# Patient Record
Sex: Female | Born: 1945 | Race: White | Hispanic: No | Marital: Married | Smoking: Never smoker
Health system: Southern US, Community
[De-identification: ages and names within clinical notes are randomized; demographics above are authoritative.]

## PROBLEM LIST (undated history)

## (undated) DIAGNOSIS — I10 Essential (primary) hypertension: Secondary | ICD-10-CM

## (undated) DIAGNOSIS — C4490 Unspecified malignant neoplasm of skin, unspecified: Secondary | ICD-10-CM

## (undated) DIAGNOSIS — M1990 Unspecified osteoarthritis, unspecified site: Secondary | ICD-10-CM

## (undated) DIAGNOSIS — Z9889 Other specified postprocedural states: Secondary | ICD-10-CM

## (undated) DIAGNOSIS — F419 Anxiety disorder, unspecified: Secondary | ICD-10-CM

## (undated) DIAGNOSIS — E119 Type 2 diabetes mellitus without complications: Secondary | ICD-10-CM

## (undated) DIAGNOSIS — E7800 Pure hypercholesterolemia, unspecified: Secondary | ICD-10-CM

## (undated) DIAGNOSIS — R112 Nausea with vomiting, unspecified: Secondary | ICD-10-CM

## (undated) HISTORY — PX: SHOULDER SURGERY: SHX246

## (undated) HISTORY — DX: Essential (primary) hypertension: I10

## (undated) HISTORY — PX: NECK SURGERY: SHX720

## (undated) HISTORY — PX: ABDOMINAL HYSTERECTOMY: SHX81

## (undated) HISTORY — PX: BREAST BIOPSY: SHX20

## (undated) HISTORY — PX: BACK SURGERY: SHX140

## (undated) HISTORY — DX: Anxiety disorder, unspecified: F41.9

## (undated) HISTORY — DX: Pure hypercholesterolemia, unspecified: E78.00

## (undated) HISTORY — DX: Type 2 diabetes mellitus without complications: E11.9

---

## 1997-08-08 ENCOUNTER — Other Ambulatory Visit: Admission: RE | Admit: 1997-08-08 | Discharge: 1997-08-08 | Payer: Self-pay | Admitting: Obstetrics and Gynecology

## 1998-02-06 ENCOUNTER — Encounter: Admission: RE | Admit: 1998-02-06 | Discharge: 1998-05-07 | Payer: Self-pay | Admitting: *Deleted

## 1998-08-11 ENCOUNTER — Other Ambulatory Visit: Admission: RE | Admit: 1998-08-11 | Discharge: 1998-08-11 | Payer: Self-pay | Admitting: Obstetrics and Gynecology

## 1999-02-06 ENCOUNTER — Encounter: Admission: RE | Admit: 1999-02-06 | Discharge: 1999-02-06 | Payer: Self-pay | Admitting: General Surgery

## 1999-02-06 ENCOUNTER — Encounter: Payer: Self-pay | Admitting: General Surgery

## 1999-02-18 ENCOUNTER — Encounter: Admission: RE | Admit: 1999-02-18 | Discharge: 1999-03-11 | Payer: Self-pay | Admitting: Orthopedic Surgery

## 1999-04-26 ENCOUNTER — Encounter: Payer: Self-pay | Admitting: Emergency Medicine

## 1999-04-26 ENCOUNTER — Emergency Department (HOSPITAL_COMMUNITY): Admission: EM | Admit: 1999-04-26 | Discharge: 1999-04-26 | Payer: Self-pay | Admitting: Emergency Medicine

## 1999-08-26 ENCOUNTER — Other Ambulatory Visit: Admission: RE | Admit: 1999-08-26 | Discharge: 1999-08-26 | Payer: Self-pay | Admitting: Obstetrics and Gynecology

## 2000-02-08 ENCOUNTER — Encounter: Admission: RE | Admit: 2000-02-08 | Discharge: 2000-02-08 | Payer: Self-pay | Admitting: General Surgery

## 2000-02-08 ENCOUNTER — Encounter: Payer: Self-pay | Admitting: General Surgery

## 2000-02-23 ENCOUNTER — Encounter: Admission: RE | Admit: 2000-02-23 | Discharge: 2000-02-23 | Payer: Self-pay | Admitting: General Surgery

## 2000-02-23 ENCOUNTER — Encounter: Payer: Self-pay | Admitting: General Surgery

## 2000-10-28 ENCOUNTER — Other Ambulatory Visit: Admission: RE | Admit: 2000-10-28 | Discharge: 2000-10-28 | Payer: Self-pay | Admitting: Obstetrics and Gynecology

## 2001-02-13 ENCOUNTER — Encounter: Payer: Self-pay | Admitting: General Surgery

## 2001-02-13 ENCOUNTER — Encounter: Admission: RE | Admit: 2001-02-13 | Discharge: 2001-02-13 | Payer: Self-pay | Admitting: General Surgery

## 2001-09-05 ENCOUNTER — Other Ambulatory Visit: Admission: RE | Admit: 2001-09-05 | Discharge: 2001-09-05 | Payer: Self-pay | Admitting: Obstetrics and Gynecology

## 2002-03-05 ENCOUNTER — Encounter: Admission: RE | Admit: 2002-03-05 | Discharge: 2002-03-05 | Payer: Self-pay | Admitting: General Surgery

## 2002-03-05 ENCOUNTER — Encounter: Payer: Self-pay | Admitting: General Surgery

## 2002-09-11 ENCOUNTER — Other Ambulatory Visit: Admission: RE | Admit: 2002-09-11 | Discharge: 2002-09-11 | Payer: Self-pay | Admitting: Obstetrics and Gynecology

## 2003-09-12 ENCOUNTER — Other Ambulatory Visit: Admission: RE | Admit: 2003-09-12 | Discharge: 2003-09-12 | Payer: Self-pay | Admitting: Obstetrics and Gynecology

## 2003-09-24 ENCOUNTER — Encounter: Admission: RE | Admit: 2003-09-24 | Discharge: 2003-09-24 | Payer: Self-pay | Admitting: Obstetrics and Gynecology

## 2004-09-16 ENCOUNTER — Other Ambulatory Visit: Admission: RE | Admit: 2004-09-16 | Discharge: 2004-09-16 | Payer: Self-pay | Admitting: Obstetrics and Gynecology

## 2004-10-12 ENCOUNTER — Encounter: Admission: RE | Admit: 2004-10-12 | Discharge: 2004-10-12 | Payer: Self-pay | Admitting: Obstetrics and Gynecology

## 2004-12-22 ENCOUNTER — Inpatient Hospital Stay (HOSPITAL_BASED_OUTPATIENT_CLINIC_OR_DEPARTMENT_OTHER): Admission: RE | Admit: 2004-12-22 | Discharge: 2004-12-22 | Payer: Self-pay | Admitting: Cardiology

## 2005-08-29 ENCOUNTER — Emergency Department (HOSPITAL_COMMUNITY): Admission: EM | Admit: 2005-08-29 | Discharge: 2005-08-29 | Payer: Self-pay | Admitting: Emergency Medicine

## 2005-09-24 ENCOUNTER — Other Ambulatory Visit: Admission: RE | Admit: 2005-09-24 | Discharge: 2005-09-24 | Payer: Self-pay | Admitting: Obstetrics and Gynecology

## 2005-11-18 ENCOUNTER — Encounter: Admission: RE | Admit: 2005-11-18 | Discharge: 2005-11-18 | Payer: Self-pay | Admitting: General Surgery

## 2006-04-20 ENCOUNTER — Ambulatory Visit (HOSPITAL_COMMUNITY): Admission: RE | Admit: 2006-04-20 | Discharge: 2006-04-21 | Payer: Self-pay | Admitting: Obstetrics and Gynecology

## 2006-04-22 ENCOUNTER — Inpatient Hospital Stay (HOSPITAL_COMMUNITY): Admission: AD | Admit: 2006-04-22 | Discharge: 2006-04-22 | Payer: Self-pay | Admitting: Obstetrics and Gynecology

## 2007-06-07 ENCOUNTER — Encounter: Admission: RE | Admit: 2007-06-07 | Discharge: 2007-06-07 | Payer: Self-pay | Admitting: Internal Medicine

## 2007-07-31 ENCOUNTER — Emergency Department (HOSPITAL_COMMUNITY): Admission: EM | Admit: 2007-07-31 | Discharge: 2007-07-31 | Payer: Self-pay | Admitting: Emergency Medicine

## 2008-11-28 ENCOUNTER — Encounter: Admission: RE | Admit: 2008-11-28 | Discharge: 2008-11-28 | Payer: Self-pay | Admitting: Internal Medicine

## 2010-01-19 ENCOUNTER — Encounter: Admission: RE | Admit: 2010-01-19 | Discharge: 2010-01-19 | Payer: Self-pay | Admitting: Internal Medicine

## 2010-08-21 NOTE — Op Note (Signed)
[name], Virginia Russell             ACCOUNT NO.:  [id number]   MEDICAL RECORD NO.:  [id number]          PATIENT TYPE:  AMB   LOCATION:  SDC                           FACILITY:  [facility]   PHYSICIAN:  Dois Davenport A. Rivard, M.D. DATE OF BIRTH:  [birth year]   DATE OF PROCEDURE:  04/20/2006  DATE OF DISCHARGE:                               OPERATIVE REPORT   PREOPERATIVE DIAGNOSIS:  Cystocele with urinary stress incontinence.   POSTOPERATIVE DIAGNOSIS:  Cystocele with urinary stress incontinence.   ANESTHESIA:  General.   PROCEDURE:  Anterior repair with urethropexy of Kelly-Kennedy.   SURGEON:  Crist Fat. Rivard, M.D.   ASSISTANTMarquis Lunch. Powell. P.A.   Bronwen Betters BLOOD LOSS:  Less than 100 mL.   PROCEDURE:  After being informed of the planned procedure with possible  complications including bleeding, infection, injury to bladder or  ureters, informed consent was obtained.  The patient is taken to OR #3,  given general anesthesia with endotracheal intubation without  complication.  She is placed in the lithotomy position, prepped and  draped in a sterile fashion, and a Foley catheter is inserted in her  bladder.  GYN exam reveals a complete cystocele, no rectocele, no  enterocele, and no colpocele.  The vaginal vault is grasped with Allis  forceps and the anterior vaginal mucosa is infiltrated with lidocaine  1%, epinephrine 1:200,000, 20 mL.  We can now incise the vaginal mucosa  transversely with knife.  Each edges is grasped with Allis forceps, and  we now undermine the anterior vaginal wall from the prevesical fascia  using Strully scissors in the medial fashion up until 1 cm from the  urethra.  The edges of the vaginal mucosa are held with Allis forceps,  which allows [place] now to sharply and bluntly dissect the prevesical fascia  from the anterior vaginal wall.  This is done until the cystocele is  completely freed.  Using 2-0 Vicryl, we can now plicate under the  urethra with two   Kelly-Kennedy sutures, correcting the urethrovesical  angle to a 90-degree.  We then correct the cystocele in multiple layers  using U stitches of 2-0 Vicryl.  This is done until the whole cystocele  was completely corrected.  Excessive vaginal mucosa is excised and the  vaginal mucosa is then closed with figure-of-eight stitches of 3-0  Vicryl.  The vagina is then packed with two vaginal packs and Estrace  cream.  Instrument and sponge count is complete x2.  Estimated blood  loss is less than 100 mL.  The procedure is very well-tolerated by the  patient, who is taken to recovery room in a well and stable condition.      Crist Fat Rivard, M.D.  Electronically Signed     SAR/MEDQ  D:  04/20/2006  T:  04/20/2006  Job:  [id number]

## 2010-08-21 NOTE — H&P (Signed)
Virginia Russell, Virginia Russell             ACCOUNT NO.:  [id number]   MEDICAL RECORD NO.:  [id number]          PATIENT TYPE:  AMB   LOCATION:  SDC                           [facility]ILITY:  [facility]   PHYSICIAN:  Dois Davenport A. Rivard, M.D. DATE OF BIRTH:  April 18, 1945   DATE OF ADMISSION:  04/20/2006  DATE OF DISCHARGE:                              HISTORY & PHYSICAL   REASON FOR ADMISSION:  Symptomatic cystocele.   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  She also reports occasional incomplete emptying as well as  nycturia varying from 2-3 episodes per night.  She denies any dysuria or  hematuria.   Her last Pap smear in June 2007, was normal.  Her last mammogram in  November 2007, was normal.  A bone density in June 2007, revealed  osteopenia.   CURRENT MEDICATIONS:  1. Metformin 750 mg b.i.d.  2. Celebrex 200 mg p.o. daily.  3. Effexor 37.5 mg daily.  4. Lipitor daily.  5. Diovan 80 mg daily.  6. Premarin 0.625 mg daily.   ALLERGIES:  CODEINE with GI upset.   REVIEW OF SYSTEMS:  CONSTITUTIONAL:  Negative.  HEENT:  Negative.  CARDIOVASCULAR:  Negative.  GENITOURINARY:  Please see HPI.  GASTROINTESTINAL:  Negative.  NEUROLOGICAL:  Negative.   PAST MEDICAL HISTORY:  1. Gravida 3, para 3, status post three spontaneous vaginal      deliveries.  2. Total abdominal hysterectomy in 1978 for fibroids.  3. Breast lump biopsy, benign.  4. Back surgery x2.  5. Neck surgery.  6. Hypercholesterolemia.  7. Diabetes.  8. Hypertension.   SOCIAL HISTORY:  Married, nonsmoker.  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   FAMILY HISTORY:  Mother is a survivor of breast  cancer.   PHYSICAL EXAMINATION:  VITAL SIGNS:  Current weight is 152 pounds for a  height of 5 feet 3/4 inches.  Blood pressure 134/92.  HEENT:  Normal.  NECK:  Thyroid not enlarged.  CHEST:  Clear.  CARDIOVASCULAR:  Regular rate and rhythm.  BREASTS:  Normal.  BACK:  No CVA tenderness.  ABDOMEN:  No tenderness, masses, or hepatosplenomegaly.  EXTREMITIES:  Negative.  NEUROLOGIC:  Within normal limits.  GYNECOLOGICAL:  Normal external genitalia.  Vaginal exam reveals a  complete cystocele 4/4 with a negative stress test.  Absence of  colpocele, absence of rectocele, absence of enterocele.  Adnexa are felt  and negative.  Rectal exam is negative.   ASSESSMENT:  Symptomatic cystocele in patient desiring surgical  correction.   PLAN:  The patient is scheduled for an anterior repair on April 20, 2006.  The procedure has been thoroughly reviewed with the patient as  well as possible complications including, but not limited to infection,  bleeding, injury to other organs such as bladder or ureters  as well as  postoperative urinary retention or postoperative urinary incontinence.  Postoperative course as well as hospital stay has been reviewed and the  patient is aware she may have to go home with an indwelling Foley  catheter.  She voices understanding.      Crist Fat Rivard, M.D.  Electronically Signed     SAR/MEDQ  D:  04/15/2006  T:  04/15/2006  Job:  [id number]

## 2010-08-21 NOTE — Cardiovascular Report (Signed)
[name], Virginia Russell             ACCOUNT NO.:  [id number]   MEDICAL RECORD NO.:  [id number]          PATIENT TYPE:  OIB   LOCATION:  1961                         FACILITY:  [facility]   PHYSICIAN:  Colleen Can. Deborah Chalk, M.D.DATE OF BIRTH:  Sep 17, 1945   DATE OF PROCEDURE:  12/22/2004  DATE OF DISCHARGE:                              CARDIAC CATHETERIZATION   PROCEDURE:  Left heart catheterization with selective coronary angiography  and left ventricular angiography.   TYPE AND SITE OF ENTRY:  Percutaneous right femoral artery.   CATHETERS:  1.  4-French 4-curved Judkins right and left coronary catheters.  2.  4-French pigtail ventriculographic catheter.   CONTRAST MATERIAL:  Omnipaque.   MEDICATIONS GIVEN PRIOR TO PROCEDURE:  Valium 10 mg.   MEDICATIONS GIVING DURING THE PROCEDURE:  None.   COMMENTS:  The patient tolerated the procedure well.   HEMODYNAMIC DATA:  1.  Aortic pressure:  171/89.  2.  LV pressure:  182/20. After rebalancing, end-diastolic pressure fell to      12-18.   ANGIOGRAPHIC DATA:  1.  LEFT MAIN CORONARY ARTERY:  Normal.  2.  LEFT ANTERIOR DESCENDING:  The left anterior descending is a small to      moderate size vessel that ends on the anterior wall. There is a large      diagonal vessel. There was 30-40% narrowing leading up to the      bifurcation. After bifurcation there was at least  50-60% narrowing.      There does not appear to be any area that is more severe in the      segmental disease than 50-60% luminal reduction,  although it is a      lesion that is over length.  3.  LEFT CIRCUMFLEX: The left circumflex is a moderate size system. It      continues as a high obtuse marginal and a bifurcating second obtuse      marginal. It is essentially normal.  4.  RIGHT CORONARY ARTERY: The right coronary artery is a large, dominant      system. It has minimal irregularities. It has three large branches on      the inferior wall.   LEFT VENTRICULAR  ANGIOGRAM:  Performed in the RAO position. The overall  cardiac size and silhouette are normal. The global ejection fraction was  60%. Regional wall motion appears to be normal. There was no mitral  regurgitation.   OVERALL IMPRESSION:  1.  Normal left ventricular function, but with elevated end systolic      pressures.  2.  Moderate coronary atherosclerosis in the left anterior descending, in a      segmental nature; with minimal coronary atherosclerosis otherwise.  3.  No detectable gradient across the aortic valve.   DISCUSSION:  Management issues for now probably will best stem around  measures to reduce left ventricular filling pressures. There does not appear  to be significant obstructive disease that would warrant either percutaneous  intervention or bypass surgery.      Colleen Can. Deborah Chalk, M.D.  Electronically Signed     SNT/MEDQ  D:  12/22/2004  T:  12/22/2004  Job:  [id number]

## 2011-02-18 ENCOUNTER — Other Ambulatory Visit: Payer: Self-pay | Admitting: Internal Medicine

## 2011-02-18 DIAGNOSIS — Z1231 Encounter for screening mammogram for malignant neoplasm of breast: Secondary | ICD-10-CM

## 2011-03-03 ENCOUNTER — Ambulatory Visit
Admission: RE | Admit: 2011-03-03 | Discharge: 2011-03-03 | Disposition: A | Discharge status: Home / Self Care | Payer: Medicare Other | Source: Ambulatory Visit | Attending: Internal Medicine | Admitting: Internal Medicine

## 2011-03-03 DIAGNOSIS — Z1231 Encounter for screening mammogram for malignant neoplasm of breast: Secondary | ICD-10-CM

## 2012-02-21 ENCOUNTER — Other Ambulatory Visit: Payer: Self-pay | Admitting: Internal Medicine

## 2012-02-21 DIAGNOSIS — Z1231 Encounter for screening mammogram for malignant neoplasm of breast: Secondary | ICD-10-CM

## 2012-03-30 ENCOUNTER — Ambulatory Visit: Payer: Medicare Other

## 2012-10-31 ENCOUNTER — Ambulatory Visit
Admission: RE | Admit: 2012-10-31 | Discharge: 2012-10-31 | Disposition: A | Discharge status: Home / Self Care | Payer: Medicare Other | Source: Ambulatory Visit | Attending: Internal Medicine | Admitting: Internal Medicine

## 2012-10-31 DIAGNOSIS — Z1231 Encounter for screening mammogram for malignant neoplasm of breast: Secondary | ICD-10-CM

## 2012-12-28 ENCOUNTER — Encounter: Payer: Self-pay | Admitting: Gynecology

## 2012-12-28 ENCOUNTER — Other Ambulatory Visit (HOSPITAL_COMMUNITY)
Admission: RE | Admit: 2012-12-28 | Discharge: 2012-12-28 | Disposition: A | Discharge status: Home / Self Care | Payer: Medicare Other | Source: Ambulatory Visit | Attending: Gynecology | Admitting: Gynecology

## 2012-12-28 ENCOUNTER — Ambulatory Visit (INDEPENDENT_AMBULATORY_CARE_PROVIDER_SITE_OTHER): Payer: Medicare Other | Admitting: Gynecology

## 2012-12-28 VITALS — BP 126/78 | Ht 60.5 in | Wt 142.0 lb

## 2012-12-28 DIAGNOSIS — N950 Postmenopausal bleeding: Secondary | ICD-10-CM

## 2012-12-28 DIAGNOSIS — Z1151 Encounter for screening for human papillomavirus (HPV): Secondary | ICD-10-CM | POA: Insufficient documentation

## 2012-12-28 DIAGNOSIS — Z01419 Encounter for gynecological examination (general) (routine) without abnormal findings: Secondary | ICD-10-CM | POA: Insufficient documentation

## 2012-12-28 DIAGNOSIS — N951 Menopausal and female climacteric states: Secondary | ICD-10-CM

## 2012-12-28 DIAGNOSIS — IMO0002 Reserved for concepts with insufficient information to code with codable children: Secondary | ICD-10-CM

## 2012-12-28 DIAGNOSIS — Z1272 Encounter for screening for malignant neoplasm of vagina: Secondary | ICD-10-CM

## 2012-12-28 DIAGNOSIS — N952 Postmenopausal atrophic vaginitis: Secondary | ICD-10-CM

## 2012-12-28 DIAGNOSIS — Z780 Asymptomatic menopausal state: Secondary | ICD-10-CM

## 2012-12-28 NOTE — Progress Notes (Signed)
Virginia Russell [facility] 1945/04/21 [id number]   History:    67 y.o.  Who is a new patient to the practice. Patient stated that for the past several weeks at sporadic times when she wiped she is noted a pinkish red like blood and she's wiped. The patient stated that many years ago she had a total abdominal hysterectomy with ovarian conservation for symptomatic leiomyomatous uteri. Also approximately 5 years ago when she had her last gynecological examination she had a suburethral sling for stress incontinence. Patient states that she has not been able to have intercourse over the past year because of discomfort dryness and irritation and pain.  The patient states she had a normal colonoscopy 2 years ago and a normal mammogram this year. She has not had a bone density study done as of yet. Patient denies any prior history of abnormal Pap smears. Patient stated that she took oral estrogen for 15 years and discontinued it a urine half ago. She denies any vasomotor symptoms.  Past medical history,surgical history, family history and social history were all reviewed and documented in the EPIC chart.  Gynecologic History No LMP recorded. Patient has had a hysterectomy. Contraception: status post hysterectomy Last Pap: over 5 years ago. Results were: normal Last mammogram: 2014. Results were: normal  Obstetric History OB History  Gravida Para Term Preterm AB SAB TAB Ectopic Multiple Living  3 3        3     # Outcome Date GA Lbr Len/2nd Weight Sex Delivery Anes PTL Lv  3 PAR           2 PAR           1 PAR                ROS: A ROS was performed and pertinent positives and negatives are included in the history.  GENERAL: No fevers or chills. HEENT: No change in vision, no earache, sore throat or sinus congestion. NECK: No pain or stiffness. CARDIOVASCULAR: No chest pain or pressure. No palpitations. PULMONARY: No shortness of breath, cough or wheeze. GASTROINTESTINAL: No abdominal pain, nausea, vomiting  or diarrhea, melena or bright red blood per rectum. GENITOURINARY: No urinary frequency, urgency, hesitancy or dysuria. MUSCULOSKELETAL: No joint or muscle pain, no back pain, no recent trauma. DERMATOLOGIC: No rash, no itching, no lesions. ENDOCRINE: No polyuria, polydipsia, no heat or cold intolerance. No recent change in weight. HEMATOLOGICAL: No anemia or easy bruising or bleeding. NEUROLOGIC: No headache, seizures, numbness, tingling or weakness. PSYCHIATRIC: No depression, no loss of interest in normal activity or change in sleep pattern.     Exam: chaperone present  BP 126/78  Ht 5' 0.5" (1.537 m)  Wt 142 lb (64.411 kg)  BMI 27.27 kg/m2  Body mass index is 27.27 kg/(m^2).  General appearance : Well developed well nourished female. No acute distress HEENT: Neck supple, trachea midline, no carotid bruits, no thyroidmegaly Lungs: Clear to auscultation, no rhonchi or wheezes, or rib retractions  Heart: Regular rate and rhythm, no murmurs or gallops Breast:Examined in sitting and supine position were symmetrical in appearance, no palpable masses or tenderness,  no skin retraction, no nipple inversion, no nipple discharge, no skin discoloration, no axillary or supraclavicular lymphadenopathy Abdomen: no palpable masses or tenderness, no rebound or guarding Extremities: no edema or skin discoloration or tenderness  Pelvic:  Bartholin, Urethra, Skene Glands: Within normal limits             Vagina: No gross lesions or  discharge,atrophic and friable  Cervix:absent  Uterus  absent  Adnexa  Without masses or tenderness  Anus and perineum  normal   Rectovaginal  normal sphincter tone without palpated masses or tenderness             Hemoccult will provide at next office visit     Assessment/Plan:  67 y.o. female with several isolated evidence of pinkish discharge vaginally. The patient has not been sexually active for a year. 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 No lesions seen. Pap smear done today. We discussed offering patient Osphena for vaginal atrophy and dyspareunia 60 mg daily. The risks benefits and pros and cons were discussed. There is no contraindication for her to receive this medication. We'll wait for the results of the Pap smear and then start. She was given one month's supply sample to try and to return to see me in one month for followup. Patient also will be returning the same day for a bone density study. We discussed about the importance of calcium and vitamin D for osteoporosis prevention. Her Tdap vaccine as of today. She was given a prescription for shingles vaccine. When she returns back in one month she will be provided with Hemoccult cards to submit to the office at a later date for testing and will also recommend that she have a hepatitis C screen since she was born between [birth year] and 1966 per CDC guidelines.    Ok Edwards MD, 2:43 PM 12/28/2012

## 2012-12-28 NOTE — Addendum Note (Signed)
Addended by: Bertram Savin A on: 12/28/2012 03:37 PM   Modules accepted: Orders

## 2012-12-28 NOTE — Patient Instructions (Addendum)
Shingles Vaccine What You Need to Know WHAT IS SHINGLES?  Shingles is a painful skin rash, often with blisters. It is also called Herpes Zoster or just Zoster.  A shingles rash usually appears on one side of the face or body and lasts from 2 to 4 weeks. Its main symptom is pain, which can be quite severe. Other symptoms of shingles can include fever, headache, chills, and upset stomach. Very rarely, a shingles infection can lead to pneumonia, hearing problems, blindness, brain inflammation (encephalitis), or death.  For about 1 person in 5, severe pain can continue even after the rash clears up. This is called post-herpetic neuralgia.  Shingles is caused by the Varicella Zoster virus. This is the same virus that causes chickenpox. Only someone who has had a case of chickenpox or rarely, has gotten chickenpox vaccine, can get shingles. The virus stays in your body. It can reappear many years later to cause a case of shingles.  You cannot catch shingles from another person with shingles. However, a person who has never had chickenpox (or chickenpox vaccine) could get chickenpox from someone with shingles. This is not very common.  Shingles is far more common in people 50 and older than in younger people. It is also more common in people whose immune systems are weakened because of a disease such as cancer or drugs such as steroids or chemotherapy.  At least 1 million people get shingles per year in the [place]. SHINGLES VACCINE  A vaccine for shingles was licensed in 2006. In clinical trials, the vaccine reduced the risk of shingles by 50%. It can also reduce the pain in people who still get shingles after being vaccinated.  A single dose of shingles vaccine is recommended for adults 60 years of age and older. SOME PEOPLE SHOULD NOT GET SHINGLES VACCINE OR SHOULD WAIT A person should not get shingles vaccine if he or she:  Has ever had a life-threatening allergic reaction to gelatin, the  antibiotic neomycin, or any other component of shingles vaccine. Tell your caregiver if you have any severe allergies.  Has a weakened immune system because of current:  AIDS or another disease that affects the immune system.  Treatment with drugs that affect the immune system, such as prolonged use of high-dose steroids.  Cancer treatment, such as radiation or chemotherapy.  Cancer affecting the bone marrow or lymphatic system, such as leukemia or lymphoma.  Is pregnant, or might be pregnant. Women should not become pregnant until at least 4 weeks after getting shingles vaccine. Someone with a minor illness, such as a cold, may be vaccinated. Anyone with a moderate or severe acute illness should usually wait until he or she recovers before getting the vaccine. This includes anyone with a temperature of 101.3 F (38 C) or higher. WHAT ARE THE RISKS FROM SHINGLES VACCINE?  A vaccine, like any medicine, could possibly cause serious problems, such as severe allergic reactions. However, the risk of a vaccine causing serious harm, or death, is extremely small.  No serious problems have been identified with shingles vaccine. Mild Problems  Redness, soreness, swelling, or itching at the site of the injection (about 1 person in 3).  Headache (about 1 person in 70). Like all vaccines, shingles vaccine is being closely monitored for unusual or severe problems. WHAT IF THERE IS A MODERATE OR SEVERE REACTION? What should I look for? Any unusual condition, such as a severe allergic reaction or a high fever. If a severe allergic reaction   occurred, it would be within a few minutes to an hour after the shot. Signs of a serious allergic reaction can include difficulty breathing, weakness, hoarseness or wheezing, a fast heartbeat, hives, dizziness, paleness, or swelling of the throat. What should I do?  Call your caregiver, or get the person to a caregiver right away.  Tell the caregiver what  happened, the date and time it happened, and when the vaccination was given.  Ask the caregiver to report the reaction by filing a Vaccine Adverse Event Reporting System (VAERS) form. Or, you can file this report through the VAERS web site at www.vaers.LAgents.no or by calling 1-928-833-5927. VAERS does not provide medical advice. HOW CAN I LEARN MORE?  Ask your caregiver. He or she can give you the vaccine package insert or suggest other sources of information.  Contact the Centers for Disease Control and Prevention (CDC):  Call 424-256-0204 (1-800-CDC-INFO).  Visit the CDC website at PicCapture.uy CDC Shingles Vaccine VIS (01/09/08) Document Released: 01/17/2006 Document Revised: 06/14/2011 Document Reviewed: 01/09/2008 [employer] Patient Information 2014 [place], [place]. Bone Densitometry Bone densitometry is a special X-ray that measures your bone density and can be used to help predict your risk of bone fractures. This test is used to determine bone mineral content and density to diagnose osteoporosis. Osteoporosis is the loss of bone that may cause the bone to become weak. Osteoporosis commonly occurs in women entering menopause. However, it may be found in men and in people with other diseases. PREPARATION FOR TEST No preparation necessary. WHO SHOULD BE TESTED? All women older than 38. Postmenopausal women (50 to 62) with risk factors for osteoporosis. People with a previous fracture caused by normal activities. People with a small body frame (less than 127 poundsor a body mass index [BMI] of less than 21). People who have a parent with a hip fracture or history of osteoporosis. People who smoke. People who have rheumatoid arthritis. Anyone who engages in excessive alcohol use (more than 3 drinks most days). Women who experience early menopause. WHEN SHOULD YOU BE RETESTED? Current guidelines suggest that you should wait at least 2 years before doing a bone density test  again if your first test was normal.Recent studies indicated that women with normal bone density may be able to wait a few years before needing to repeat a bone density test. You should discuss this with your caregiver.  NORMAL FINDINGS  Normal: less than standard deviation below normal (greater than -1). Osteopenia: 1 to 2.5 standard deviations below normal (-1 to -2.5). Osteoporosis: greater than 2.5 standard deviations below normal (less than -2.5). Test results are reported as a "T score" and a "Z score."The T score is a number that compares your bone density with the bone density of healthy, young women.The Z score is a number that compares your bone density with the scores of women who are the same age, gender, and race.  Ranges for normal findings may vary among different laboratories and hospitals. You should always check with your doctor after having lab work or other tests done to discuss the meaning of your test results and whether your values are considered within normal limits. MEANING OF TEST  Your caregiver will go over the test results with you and discuss the importance and meaning of your results, as well as treatment options and the need for additional tests if necessary. OBTAINING THE TEST RESULTS It is your responsibility to obtain your test results. Ask the lab or department performing the test when and  how you will get your results. Document Released: 04/13/2004 Document Revised: 06/14/2011 Document Reviewed: 05/06/2010 [facility] Patient Information 2014 [place], [place].

## 2013-02-20 ENCOUNTER — Ambulatory Visit: Payer: Medicare Other | Admitting: Gynecology

## 2014-02-04 ENCOUNTER — Encounter: Payer: Self-pay | Admitting: Gynecology

## 2014-04-10 ENCOUNTER — Other Ambulatory Visit: Payer: Self-pay

## 2014-04-10 DIAGNOSIS — Z1231 Encounter for screening mammogram for malignant neoplasm of breast: Secondary | ICD-10-CM

## 2014-04-22 ENCOUNTER — Ambulatory Visit
Admission: RE | Admit: 2014-04-22 | Discharge: 2014-04-22 | Disposition: A | Discharge status: Home / Self Care | Payer: Commercial Managed Care - HMO | Source: Ambulatory Visit

## 2014-04-22 DIAGNOSIS — Z1231 Encounter for screening mammogram for malignant neoplasm of breast: Secondary | ICD-10-CM

## 2014-05-28 DIAGNOSIS — E1165 Type 2 diabetes mellitus with hyperglycemia: Secondary | ICD-10-CM | POA: Diagnosis not present

## 2014-05-28 DIAGNOSIS — Z1389 Encounter for screening for other disorder: Secondary | ICD-10-CM | POA: Diagnosis not present

## 2014-05-28 DIAGNOSIS — Z79899 Other long term (current) drug therapy: Secondary | ICD-10-CM | POA: Diagnosis not present

## 2014-05-28 DIAGNOSIS — I10 Essential (primary) hypertension: Secondary | ICD-10-CM | POA: Diagnosis not present

## 2014-05-28 DIAGNOSIS — E780 Pure hypercholesterolemia: Secondary | ICD-10-CM | POA: Diagnosis not present

## 2014-05-29 DIAGNOSIS — I10 Essential (primary) hypertension: Secondary | ICD-10-CM | POA: Diagnosis not present

## 2014-05-29 DIAGNOSIS — Z79899 Other long term (current) drug therapy: Secondary | ICD-10-CM | POA: Diagnosis not present

## 2014-05-29 DIAGNOSIS — E780 Pure hypercholesterolemia: Secondary | ICD-10-CM | POA: Diagnosis not present

## 2014-05-29 DIAGNOSIS — E1165 Type 2 diabetes mellitus with hyperglycemia: Secondary | ICD-10-CM | POA: Diagnosis not present

## 2014-08-27 DIAGNOSIS — E780 Pure hypercholesterolemia: Secondary | ICD-10-CM | POA: Diagnosis not present

## 2014-08-27 DIAGNOSIS — E1165 Type 2 diabetes mellitus with hyperglycemia: Secondary | ICD-10-CM | POA: Diagnosis not present

## 2014-08-27 DIAGNOSIS — I10 Essential (primary) hypertension: Secondary | ICD-10-CM | POA: Diagnosis not present

## 2014-08-27 DIAGNOSIS — Z79899 Other long term (current) drug therapy: Secondary | ICD-10-CM | POA: Diagnosis not present

## 2014-11-26 DIAGNOSIS — E780 Pure hypercholesterolemia: Secondary | ICD-10-CM | POA: Diagnosis not present

## 2014-11-26 DIAGNOSIS — I10 Essential (primary) hypertension: Secondary | ICD-10-CM | POA: Diagnosis not present

## 2014-11-26 DIAGNOSIS — Z79899 Other long term (current) drug therapy: Secondary | ICD-10-CM | POA: Diagnosis not present

## 2014-11-26 DIAGNOSIS — E1165 Type 2 diabetes mellitus with hyperglycemia: Secondary | ICD-10-CM | POA: Diagnosis not present

## 2015-01-08 DIAGNOSIS — E1165 Type 2 diabetes mellitus with hyperglycemia: Secondary | ICD-10-CM | POA: Diagnosis not present

## 2015-01-08 DIAGNOSIS — Z23 Encounter for immunization: Secondary | ICD-10-CM | POA: Diagnosis not present

## 2015-01-08 DIAGNOSIS — Z79899 Other long term (current) drug therapy: Secondary | ICD-10-CM | POA: Diagnosis not present

## 2015-01-08 DIAGNOSIS — I10 Essential (primary) hypertension: Secondary | ICD-10-CM | POA: Diagnosis not present

## 2015-03-11 DIAGNOSIS — Z1231 Encounter for screening mammogram for malignant neoplasm of breast: Secondary | ICD-10-CM | POA: Diagnosis not present

## 2015-03-11 DIAGNOSIS — Z803 Family history of malignant neoplasm of breast: Secondary | ICD-10-CM | POA: Diagnosis not present

## 2015-03-11 DIAGNOSIS — E1165 Type 2 diabetes mellitus with hyperglycemia: Secondary | ICD-10-CM | POA: Diagnosis not present

## 2015-03-26 ENCOUNTER — Other Ambulatory Visit: Payer: Self-pay

## 2015-03-26 DIAGNOSIS — Z1231 Encounter for screening mammogram for malignant neoplasm of breast: Secondary | ICD-10-CM

## 2015-04-03 ENCOUNTER — Other Ambulatory Visit: Payer: Self-pay | Admitting: Nurse Practitioner

## 2015-04-03 ENCOUNTER — Other Ambulatory Visit: Payer: Self-pay | Admitting: Internal Medicine

## 2015-04-03 DIAGNOSIS — N644 Mastodynia: Secondary | ICD-10-CM

## 2015-04-08 ENCOUNTER — Ambulatory Visit
Admission: RE | Admit: 2015-04-08 | Discharge: 2015-04-08 | Disposition: A | Discharge status: Home / Self Care | Payer: Commercial Managed Care - HMO | Source: Ambulatory Visit | Attending: Internal Medicine | Admitting: Internal Medicine

## 2015-04-08 DIAGNOSIS — N644 Mastodynia: Secondary | ICD-10-CM

## 2015-04-25 ENCOUNTER — Ambulatory Visit: Payer: Self-pay

## 2015-06-11 ENCOUNTER — Other Ambulatory Visit: Payer: Self-pay | Admitting: Internal Medicine

## 2015-06-11 DIAGNOSIS — R413 Other amnesia: Secondary | ICD-10-CM

## 2015-06-13 ENCOUNTER — Other Ambulatory Visit: Payer: Self-pay

## 2015-06-13 ENCOUNTER — Ambulatory Visit
Admission: RE | Admit: 2015-06-13 | Discharge: 2015-06-13 | Disposition: A | Discharge status: Home / Self Care | Payer: Self-pay | Source: Ambulatory Visit | Attending: Internal Medicine | Admitting: Internal Medicine

## 2015-06-13 DIAGNOSIS — R55 Syncope and collapse: Secondary | ICD-10-CM | POA: Diagnosis not present

## 2015-06-13 DIAGNOSIS — R413 Other amnesia: Secondary | ICD-10-CM

## 2015-06-24 ENCOUNTER — Other Ambulatory Visit: Payer: Self-pay | Admitting: Nurse Practitioner

## 2015-06-24 DIAGNOSIS — R930 Abnormal findings on diagnostic imaging of skull and head, not elsewhere classified: Secondary | ICD-10-CM

## 2015-07-04 ENCOUNTER — Ambulatory Visit
Admission: RE | Admit: 2015-07-04 | Discharge: 2015-07-04 | Disposition: A | Discharge status: Home / Self Care | Payer: Commercial Managed Care - HMO | Source: Ambulatory Visit | Attending: Internal Medicine | Admitting: Internal Medicine

## 2015-07-04 DIAGNOSIS — R930 Abnormal findings on diagnostic imaging of skull and head, not elsewhere classified: Secondary | ICD-10-CM

## 2015-07-04 DIAGNOSIS — G9389 Other specified disorders of brain: Secondary | ICD-10-CM | POA: Diagnosis not present

## 2015-07-04 MED ORDER — GADOBENATE DIMEGLUMINE 529 MG/ML IV SOLN
14.0000 mL | Freq: Once | INTRAVENOUS | Status: AC | PRN
  Administered 2015-07-04: 14 mL via INTRAVENOUS

## 2015-07-16 DIAGNOSIS — I6523 Occlusion and stenosis of bilateral carotid arteries: Secondary | ICD-10-CM | POA: Diagnosis not present

## 2015-07-23 ENCOUNTER — Other Ambulatory Visit: Payer: Self-pay | Admitting: Neurological Surgery

## 2015-07-23 DIAGNOSIS — I6523 Occlusion and stenosis of bilateral carotid arteries: Secondary | ICD-10-CM

## 2015-07-30 ENCOUNTER — Ambulatory Visit
Admission: RE | Admit: 2015-07-30 | Discharge: 2015-07-30 | Disposition: A | Discharge status: Home / Self Care | Payer: Commercial Managed Care - HMO | Source: Ambulatory Visit | Attending: Neurological Surgery | Admitting: Neurological Surgery

## 2015-07-30 DIAGNOSIS — I6523 Occlusion and stenosis of bilateral carotid arteries: Secondary | ICD-10-CM | POA: Diagnosis not present

## 2015-08-06 DIAGNOSIS — Z6829 Body mass index (BMI) 29.0-29.9, adult: Secondary | ICD-10-CM | POA: Diagnosis not present

## 2015-08-06 DIAGNOSIS — M899 Disorder of bone, unspecified: Secondary | ICD-10-CM | POA: Diagnosis not present

## 2015-08-08 DIAGNOSIS — I10 Essential (primary) hypertension: Secondary | ICD-10-CM | POA: Diagnosis not present

## 2015-08-08 DIAGNOSIS — E1165 Type 2 diabetes mellitus with hyperglycemia: Secondary | ICD-10-CM | POA: Diagnosis not present

## 2016-01-07 ENCOUNTER — Other Ambulatory Visit: Payer: Self-pay | Admitting: Neurological Surgery

## 2016-01-07 DIAGNOSIS — M899 Disorder of bone, unspecified: Secondary | ICD-10-CM

## 2016-01-18 ENCOUNTER — Ambulatory Visit
Admission: RE | Admit: 2016-01-18 | Discharge: 2016-01-18 | Disposition: A | Discharge status: Home / Self Care | Payer: Commercial Managed Care - HMO | Source: Ambulatory Visit | Attending: Neurological Surgery | Admitting: Neurological Surgery

## 2016-01-18 DIAGNOSIS — M899 Disorder of bone, unspecified: Secondary | ICD-10-CM

## 2016-01-18 DIAGNOSIS — D329 Benign neoplasm of meninges, unspecified: Secondary | ICD-10-CM | POA: Diagnosis not present

## 2016-01-18 MED ORDER — GADOBENATE DIMEGLUMINE 529 MG/ML IV SOLN
13.0000 mL | Freq: Once | INTRAVENOUS | Status: AC | PRN
  Administered 2016-01-18: 13 mL via INTRAVENOUS

## 2016-02-11 DIAGNOSIS — Z6828 Body mass index (BMI) 28.0-28.9, adult: Secondary | ICD-10-CM | POA: Diagnosis not present

## 2016-02-11 DIAGNOSIS — I10 Essential (primary) hypertension: Secondary | ICD-10-CM | POA: Diagnosis not present

## 2016-02-11 DIAGNOSIS — M5412 Radiculopathy, cervical region: Secondary | ICD-10-CM | POA: Diagnosis not present

## 2016-02-16 DIAGNOSIS — I10 Essential (primary) hypertension: Secondary | ICD-10-CM | POA: Diagnosis not present

## 2016-02-16 DIAGNOSIS — E1165 Type 2 diabetes mellitus with hyperglycemia: Secondary | ICD-10-CM | POA: Diagnosis not present

## 2016-03-10 DIAGNOSIS — M5412 Radiculopathy, cervical region: Secondary | ICD-10-CM | POA: Diagnosis not present

## 2016-03-22 DIAGNOSIS — M542 Cervicalgia: Secondary | ICD-10-CM | POA: Diagnosis not present

## 2016-03-22 DIAGNOSIS — M79622 Pain in left upper arm: Secondary | ICD-10-CM | POA: Diagnosis not present

## 2016-03-22 DIAGNOSIS — M25612 Stiffness of left shoulder, not elsewhere classified: Secondary | ICD-10-CM | POA: Diagnosis not present

## 2016-03-22 DIAGNOSIS — M25512 Pain in left shoulder: Secondary | ICD-10-CM | POA: Diagnosis not present

## 2016-04-13 DIAGNOSIS — M25612 Stiffness of left shoulder, not elsewhere classified: Secondary | ICD-10-CM | POA: Diagnosis not present

## 2016-04-13 DIAGNOSIS — M25512 Pain in left shoulder: Secondary | ICD-10-CM | POA: Diagnosis not present

## 2016-04-13 DIAGNOSIS — M542 Cervicalgia: Secondary | ICD-10-CM | POA: Diagnosis not present

## 2016-04-13 DIAGNOSIS — M79622 Pain in left upper arm: Secondary | ICD-10-CM | POA: Diagnosis not present

## 2016-04-15 DIAGNOSIS — M542 Cervicalgia: Secondary | ICD-10-CM | POA: Diagnosis not present

## 2016-04-15 DIAGNOSIS — M25612 Stiffness of left shoulder, not elsewhere classified: Secondary | ICD-10-CM | POA: Diagnosis not present

## 2016-04-15 DIAGNOSIS — M79622 Pain in left upper arm: Secondary | ICD-10-CM | POA: Diagnosis not present

## 2016-04-15 DIAGNOSIS — M25512 Pain in left shoulder: Secondary | ICD-10-CM | POA: Diagnosis not present

## 2016-04-19 DIAGNOSIS — M25612 Stiffness of left shoulder, not elsewhere classified: Secondary | ICD-10-CM | POA: Diagnosis not present

## 2016-04-19 DIAGNOSIS — M25512 Pain in left shoulder: Secondary | ICD-10-CM | POA: Diagnosis not present

## 2016-04-19 DIAGNOSIS — M542 Cervicalgia: Secondary | ICD-10-CM | POA: Diagnosis not present

## 2016-04-19 DIAGNOSIS — M79622 Pain in left upper arm: Secondary | ICD-10-CM | POA: Diagnosis not present

## 2016-05-03 DIAGNOSIS — M542 Cervicalgia: Secondary | ICD-10-CM | POA: Diagnosis not present

## 2016-05-03 DIAGNOSIS — M25512 Pain in left shoulder: Secondary | ICD-10-CM | POA: Diagnosis not present

## 2016-05-03 DIAGNOSIS — M79622 Pain in left upper arm: Secondary | ICD-10-CM | POA: Diagnosis not present

## 2016-05-03 DIAGNOSIS — M25612 Stiffness of left shoulder, not elsewhere classified: Secondary | ICD-10-CM | POA: Diagnosis not present

## 2016-05-11 DIAGNOSIS — M79622 Pain in left upper arm: Secondary | ICD-10-CM | POA: Diagnosis not present

## 2016-05-11 DIAGNOSIS — M25612 Stiffness of left shoulder, not elsewhere classified: Secondary | ICD-10-CM | POA: Diagnosis not present

## 2016-05-11 DIAGNOSIS — M542 Cervicalgia: Secondary | ICD-10-CM | POA: Diagnosis not present

## 2016-05-11 DIAGNOSIS — M25512 Pain in left shoulder: Secondary | ICD-10-CM | POA: Diagnosis not present

## 2016-05-28 DIAGNOSIS — M25612 Stiffness of left shoulder, not elsewhere classified: Secondary | ICD-10-CM | POA: Diagnosis not present

## 2016-05-28 DIAGNOSIS — M25512 Pain in left shoulder: Secondary | ICD-10-CM | POA: Diagnosis not present

## 2016-05-28 DIAGNOSIS — M79622 Pain in left upper arm: Secondary | ICD-10-CM | POA: Diagnosis not present

## 2016-05-28 DIAGNOSIS — M542 Cervicalgia: Secondary | ICD-10-CM | POA: Diagnosis not present

## 2016-05-31 DIAGNOSIS — M25512 Pain in left shoulder: Secondary | ICD-10-CM | POA: Diagnosis not present

## 2016-05-31 DIAGNOSIS — M25612 Stiffness of left shoulder, not elsewhere classified: Secondary | ICD-10-CM | POA: Diagnosis not present

## 2016-05-31 DIAGNOSIS — M79622 Pain in left upper arm: Secondary | ICD-10-CM | POA: Diagnosis not present

## 2016-05-31 DIAGNOSIS — M542 Cervicalgia: Secondary | ICD-10-CM | POA: Diagnosis not present

## 2016-06-02 DIAGNOSIS — M25612 Stiffness of left shoulder, not elsewhere classified: Secondary | ICD-10-CM | POA: Diagnosis not present

## 2016-06-02 DIAGNOSIS — M79622 Pain in left upper arm: Secondary | ICD-10-CM | POA: Diagnosis not present

## 2016-06-02 DIAGNOSIS — M25512 Pain in left shoulder: Secondary | ICD-10-CM | POA: Diagnosis not present

## 2016-06-02 DIAGNOSIS — M542 Cervicalgia: Secondary | ICD-10-CM | POA: Diagnosis not present

## 2016-06-07 DIAGNOSIS — I10 Essential (primary) hypertension: Secondary | ICD-10-CM | POA: Diagnosis not present

## 2016-06-07 DIAGNOSIS — H1089 Other conjunctivitis: Secondary | ICD-10-CM | POA: Diagnosis not present

## 2016-06-07 DIAGNOSIS — E1165 Type 2 diabetes mellitus with hyperglycemia: Secondary | ICD-10-CM | POA: Diagnosis not present

## 2016-06-07 DIAGNOSIS — Z7982 Long term (current) use of aspirin: Secondary | ICD-10-CM | POA: Diagnosis not present

## 2016-06-09 DIAGNOSIS — H04123 Dry eye syndrome of bilateral lacrimal glands: Secondary | ICD-10-CM | POA: Diagnosis not present

## 2016-06-09 DIAGNOSIS — H25813 Combined forms of age-related cataract, bilateral: Secondary | ICD-10-CM | POA: Diagnosis not present

## 2016-06-09 DIAGNOSIS — E119 Type 2 diabetes mellitus without complications: Secondary | ICD-10-CM | POA: Diagnosis not present

## 2016-06-09 DIAGNOSIS — H10413 Chronic giant papillary conjunctivitis, bilateral: Secondary | ICD-10-CM | POA: Diagnosis not present

## 2016-08-18 ENCOUNTER — Encounter: Payer: Self-pay | Admitting: Gynecology

## 2016-09-03 ENCOUNTER — Other Ambulatory Visit: Payer: Self-pay | Admitting: Nurse Practitioner

## 2016-09-03 ENCOUNTER — Ambulatory Visit
Admission: RE | Admit: 2016-09-03 | Discharge: 2016-09-03 | Disposition: A | Discharge status: Home / Self Care | Payer: Commercial Managed Care - HMO | Source: Ambulatory Visit | Attending: Nurse Practitioner | Admitting: Nurse Practitioner

## 2016-09-03 DIAGNOSIS — Z2089 Contact with and (suspected) exposure to other communicable diseases: Secondary | ICD-10-CM | POA: Diagnosis not present

## 2016-09-03 DIAGNOSIS — R053 Chronic cough: Secondary | ICD-10-CM

## 2016-09-03 DIAGNOSIS — R05 Cough: Secondary | ICD-10-CM

## 2016-09-03 DIAGNOSIS — R0689 Other abnormalities of breathing: Secondary | ICD-10-CM | POA: Diagnosis not present

## 2016-09-13 DIAGNOSIS — I10 Essential (primary) hypertension: Secondary | ICD-10-CM | POA: Diagnosis not present

## 2016-09-13 DIAGNOSIS — E1165 Type 2 diabetes mellitus with hyperglycemia: Secondary | ICD-10-CM | POA: Diagnosis not present

## 2016-09-13 DIAGNOSIS — Z1389 Encounter for screening for other disorder: Secondary | ICD-10-CM | POA: Diagnosis not present

## 2016-09-13 DIAGNOSIS — Z0000 Encounter for general adult medical examination without abnormal findings: Secondary | ICD-10-CM | POA: Diagnosis not present

## 2016-09-13 DIAGNOSIS — E2839 Other primary ovarian failure: Secondary | ICD-10-CM | POA: Diagnosis not present

## 2016-09-13 DIAGNOSIS — E559 Vitamin D deficiency, unspecified: Secondary | ICD-10-CM | POA: Diagnosis not present

## 2016-09-21 ENCOUNTER — Other Ambulatory Visit: Payer: Self-pay | Admitting: Internal Medicine

## 2016-09-21 DIAGNOSIS — E2839 Other primary ovarian failure: Secondary | ICD-10-CM

## 2016-09-21 DIAGNOSIS — Z1231 Encounter for screening mammogram for malignant neoplasm of breast: Secondary | ICD-10-CM

## 2016-10-27 ENCOUNTER — Ambulatory Visit
Admission: RE | Admit: 2016-10-27 | Discharge: 2016-10-27 | Disposition: A | Discharge status: Home / Self Care | Payer: Commercial Managed Care - HMO | Source: Ambulatory Visit | Attending: Internal Medicine | Admitting: Internal Medicine

## 2016-10-27 DIAGNOSIS — Z780 Asymptomatic menopausal state: Secondary | ICD-10-CM | POA: Diagnosis not present

## 2016-10-27 DIAGNOSIS — Z1231 Encounter for screening mammogram for malignant neoplasm of breast: Secondary | ICD-10-CM

## 2016-10-27 DIAGNOSIS — M85852 Other specified disorders of bone density and structure, left thigh: Secondary | ICD-10-CM | POA: Diagnosis not present

## 2016-10-27 DIAGNOSIS — E2839 Other primary ovarian failure: Secondary | ICD-10-CM

## 2017-01-17 DIAGNOSIS — E1165 Type 2 diabetes mellitus with hyperglycemia: Secondary | ICD-10-CM | POA: Diagnosis not present

## 2017-01-17 DIAGNOSIS — Z6830 Body mass index (BMI) 30.0-30.9, adult: Secondary | ICD-10-CM | POA: Diagnosis not present

## 2017-01-17 DIAGNOSIS — R233 Spontaneous ecchymoses: Secondary | ICD-10-CM | POA: Diagnosis not present

## 2017-01-17 DIAGNOSIS — I10 Essential (primary) hypertension: Secondary | ICD-10-CM | POA: Diagnosis not present

## 2017-04-18 DIAGNOSIS — R350 Frequency of micturition: Secondary | ICD-10-CM | POA: Diagnosis not present

## 2017-04-18 DIAGNOSIS — E1165 Type 2 diabetes mellitus with hyperglycemia: Secondary | ICD-10-CM | POA: Diagnosis not present

## 2017-04-18 DIAGNOSIS — I10 Essential (primary) hypertension: Secondary | ICD-10-CM | POA: Diagnosis not present

## 2017-04-18 DIAGNOSIS — J209 Acute bronchitis, unspecified: Secondary | ICD-10-CM | POA: Diagnosis not present

## 2017-06-15 DIAGNOSIS — H25813 Combined forms of age-related cataract, bilateral: Secondary | ICD-10-CM | POA: Diagnosis not present

## 2017-06-15 DIAGNOSIS — H40013 Open angle with borderline findings, low risk, bilateral: Secondary | ICD-10-CM | POA: Diagnosis not present

## 2017-06-15 DIAGNOSIS — H04123 Dry eye syndrome of bilateral lacrimal glands: Secondary | ICD-10-CM | POA: Diagnosis not present

## 2017-06-15 DIAGNOSIS — H10413 Chronic giant papillary conjunctivitis, bilateral: Secondary | ICD-10-CM | POA: Diagnosis not present

## 2017-06-15 DIAGNOSIS — E119 Type 2 diabetes mellitus without complications: Secondary | ICD-10-CM | POA: Diagnosis not present

## 2017-07-13 DIAGNOSIS — D1801 Hemangioma of skin and subcutaneous tissue: Secondary | ICD-10-CM | POA: Diagnosis not present

## 2017-07-13 DIAGNOSIS — D225 Melanocytic nevi of trunk: Secondary | ICD-10-CM | POA: Diagnosis not present

## 2017-07-13 DIAGNOSIS — Z85828 Personal history of other malignant neoplasm of skin: Secondary | ICD-10-CM | POA: Diagnosis not present

## 2017-07-13 DIAGNOSIS — L570 Actinic keratosis: Secondary | ICD-10-CM | POA: Diagnosis not present

## 2017-07-13 DIAGNOSIS — L814 Other melanin hyperpigmentation: Secondary | ICD-10-CM | POA: Diagnosis not present

## 2017-08-23 DIAGNOSIS — W11XXXA Fall on and from ladder, initial encounter: Secondary | ICD-10-CM | POA: Diagnosis not present

## 2017-08-23 DIAGNOSIS — E669 Obesity, unspecified: Secondary | ICD-10-CM | POA: Diagnosis not present

## 2017-08-23 DIAGNOSIS — E1165 Type 2 diabetes mellitus with hyperglycemia: Secondary | ICD-10-CM | POA: Diagnosis not present

## 2017-08-23 DIAGNOSIS — I10 Essential (primary) hypertension: Secondary | ICD-10-CM | POA: Diagnosis not present

## 2017-11-09 DIAGNOSIS — E7800 Pure hypercholesterolemia, unspecified: Secondary | ICD-10-CM | POA: Diagnosis not present

## 2017-11-09 DIAGNOSIS — E1165 Type 2 diabetes mellitus with hyperglycemia: Secondary | ICD-10-CM | POA: Diagnosis not present

## 2017-11-09 DIAGNOSIS — R011 Cardiac murmur, unspecified: Secondary | ICD-10-CM | POA: Diagnosis not present

## 2017-11-09 DIAGNOSIS — I10 Essential (primary) hypertension: Secondary | ICD-10-CM | POA: Diagnosis not present

## 2017-11-09 DIAGNOSIS — Z79899 Other long term (current) drug therapy: Secondary | ICD-10-CM | POA: Diagnosis not present

## 2017-11-16 ENCOUNTER — Other Ambulatory Visit: Payer: Self-pay | Admitting: Internal Medicine

## 2017-11-16 DIAGNOSIS — Z1231 Encounter for screening mammogram for malignant neoplasm of breast: Secondary | ICD-10-CM

## 2017-12-12 ENCOUNTER — Ambulatory Visit: Payer: Self-pay

## 2017-12-27 ENCOUNTER — Ambulatory Visit
Admission: RE | Admit: 2017-12-27 | Discharge: 2017-12-27 | Disposition: A | Discharge status: Home / Self Care | Payer: Medicare HMO | Source: Ambulatory Visit | Attending: Internal Medicine | Admitting: Internal Medicine

## 2017-12-27 DIAGNOSIS — Z1231 Encounter for screening mammogram for malignant neoplasm of breast: Secondary | ICD-10-CM

## 2018-01-20 ENCOUNTER — Ambulatory Visit: Payer: Self-pay | Admitting: Cardiology

## 2018-02-21 ENCOUNTER — Other Ambulatory Visit: Payer: Self-pay | Admitting: Internal Medicine

## 2018-03-06 ENCOUNTER — Other Ambulatory Visit: Payer: Self-pay | Admitting: Internal Medicine

## 2018-03-10 ENCOUNTER — Telehealth: Payer: Self-pay

## 2018-03-10 NOTE — Telephone Encounter (Signed)
Pt notified that her Effexor and Celebrex from the pt assistance has been delivered to the office and is ready for pickup.

## 2018-03-20 ENCOUNTER — Telehealth: Payer: Self-pay

## 2018-03-20 NOTE — Telephone Encounter (Signed)
Left the pt a message that her paperwork for the Effexor and celebrex  pt assistance is rady for pickup.

## 2018-04-19 ENCOUNTER — Other Ambulatory Visit: Payer: Self-pay

## 2018-04-19 MED ORDER — CELECOXIB 200 MG PO CAPS: 200.0000 mg | ORAL_CAPSULE | Freq: Two times a day (BID) | ORAL | 1 refills | Status: DC

## 2018-04-19 MED ORDER — VENLAFAXINE HCL 37.5 MG PO TABS: 37.5000 mg | ORAL_TABLET | Freq: Every day | ORAL | 1 refills | Status: DC

## 2018-04-20 ENCOUNTER — Telehealth: Payer: Self-pay

## 2018-04-20 NOTE — Telephone Encounter (Signed)
Left the pt a message that her prescriptions has been faxed to the [place] pt assistance program.

## 2018-05-02 ENCOUNTER — Other Ambulatory Visit: Payer: Self-pay | Admitting: Internal Medicine

## 2018-05-15 ENCOUNTER — Ambulatory Visit (INDEPENDENT_AMBULATORY_CARE_PROVIDER_SITE_OTHER): Payer: Medicare HMO | Admitting: Internal Medicine

## 2018-05-15 ENCOUNTER — Encounter: Payer: Self-pay | Admitting: Internal Medicine

## 2018-05-15 VITALS — BP 136/74 | HR 74 | Temp 97.7°F | Ht 60.5 in | Wt 153.0 lb

## 2018-05-15 DIAGNOSIS — E1165 Type 2 diabetes mellitus with hyperglycemia: Secondary | ICD-10-CM

## 2018-05-15 DIAGNOSIS — I10 Essential (primary) hypertension: Secondary | ICD-10-CM | POA: Diagnosis not present

## 2018-05-15 DIAGNOSIS — E663 Overweight: Secondary | ICD-10-CM | POA: Diagnosis not present

## 2018-05-15 DIAGNOSIS — F3341 Major depressive disorder, recurrent, in partial remission: Secondary | ICD-10-CM | POA: Diagnosis not present

## 2018-05-15 DIAGNOSIS — Z6829 Body mass index (BMI) 29.0-29.9, adult: Secondary | ICD-10-CM

## 2018-05-15 DIAGNOSIS — E1169 Type 2 diabetes mellitus with other specified complication: Secondary | ICD-10-CM | POA: Insufficient documentation

## 2018-05-15 NOTE — Patient Instructions (Signed)
Diabetes Mellitus and Nutrition, Adult  When you have diabetes (diabetes mellitus), it is very important to have healthy eating habits because your blood sugar (glucose) levels are greatly affected by what you eat and drink. Eating healthy foods in the appropriate amounts, at about the same times every day, can help you:  · Control your blood glucose.  · Lower your risk of heart disease.  · Improve your blood pressure.  · Reach or maintain a healthy weight.  Every person with diabetes is different, and each person has different needs for a meal plan. Your health care provider may recommend that you work with a diet and nutrition specialist (dietitian) to make a meal plan that is best for you. Your meal plan may vary depending on factors such as:  · The calories you need.  · The medicines you take.  · Your weight.  · Your blood glucose, blood pressure, and cholesterol levels.  · Your activity level.  · Other health conditions you have, such as heart or kidney disease.  How do carbohydrates affect me?  Carbohydrates, also called carbs, affect your blood glucose level more than any other type of food. Eating carbs naturally raises the amount of glucose in your blood. Carb counting is a method for keeping track of how many carbs you eat. Counting carbs is important to keep your blood glucose at a healthy level, especially if you use insulin or take certain oral diabetes medicines.  It is important to know how many carbs you can safely have in each meal. This is different for every person. Your dietitian can help you calculate how many carbs you should have at each meal and for each snack.  Foods that contain carbs include:  · Bread, cereal, rice, pasta, and crackers.  · Potatoes and corn.  · Peas, beans, and lentils.  · Milk and yogurt.  · Fruit and juice.  · Desserts, such as cakes, cookies, ice cream, and candy.  How does alcohol affect me?  Alcohol can cause a sudden decrease in blood glucose (hypoglycemia),  especially if you use insulin or take certain oral diabetes medicines. Hypoglycemia can be a life-threatening condition. Symptoms of hypoglycemia (sleepiness, dizziness, and confusion) are similar to symptoms of having too much alcohol.  If your health care provider says that alcohol is safe for you, follow these guidelines:  · Limit alcohol intake to no more than 1 drink per day for nonpregnant women and 2 drinks per day for men. One drink equals 12 oz of beer, 5 oz of wine, or 1½ oz of hard liquor.  · Do not drink on an empty stomach.  · Keep yourself hydrated with water, diet soda, or unsweetened iced tea.  · Keep in mind that regular soda, juice, and other mixers may contain a lot of sugar and must be counted as carbs.  What are tips for following this plan?    Reading food labels  · Start by checking the serving size on the "Nutrition Facts" label of packaged foods and drinks. The amount of calories, carbs, fats, and other nutrients listed on the label is based on one serving of the item. Many items contain more than one serving per package.  · Check the total grams (g) of carbs in one serving. You can calculate the number of servings of carbs in one serving by dividing the total carbs by 15. For example, if a food has 30 g of total carbs, it would be equal to 2   servings of carbs.  · Check the number of grams (g) of saturated and trans fats in one serving. Choose foods that have low or no amount of these fats.  · Check the number of milligrams (mg) of salt (sodium) in one serving. Most people should limit total sodium intake to less than 2,300 mg per day.  · Always check the nutrition information of foods labeled as "low-fat" or "nonfat". These foods may be higher in added sugar or refined carbs and should be avoided.  · Talk to your dietitian to identify your daily goals for nutrients listed on the label.  Shopping  · Avoid buying canned, premade, or processed foods. These foods tend to be high in fat, sodium,  and added sugar.  · Shop around the outside edge of the grocery store. This includes fresh fruits and vegetables, bulk grains, fresh meats, and fresh dairy.  Cooking  · Use low-heat cooking methods, such as baking, instead of high-heat cooking methods like deep frying.  · Cook using healthy oils, such as olive, canola, or sunflower oil.  · Avoid cooking with butter, cream, or high-fat meats.  Meal planning  · Eat meals and snacks regularly, preferably at the same times every day. Avoid going long periods of time without eating.  · Eat foods high in fiber, such as fresh fruits, vegetables, beans, and whole grains. Talk to your dietitian about how many servings of carbs you can eat at each meal.  · Eat 4-6 ounces (oz) of lean protein each day, such as lean meat, chicken, fish, eggs, or tofu. One oz of lean protein is equal to:  ? 1 oz of meat, chicken, or fish.  ? 1 egg.  ? ¼ cup of tofu.  · Eat some foods each day that contain healthy fats, such as avocado, nuts, seeds, and fish.  Lifestyle  · Check your blood glucose regularly.  · Exercise regularly as told by your health care provider. This may include:  ? 150 minutes of moderate-intensity or vigorous-intensity exercise each week. This could be brisk walking, biking, or water aerobics.  ? Stretching and doing strength exercises, such as yoga or weightlifting, at least 2 times a week.  · Take medicines as told by your health care provider.  · Do not use any products that contain nicotine or tobacco, such as cigarettes and e-cigarettes. If you need help quitting, ask your health care provider.  · Work with a counselor or diabetes educator to identify strategies to manage stress and any emotional and social challenges.  Questions to ask a health care provider  · Do I need to meet with a diabetes educator?  · Do I need to meet with a dietitian?  · What number can I call if I have questions?  · When are the best times to check my blood glucose?  Where to find more  information:  · American Diabetes Association: diabetes.org  · Academy of Nutrition and Dietetics: www.eatright.org  · National Institute of Diabetes and Digestive and Kidney Diseases (NIH): www.niddk.nih.gov  Summary  · A healthy meal plan will help you control your blood glucose and maintain a healthy lifestyle.  · Working with a diet and nutrition specialist (dietitian) can help you make a meal plan that is best for you.  · Keep in mind that carbohydrates (carbs) and alcohol have immediate effects on your blood glucose levels. It is important to count carbs and to use alcohol carefully.  This information is not intended to   replace advice given to you by your health care provider. Make sure you discuss any questions you have with your health care provider.  Document Released: 12/17/2004 Document Revised: 10/20/2016 Document Reviewed: 04/26/2016  Elsevier Interactive Patient Education © 2019 Elsevier Inc.

## 2018-05-15 NOTE — Progress Notes (Signed)
Subjective:     Patient ID: Virginia Russell , female    DOB: October 25, 1945 , 73 y.o.   MRN: [id number]   Chief Complaint  Patient presents with  . Diabetes  . Hypertension    HPI  Diabetes  She presents for her follow-up diabetic visit. She has type 2 diabetes mellitus. Her disease course has been stable. There are no hypoglycemic associated symptoms. Pertinent negatives for diabetes include no blurred vision and no chest pain. There are no hypoglycemic complications. Risk factors for coronary artery disease include dyslipidemia, diabetes mellitus, hypertension, post-menopausal and stress. She is compliant with treatment most of the time. She is following a diabetic diet. Her breakfast blood glucose is taken between 7-8 am. Her breakfast blood glucose range is generally 90-110 mg/dl. An ACE inhibitor/angiotensin II receptor blocker is being taken. Eye exam is current.  Hypertension  This is a chronic problem. The current episode started more than 1 year ago. The problem has been gradually improving since onset. The problem is controlled. Pertinent negatives include no blurred vision, chest pain, palpitations or shortness of breath.   She reports compliance with meds.   Past Medical History:  Diagnosis Date  . Anxiety   . Diabetes mellitus without complication ([place])   . High cholesterol      Family History  Problem Relation Age of Onset  . Breast cancer Mother   . Heart disease Mother   . Stroke Father   . Heart attack Father   . Heart attack Daughter      Current Outpatient Medications:  .  Alogliptin Benzoate (NESINA) 25 MG TABS, Take 1 tablet by mouth daily., Disp: , Rfl:  .  aspirin 81 MG tablet, Take 81 mg by mouth daily., Disp: , Rfl:  .  atorvastatin (LIPITOR) 40 MG tablet, TAKE 1 TABLET AT NIGHT, Disp: 90 tablet, Rfl: 1 .  celecoxib (CELEBREX) 200 MG capsule, Take 1 capsule (200 mg total) by mouth 2 (two) times daily., Disp: 180 capsule, Rfl: 1 .  losartan (COZAAR) 50  MG tablet, TAKE 1 TABLET EVERY DAY, Disp: 90 tablet, Rfl: 0 .  metFORMIN (GLUCOPHAGE-XR) 750 MG 24 hr tablet, Take 750 mg by mouth 3 (three) times daily., Disp: , Rfl:  .  pioglitazone (ACTOS) 30 MG tablet, TAKE 1 TABLET EVERY DAY, Disp: 90 tablet, Rfl: 1 .  venlafaxine (EFFEXOR) 37.5 MG tablet, Take 1 tablet (37.5 mg total) by mouth daily., Disp: 90 tablet, Rfl: 1   Allergies  Allergen Reactions  . Codeine      Review of Systems  Constitutional: Negative.   Eyes: Negative for blurred vision.  Respiratory: Negative.  Negative for shortness of breath.   Cardiovascular: Negative.  Negative for chest pain and palpitations.  Gastrointestinal: Negative.   Neurological: Negative.   Psychiatric/Behavioral: Negative.      Today's Vitals   05/15/18 0840  BP: 136/74  Pulse: 74  Temp: 97.7 F (36.5 C)  TempSrc: Oral  Weight: 153 lb (69.4 kg)  Height: 5' 0.5" (1.537 m)  PainSc: 0-No pain   Body mass index is 29.39 kg/m.   Objective:  Physical Exam Vitals signs and nursing note reviewed.  Constitutional:      Appearance: Normal appearance.  HENT:     Head: Normocephalic and atraumatic.  Cardiovascular:     Rate and Rhythm: Normal rate and regular rhythm.     Heart sounds: Normal heart sounds.  Pulmonary:     Effort: Pulmonary effort is normal.  Breath sounds: Normal breath sounds.  Skin:    General: Skin is warm.  Neurological:     General: No focal deficit present.     Mental Status: She is alert.  Psychiatric:        Mood and Affect: Mood normal.         Assessment And Plan:     1. Uncontrolled type 2 diabetes mellitus with hyperglycemia ([place])  I will check labs as listed below. Importance of medication and dietary compliance was discussed with the patient.  I also signed paperwork for patient assistance for Bernita Buffy so she can receive her Nesina.   - Hepatitis C antibody - CMP14+EGFR - Hemoglobin A1c - Lipid panel  2. Essential hypertension, benign  Fair  control. She is advised that optimal blood pressure is less than 130/80. She will continue with current meds.   3. Recurrent major depressive disorder, in partial remission (HCC)  Chronic. She feels well on meds. She has been on the medication for greater than 3 years. She does not wish to come off of the medication.   4. Overweight (BMI 25.0-29.9)  She is encouraged to incorporate more exercise (outside of her regular job) into her daily routine.  Maximino Greenland, MD

## 2018-05-16 LAB — HEMOGLOBIN A1C
Est. average glucose Bld gHb Est-mCnc: 169 mg/dL
Hgb A1c MFr Bld: 7.5 % — ABNORMAL HIGH (ref 4.8–5.6)

## 2018-05-16 LAB — CMP14+EGFR
ALT: 12 IU/L (ref 0–32)
AST: 16 IU/L (ref 0–40)
Albumin/Globulin Ratio: 2 (ref 1.2–2.2)
Albumin: 4.5 g/dL (ref 3.7–4.7)
Alkaline Phosphatase: 68 IU/L (ref 39–117)
BUN/Creatinine Ratio: 34 — ABNORMAL HIGH (ref 12–28)
BUN: 21 mg/dL (ref 8–27)
Bilirubin Total: 0.7 mg/dL (ref 0.0–1.2)
CO2: 24 mmol/L (ref 20–29)
Calcium: 9.6 mg/dL (ref 8.7–10.3)
Chloride: 102 mmol/L (ref 96–106)
Creatinine, Ser: 0.62 mg/dL (ref 0.57–1.00)
GFR calc Af Amer: 103 mL/min/{1.73_m2} (ref >59)
GFR calc non Af Amer: 90 mL/min/{1.73_m2} (ref >59)
Globulin, Total: 2.2 g/dL (ref 1.5–4.5)
Glucose: 144 mg/dL — ABNORMAL HIGH (ref 65–99)
Potassium: 4.6 mmol/L (ref 3.5–5.2)
Sodium: 141 mmol/L (ref 134–144)
Total Protein: 6.7 g/dL (ref 6.0–8.5)

## 2018-05-16 LAB — LIPID PANEL
Chol/HDL Ratio: 2.2 ratio (ref 0.0–4.4)
Cholesterol, Total: 177 mg/dL (ref 100–199)
HDL: 81 mg/dL (ref >39)
LDL Calculated: 85 mg/dL (ref 0–99)
Triglycerides: 56 mg/dL (ref 0–149)
VLDL Cholesterol Cal: 11 mg/dL (ref 5–40)

## 2018-05-16 LAB — HEPATITIS C ANTIBODY: Hep C Virus Ab: <0.1 s/co ratio (ref 0.0–0.9)

## 2018-05-17 ENCOUNTER — Telehealth: Payer: Self-pay

## 2018-05-17 NOTE — Telephone Encounter (Signed)
Pt's daughter Helene Kelp notified that her mother's celebrex and effexor medications has arrived from [employer] patient assistance program and is ready for pick-up.

## 2018-05-23 ENCOUNTER — Telehealth: Payer: Self-pay

## 2018-05-23 NOTE — Telephone Encounter (Signed)
The pt was given her lab results and she said to let Dr. Baird Cancer know that she will need a different diabetes med because she is waiting on approval for Nesina 25 mg daily because she has to fill out a different application that [facility] sent to the office to have the patient fill out.

## 2018-05-25 ENCOUNTER — Telehealth: Payer: Self-pay

## 2018-05-25 NOTE — Telephone Encounter (Signed)
-----   Message from Glendale Chard, MD sent at 05/23/2018  5:47 PM EST ----- We can give her Januvia 100mg  once daily for now. This will take place of Nesina. Let us see if she tolerates this.

## 2018-05-25 NOTE — Telephone Encounter (Signed)
Patient notified

## 2018-07-07 ENCOUNTER — Other Ambulatory Visit: Payer: Self-pay

## 2018-07-07 MED ORDER — ALOGLIPTIN BENZOATE 25 MG PO TABS: 1.0000 | ORAL_TABLET | Freq: Every day | ORAL | 1 refills | Status: DC

## 2018-07-17 ENCOUNTER — Other Ambulatory Visit: Payer: Self-pay

## 2018-07-17 MED ORDER — LOSARTAN POTASSIUM 50 MG PO TABS: 50.0000 mg | ORAL_TABLET | Freq: Every day | ORAL | 1 refills | Status: DC

## 2018-08-07 ENCOUNTER — Other Ambulatory Visit: Payer: Self-pay

## 2018-08-07 ENCOUNTER — Telehealth: Payer: Self-pay

## 2018-08-07 MED ORDER — CELECOXIB 200 MG PO CAPS: 200.0000 mg | ORAL_CAPSULE | Freq: Two times a day (BID) | ORAL | 1 refills | Status: DC

## 2018-08-07 MED ORDER — VENLAFAXINE HCL 37.5 MG PO TABS: 37.5000 mg | ORAL_TABLET | Freq: Every day | ORAL | 1 refills | Status: DC

## 2018-08-07 MED ORDER — ATORVASTATIN CALCIUM 40 MG PO TABS: ORAL_TABLET | ORAL | 1 refills | Status: DC

## 2018-08-07 NOTE — Telephone Encounter (Signed)
Patient called requesting a refill of atorvastatin and a refill on Effexor and Celebrex to be sent to [place]  RETURNED PT CALL NOTIFIED [place] SENT. Lonia Mad

## 2018-08-07 NOTE — Telephone Encounter (Signed)
Called [facility] to refill patient's Celebrex and Effexor. Celebrex was refilled and pt should receive it within 7 days the order number is [id number] next refill can be filled on 07/13. The lady had to call IT to get them to fix the patient's prescription on Effexor because it said no refills and she actually has 1 more refill left I was advised to call tomorrow to get the order number. [phone] [facility]

## 2018-08-15 ENCOUNTER — Telehealth: Payer: Self-pay

## 2018-08-15 NOTE — Telephone Encounter (Signed)
The pt was notified that her Effexor and Celebrex has arrived form the [place] pt assistance program.

## 2018-08-24 ENCOUNTER — Other Ambulatory Visit: Payer: Self-pay | Admitting: Internal Medicine

## 2018-09-12 ENCOUNTER — Other Ambulatory Visit: Payer: Self-pay | Admitting: Internal Medicine

## 2018-09-19 ENCOUNTER — Ambulatory Visit (INDEPENDENT_AMBULATORY_CARE_PROVIDER_SITE_OTHER): Payer: Medicare HMO | Admitting: Internal Medicine

## 2018-09-19 ENCOUNTER — Other Ambulatory Visit: Payer: Self-pay

## 2018-09-19 ENCOUNTER — Encounter: Payer: Self-pay | Admitting: Internal Medicine

## 2018-09-19 VITALS — BP 146/72 | HR 55 | Temp 98.4°F | Ht 60.5 in | Wt 155.4 lb

## 2018-09-19 DIAGNOSIS — E1165 Type 2 diabetes mellitus with hyperglycemia: Secondary | ICD-10-CM | POA: Diagnosis not present

## 2018-09-19 DIAGNOSIS — E663 Overweight: Secondary | ICD-10-CM

## 2018-09-19 DIAGNOSIS — Z780 Asymptomatic menopausal state: Secondary | ICD-10-CM

## 2018-09-19 DIAGNOSIS — I10 Essential (primary) hypertension: Secondary | ICD-10-CM

## 2018-09-19 DIAGNOSIS — M810 Age-related osteoporosis without current pathological fracture: Secondary | ICD-10-CM | POA: Diagnosis not present

## 2018-09-19 DIAGNOSIS — M8580 Other specified disorders of bone density and structure, unspecified site: Secondary | ICD-10-CM | POA: Insufficient documentation

## 2018-09-19 NOTE — Progress Notes (Signed)
Subjective:     Patient ID: Virginia Russell , female    DOB: 06/05/1945 , 73 y.o.   MRN: [id number]   Chief Complaint  Patient presents with  . Diabetes  . Hypertension    HPI  Diabetes She presents for her follow-up diabetic visit. She has type 2 diabetes mellitus. Her disease course has been stable. There are no hypoglycemic associated symptoms. Pertinent negatives for diabetes include no blurred vision and no chest pain. There are no hypoglycemic complications. Risk factors for coronary artery disease include dyslipidemia, diabetes mellitus, hypertension, post-menopausal and stress. She is compliant with treatment most of the time. She is following a diabetic diet. She participates in exercise intermittently. Her breakfast blood glucose is taken between 7-8 am. Her breakfast blood glucose range is generally 90-110 mg/dl. An ACE inhibitor/angiotensin II receptor blocker is being taken. Eye exam is current.  Hypertension This is a chronic problem. The current episode started more than 1 year ago. The problem has been gradually improving since onset. The problem is controlled. Pertinent negatives include no blurred vision, chest pain, palpitations or shortness of breath. Past treatments include ACE inhibitors and diuretics. The current treatment provides moderate improvement.     Past Medical History:  Diagnosis Date  . Anxiety   . Diabetes mellitus without complication ([place])   . High cholesterol   . HTN (hypertension)      Family History  Problem Relation Age of Onset  . Breast cancer Mother   . Heart disease Mother   . Stroke Father   . Heart attack Father   . Heart attack Daughter      Current Outpatient Medications:  .  Alogliptin Benzoate (NESINA) 25 MG TABS, Take 1 tablet by mouth daily., Disp: 90 tablet, Rfl: 1 .  aspirin 81 MG tablet, Take 81 mg by mouth daily., Disp: , Rfl:  .  atorvastatin (LIPITOR) 40 MG tablet, TAKE 1 TABLET AT NIGHT, Disp: 90 tablet, Rfl:  1 .  celecoxib (CELEBREX) 200 MG capsule, Take 1 capsule (200 mg total) by mouth 2 (two) times daily., Disp: 180 capsule, Rfl: 1 .  losartan (COZAAR) 50 MG tablet, TAKE 1 TABLET EVERY DAY, Disp: 90 tablet, Rfl: 1 .  metFORMIN (GLUCOPHAGE-XR) 750 MG 24 hr tablet, TAKE 2 TABLETS EVERY EVENING, Disp: 180 tablet, Rfl: 1 .  pioglitazone (ACTOS) 30 MG tablet, TAKE 1 TABLET EVERY DAY, Disp: 90 tablet, Rfl: 1 .  venlafaxine (EFFEXOR) 37.5 MG tablet, Take 1 tablet (37.5 mg total) by mouth daily., Disp: 90 tablet, Rfl: 1   Allergies  Allergen Reactions  . Codeine      Review of Systems  Constitutional: Negative.   Eyes: Negative for blurred vision.  Respiratory: Negative.  Negative for shortness of breath.   Cardiovascular: Negative.  Negative for chest pain and palpitations.  Gastrointestinal: Negative.   Neurological: Negative.   Psychiatric/Behavioral: Negative.      Today's Vitals   09/19/18 0842  BP: (!) 146/72  Pulse: (!) 55  Temp: 98.4 F (36.9 C)  TempSrc: Oral  Weight: 155 lb 6.4 oz (70.5 kg)  Height: 5' 0.5" (1.537 m)  PainSc: 0-No pain   Body mass index is 29.85 kg/m.   Objective:  Physical Exam Vitals signs and nursing note reviewed.  Constitutional:      Appearance: Normal appearance.  HENT:     Head: Normocephalic and atraumatic.  Cardiovascular:     Rate and Rhythm: Normal rate and regular rhythm.     Pulses:  Dorsalis pedis pulses are 2+ on the right side and 2+ on the left side.     Heart sounds: Normal heart sounds.  Pulmonary:     Effort: Pulmonary effort is normal.     Breath sounds: Normal breath sounds.  Feet:     Right foot:     Protective Sensation: 5 sites tested. 5 sites sensed.     Skin integrity: Skin integrity normal.     Toenail Condition: Right toenails are normal.     Left foot:     Protective Sensation: 5 sites tested. 5 sites sensed.     Skin integrity: Skin integrity normal.     Toenail Condition: Left toenails are normal.   Skin:    General: Skin is warm.  Neurological:     General: No focal deficit present.     Mental Status: She is alert.  Psychiatric:        Mood and Affect: Mood normal.        Behavior: Behavior normal.         Assessment And Plan:     1. Uncontrolled type 2 diabetes mellitus with hyperglycemia (HCC)  Diabetic foot exam was performed today.  I will check labs as listed below. She will rto in 3-4 months for her next physical exam. She has no specific concerns or complaints at this time.   - BMP8+EGFR - Hemoglobin A1c  2. Essential hypertension, benign  Fair control. She will continue with current meds for now. She is encouraged to avoid adding salt to her foods. Pt advised that I will need to add another medication to her current regimen if her bp is elevated at her next visit.   3. Osteopenia after menopause  She is due for dexa scan in Oct 2020. Importance of calcium and vitamin D supplementation, along with weight bearing exercises was discussed with the patient.   - DG Bone Density; Future  4. Overweight (BMI 25.0-29.9)  She is encouraged to incorporate more exercise into her daily routine. She is advised to aim for 150 minutes per week.   Maximino Greenland, MD    THE PATIENT IS ENCOURAGED TO PRACTICE SOCIAL DISTANCING DUE TO THE COVID-19 PANDEMIC.

## 2018-09-20 LAB — HEMOGLOBIN A1C
Est. average glucose Bld gHb Est-mCnc: 163 mg/dL
Hgb A1c MFr Bld: 7.3 % — ABNORMAL HIGH (ref 4.8–5.6)

## 2018-09-20 LAB — BMP8+EGFR
BUN/Creatinine Ratio: 22 (ref 12–28)
BUN: 14 mg/dL (ref 8–27)
CO2: 25 mmol/L (ref 20–29)
Calcium: 9.6 mg/dL (ref 8.7–10.3)
Chloride: 101 mmol/L (ref 96–106)
Creatinine, Ser: 0.65 mg/dL (ref 0.57–1.00)
GFR calc Af Amer: 102 mL/min/{1.73_m2} (ref >59)
GFR calc non Af Amer: 88 mL/min/{1.73_m2} (ref >59)
Glucose: 143 mg/dL — ABNORMAL HIGH (ref 65–99)
Potassium: 4.6 mmol/L (ref 3.5–5.2)
Sodium: 139 mmol/L (ref 134–144)

## 2018-09-28 ENCOUNTER — Telehealth: Payer: Self-pay

## 2018-09-28 NOTE — Telephone Encounter (Signed)
Returned the pt's call and left a detailed message Notes recorded by Glendale Chard, MD on 09/20/2018 at 7:39 AM EDT  Here are your lab results:   Your kidney fxn is nl. a1c is 7.3, this is great! Keep up the great work. Stay safe!   Sincerely,    Robyn N. Baird Cancer, MD

## 2018-10-17 ENCOUNTER — Other Ambulatory Visit: Payer: Self-pay

## 2018-10-17 MED ORDER — BD SWABS SINGLE USE BUTTERFLY PADS: 100.0000 | MEDICATED_PAD | 3 refills | Status: DC | PRN

## 2018-10-19 ENCOUNTER — Other Ambulatory Visit: Payer: Self-pay

## 2018-10-19 MED ORDER — ACCU-CHEK AVIVA PLUS W/DEVICE KIT: PACK | 1 refills | Status: DC

## 2018-10-19 MED ORDER — ACCU-CHEK AVIVA PLUS VI STRP: ORAL_STRIP | 3 refills | Status: DC

## 2018-10-19 MED ORDER — ACCU-CHEK SOFTCLIX LANCETS MISC: 3 refills | Status: DC

## 2018-10-19 MED ORDER — BD SWABS SINGLE USE BUTTERFLY PADS: 100.0000 | MEDICATED_PAD | 3 refills | Status: AC | PRN

## 2018-10-26 ENCOUNTER — Telehealth: Payer: Self-pay

## 2018-10-26 NOTE — Telephone Encounter (Signed)
Returned the pt's call and notified her of her most recent lab results.

## 2018-11-10 ENCOUNTER — Telehealth: Payer: Self-pay

## 2018-11-10 NOTE — Telephone Encounter (Signed)
I left the pt a message to call the office back.  I called the pt to let her know that Dr.Sanders got a fax from [employer] that they lost their patent exclusivity to the Celebrex and that now the generic alternatives are reasonably priced at $30 or less for a 30 day supply and will no longer be offered through [employer] patient assistance program.

## 2018-11-22 ENCOUNTER — Telehealth: Payer: Self-pay | Admitting: Internal Medicine

## 2018-11-22 NOTE — Chronic Care Management (AMB) (Signed)
°  Chronic Care Management   Outreach Note  11/22/2018 Name: Natika Geyer [facility] MRN: [id number] DOB: January 31, 1946  Referred by: Glendale Chard, MD Reason for referral : Chronic Care Management (Initial CCM outreach was unsuccessful.)   An unsuccessful telephone outreach was attempted today. The patient was referred to the case management team by for assistance with chronic care management and care coordination.   Follow Up Plan: A HIPPA compliant phone message was left for the patient providing contact information and requesting a return call.  The care management team will reach out to the patient again over the next 7 days.  If patient returns call to provider office, please advise to call [place] at [place]  [email or web address]   ??[phone]

## 2018-11-30 NOTE — Chronic Care Management (AMB) (Signed)
°  Chronic Care Management   Outreach Note  11/30/2018 Name: Virginia Russell [facility] MRN: [id number] DOB: [birth year]  Referred by: Glendale Chard, MD Reason for referral : Chronic Care Management (Initial CCM outreach was unsuccessful.) and Chronic Care Management (Second CCM outreach was unsuccessful. )   A second unsuccessful telephone outreach was attempted today. The patient was referred to the case management team for assistance with chronic care management and care coordination.   Follow Up Plan: A HIPPA compliant phone message was left for the patient providing contact information and requesting a return call.  The care management team will reach out to the patient again over the next 7 days.  If patient returns call to provider office, please advise to call [place] at [place]  [email or web address]   ??[phone]

## 2018-12-04 ENCOUNTER — Telehealth: Payer: Self-pay

## 2018-12-04 ENCOUNTER — Other Ambulatory Visit: Payer: Self-pay

## 2018-12-04 NOTE — Chronic Care Management (AMB) (Signed)
°  Chronic Care Management   Outreach Note  12/04/2018 Name: Virginia Russell [facility] MRN: [id number] DOB: Aug 18, 1945  Referred by: Glendale Chard, MD Reason for referral : Chronic Care Management (Initial CCM outreach was unsuccessful.), Chronic Care Management (Second CCM outreach was unsuccessful. ), and Chronic Care Management (Third CCM Marca Ancona was unsuccessful.)   Third unsuccessful telephone outreach was attempted today. The patient was referred to the case management team for assistance with chronic care management and care coordination. The patient's primary care provider has been notified of our unsuccessful attempts to make or maintain contact with the patient. The care management team is pleased to engage with this patient at any time in the future should he/she be interested in assistance from the care management team.   Follow Up Plan: The care management team is available to follow up with the patient after provider conversation with the patient regarding recommendation for care management engagement and subsequent re-referral to the care management team.   [place]  [email or web address]   ??[phone]

## 2018-12-04 NOTE — Telephone Encounter (Signed)
I left the pt a message to call the office back. I called the pt to let her know that Dr.Sanders got a fax from [place] that they lost their patent exclusivity to the Celebrex and that now the generic alternatives are reasonably priced at $30 or less for a 30 day supply and will no longer be offered through [employer] patient assistance program.  Spoke with pt, pt stated she is aware she received a letter from the company herself stating that she will still be eligible until the end of the year to receive the medication.

## 2018-12-04 NOTE — Telephone Encounter (Signed)
Called [facility] to refill pt medications celebrex, and effexor order ID# [id number] and [phone] 7-10 business days. Pt will have 1 more refill left this one

## 2018-12-13 ENCOUNTER — Other Ambulatory Visit: Payer: Self-pay | Admitting: Internal Medicine

## 2018-12-20 ENCOUNTER — Encounter: Payer: Medicare HMO | Admitting: Internal Medicine

## 2018-12-20 ENCOUNTER — Ambulatory Visit: Payer: Medicare HMO

## 2018-12-20 ENCOUNTER — Encounter: Payer: Self-pay | Admitting: Internal Medicine

## 2018-12-20 ENCOUNTER — Ambulatory Visit: Payer: Self-pay

## 2019-01-09 ENCOUNTER — Other Ambulatory Visit: Payer: Self-pay | Admitting: Internal Medicine

## 2019-01-09 DIAGNOSIS — Z1231 Encounter for screening mammogram for malignant neoplasm of breast: Secondary | ICD-10-CM

## 2019-01-19 ENCOUNTER — Other Ambulatory Visit: Payer: Medicare HMO

## 2019-01-26 ENCOUNTER — Telehealth: Payer: Self-pay | Admitting: Internal Medicine

## 2019-01-26 NOTE — Telephone Encounter (Signed)
I left a message asking the patient to call me to schedule AWV with Pamala Hurry in either October or November before she comes to see Dr. Baird Cancer in December.

## 2019-01-29 ENCOUNTER — Telehealth: Payer: Self-pay

## 2019-01-29 NOTE — Telephone Encounter (Signed)
SPOKE WITH PATIENT STATES THAT SHE WANTS TO KEEP AWV APPT WHERE IT IS AT BECAUSE SHE DOES NOT WANT TO COME TWICE.ADV I WILL NOTIFY [facility] TEAM

## 2019-01-31 ENCOUNTER — Other Ambulatory Visit: Payer: Self-pay

## 2019-01-31 ENCOUNTER — Ambulatory Visit
Admission: RE | Admit: 2019-01-31 | Discharge: 2019-01-31 | Disposition: A | Discharge status: Home / Self Care | Payer: Medicare HMO | Source: Ambulatory Visit | Attending: Internal Medicine | Admitting: Internal Medicine

## 2019-01-31 DIAGNOSIS — Z1231 Encounter for screening mammogram for malignant neoplasm of breast: Secondary | ICD-10-CM

## 2019-02-05 ENCOUNTER — Other Ambulatory Visit: Payer: Self-pay | Admitting: Internal Medicine

## 2019-02-05 ENCOUNTER — Other Ambulatory Visit: Payer: Self-pay | Admitting: Nurse Practitioner

## 2019-02-07 ENCOUNTER — Telehealth: Payer: Self-pay

## 2019-02-07 NOTE — Telephone Encounter (Signed)
Left the pt a message that she needed to come sign her paperwork for the pt assistance program for Nesina.

## 2019-02-14 ENCOUNTER — Other Ambulatory Visit: Payer: Self-pay | Admitting: Nurse Practitioner

## 2019-03-07 ENCOUNTER — Other Ambulatory Visit: Payer: Self-pay

## 2019-03-07 ENCOUNTER — Ambulatory Visit (INDEPENDENT_AMBULATORY_CARE_PROVIDER_SITE_OTHER): Payer: Medicare HMO

## 2019-03-07 ENCOUNTER — Telehealth: Payer: Self-pay

## 2019-03-07 ENCOUNTER — Ambulatory Visit (INDEPENDENT_AMBULATORY_CARE_PROVIDER_SITE_OTHER): Payer: Medicare HMO | Admitting: Internal Medicine

## 2019-03-07 ENCOUNTER — Encounter: Payer: Self-pay | Admitting: Internal Medicine

## 2019-03-07 VITALS — BP 132/76 | HR 77 | Temp 98.3°F | Ht 61.2 in | Wt 149.2 lb

## 2019-03-07 DIAGNOSIS — E1165 Type 2 diabetes mellitus with hyperglycemia: Secondary | ICD-10-CM | POA: Diagnosis not present

## 2019-03-07 DIAGNOSIS — Z1211 Encounter for screening for malignant neoplasm of colon: Secondary | ICD-10-CM

## 2019-03-07 DIAGNOSIS — I10 Essential (primary) hypertension: Secondary | ICD-10-CM | POA: Diagnosis not present

## 2019-03-07 DIAGNOSIS — Z0000 Encounter for general adult medical examination without abnormal findings: Secondary | ICD-10-CM

## 2019-03-07 DIAGNOSIS — E663 Overweight: Secondary | ICD-10-CM | POA: Diagnosis not present

## 2019-03-07 DIAGNOSIS — Z6828 Body mass index (BMI) 28.0-28.9, adult: Secondary | ICD-10-CM | POA: Diagnosis not present

## 2019-03-07 LAB — POCT URINALYSIS DIPSTICK
Bilirubin, UA: NEGATIVE
Glucose, UA: NEGATIVE
Ketones, UA: NEGATIVE
Nitrite, UA: NEGATIVE
Protein, UA: NEGATIVE
Spec Grav, UA: 1.01 (ref 1.010–1.025)
Urobilinogen, UA: 0.2 E.U./dL
pH, UA: 6 (ref 5.0–8.0)

## 2019-03-07 LAB — POCT UA - MICROALBUMIN
Albumin/Creatinine Ratio, Urine, POC: <30
Creatinine, POC: 50 mg/dL
Microalbumin Ur, POC: 10 mg/L

## 2019-03-07 MED ORDER — VENLAFAXINE HCL 37.5 MG PO TABS: 37.5000 mg | ORAL_TABLET | Freq: Every day | ORAL | 1 refills | Status: DC

## 2019-03-07 NOTE — Patient Instructions (Signed)
Health Maintenance, Female Adopting a healthy lifestyle and getting preventive care are important in promoting health and wellness. Ask your health care provider about:  The right schedule for you to have regular tests and exams.  Things you can do on your own to prevent diseases and keep yourself healthy. What should I know about diet, weight, and exercise? Eat a healthy diet   Eat a diet that includes plenty of vegetables, fruits, low-fat dairy products, and lean protein.  Do not eat a lot of foods that are high in solid fats, added sugars, or sodium. Maintain a healthy weight Body mass index (BMI) is used to identify weight problems. It estimates body fat based on height and weight. Your health care provider can help determine your BMI and help you achieve or maintain a healthy weight. Get regular exercise Get regular exercise. This is one of the most important things you can do for your health. Most adults should:  Exercise for at least 150 minutes each week. The exercise should increase your heart rate and make you sweat (moderate-intensity exercise).  Do strengthening exercises at least twice a week. This is in addition to the moderate-intensity exercise.  Spend less time sitting. Even light physical activity can be beneficial. Watch cholesterol and blood lipids Have your blood tested for lipids and cholesterol at 74 years of age, then have this test every 5 years. Have your cholesterol levels checked more often if:  Your lipid or cholesterol levels are high.  You are older than 74 years of age.  You are at high risk for heart disease. What should I know about cancer screening? Depending on your health history and family history, you may need to have cancer screening at various ages. This may include screening for:  Breast cancer.  Cervical cancer.  Colorectal cancer.  Skin cancer.  Lung cancer. What should I know about heart disease, diabetes, and high blood  pressure? Blood pressure and heart disease  High blood pressure causes heart disease and increases the risk of stroke. This is more likely to develop in people who have high blood pressure readings, are of African descent, or are overweight.  Have your blood pressure checked: ? Every 3-5 years if you are 18-39 years of age. ? Every year if you are 40 years old or older. Diabetes Have regular diabetes screenings. This checks your fasting blood sugar level. Have the screening done:  Once every three years after age 40 if you are at a normal weight and have a low risk for diabetes.  More often and at a younger age if you are overweight or have a high risk for diabetes. What should I know about preventing infection? Hepatitis B If you have a higher risk for hepatitis B, you should be screened for this virus. Talk with your health care provider to find out if you are at risk for hepatitis B infection. Hepatitis C Testing is recommended for:  Everyone born from 1945 through 1965.  Anyone with known risk factors for hepatitis C. Sexually transmitted infections (STIs)  Get screened for STIs, including gonorrhea and chlamydia, if: ? You are sexually active and are younger than 74 years of age. ? You are older than 74 years of age and your health care provider tells you that you are at risk for this type of infection. ? Your sexual activity has changed since you were last screened, and you are at increased risk for chlamydia or gonorrhea. Ask your health care provider if   you are at risk.  Ask your health care provider about whether you are at high risk for HIV. Your health care provider may recommend a prescription medicine to help prevent HIV infection. If you choose to take medicine to prevent HIV, you should first get tested for HIV. You should then be tested every 3 months for as long as you are taking the medicine. Pregnancy  If you are about to stop having your period (premenopausal) and  you may become pregnant, seek counseling before you get pregnant.  Take 400 to 800 micrograms (mcg) of folic acid every day if you become pregnant.  Ask for birth control (contraception) if you want to prevent pregnancy. Osteoporosis and menopause Osteoporosis is a disease in which the bones lose minerals and strength with aging. This can result in bone fractures. If you are 65 years old or older, or if you are at risk for osteoporosis and fractures, ask your health care provider if you should:  Be screened for bone loss.  Take a calcium or vitamin D supplement to lower your risk of fractures.  Be given hormone replacement therapy (HRT) to treat symptoms of menopause. Follow these instructions at home: Lifestyle  Do not use any products that contain nicotine or tobacco, such as cigarettes, e-cigarettes, and chewing tobacco. If you need help quitting, ask your health care provider.  Do not use street drugs.  Do not share needles.  Ask your health care provider for help if you need support or information about quitting drugs. Alcohol use  Do not drink alcohol if: ? Your health care provider tells you not to drink. ? You are pregnant, may be pregnant, or are planning to become pregnant.  If you drink alcohol: ? Limit how much you use to 0-1 drink a day. ? Limit intake if you are breastfeeding.  Be aware of how much alcohol is in your drink. In the [place] one drink equals one 12 oz bottle of beer (355 mL), one 5 oz glass of wine (148 mL), or one 1 oz glass of hard liquor (44 mL). General instructions  Schedule regular health, dental, and eye exams.  Stay current with your vaccines.  Tell your health care provider if: ? You often feel depressed. ? You have ever been abused or do not feel safe at home. Summary  Adopting a healthy lifestyle and getting preventive care are important in promoting health and wellness.  Follow your health care provider's instructions about healthy  diet, exercising, and getting tested or screened for diseases.  Follow your health care provider's instructions on monitoring your cholesterol and blood pressure. This information is not intended to replace advice given to you by your health care provider. Make sure you discuss any questions you have with your health care provider. Document Released: 10/05/2010 Document Revised: 03/15/2018 Document Reviewed: 03/15/2018 Elsevier Patient Education  2020 [employer]  

## 2019-03-07 NOTE — Patient Instructions (Signed)
Virginia Russell , Thank you for taking time to come for your Medicare Wellness Visit. I appreciate your ongoing commitment to your health goals. Please review the following plan we discussed and let me know if I can assist you in the future.   Screening recommendations/referrals: Colonoscopy: 08/2009 Mammogram: 01/2019 Bone Density: 10/2016 Recommended yearly ophthalmology/optometry visit for glaucoma screening and checkup Recommended yearly dental visit for hygiene and checkup  Vaccinations: Influenza vaccine: 12/2018 Pneumococcal vaccine: 12/2018 Tdap vaccine: 12/2012 Shingles vaccine: discussed    Advanced directives: Please bring a copy of your POA (Power of [place]) and/or Living Will to your next appointment.    Conditions/risks identified: overweight  Next appointment:    Preventive Care 74 Years and Older, Female Preventive care refers to lifestyle choices and visits with your health care provider that can promote health and wellness. What does preventive care include?  A yearly physical exam. This is also called an annual well check.  Dental exams once or twice a year.  Routine eye exams. Ask your health care provider how often you should have your eyes checked.  Personal lifestyle choices, including:  Daily care of your teeth and gums.  Regular physical activity.  Eating a healthy diet.  Avoiding tobacco and drug use.  Limiting alcohol use.  Practicing safe sex.  Taking low-dose aspirin every day.  Taking vitamin and mineral supplements as recommended by your health care provider. What happens during an annual well check? The services and screenings done by your health care provider during your annual well check will depend on your age, overall health, lifestyle risk factors, and family history of disease. Counseling  Your health care provider may ask you questions about your:  Alcohol use.  Tobacco use.  Drug use.  Emotional well-being.  Home and  relationship well-being.  Sexual activity.  Eating habits.  History of falls.  Memory and ability to understand (cognition).  Work and work Statistician.  Reproductive health. Screening  You may have the following tests or measurements:  Height, weight, and BMI.  Blood pressure.  Lipid and cholesterol levels. These may be checked every 5 years, or more frequently if you are over 60 years old.  Skin check.  Lung cancer screening. You may have this screening every year starting at age 4 if you have a 30-pack-year history of smoking and currently smoke or have quit within the past 15 years.  Fecal occult blood test (FOBT) of the stool. You may have this test every year starting at age 52.  Flexible sigmoidoscopy or colonoscopy. You may have a sigmoidoscopy every 5 years or a colonoscopy every 10 years starting at age 52.  Hepatitis C blood test.  Hepatitis B blood test.  Sexually transmitted disease (STD) testing.  Diabetes screening. This is done by checking your blood sugar (glucose) after you have not eaten for a while (fasting). You may have this done every 1-3 years.  Bone density scan. This is done to screen for osteoporosis. You may have this done starting at age 2.  Mammogram. This may be done every 1-2 years. Talk to your health care provider about how often you should have regular mammograms. Talk with your health care provider about your test results, treatment options, and if necessary, the need for more tests. Vaccines  Your health care provider may recommend certain vaccines, such as:  Influenza vaccine. This is recommended every year.  Tetanus, diphtheria, and acellular pertussis (Tdap, Td) vaccine. You may need a Td booster every 10  years.  Zoster vaccine. You may need this after age 60.  Pneumococcal 13-valent conjugate (PCV13) vaccine. One dose is recommended after age 57.  Pneumococcal polysaccharide (PPSV23) vaccine. One dose is recommended after  age 52. Talk to your health care provider about which screenings and vaccines you need and how often you need them. This information is not intended to replace advice given to you by your health care provider. Make sure you discuss any questions you have with your health care provider. Document Released: 04/18/2015 Document Revised: 12/10/2015 Document Reviewed: 01/21/2015 Elsevier Interactive Patient Education  2017 [place] Prevention in the Home Falls can cause injuries. They can happen to people of all ages. There are many things you can do to make your home safe and to help prevent falls. What can I do on the outside of my home?  Regularly fix the edges of walkways and driveways and fix any cracks.  Remove anything that might make you trip as you walk through a door, such as a raised step or threshold.  Trim any bushes or trees on the path to your home.  Use bright outdoor lighting.  Clear any walking paths of anything that might make someone trip, such as rocks or tools.  Regularly check to see if handrails are loose or broken. Make sure that both sides of any steps have handrails.  Any raised decks and porches should have guardrails on the edges.  Have any leaves, snow, or ice cleared regularly.  Use sand or salt on walking paths during winter.  Clean up any spills in your garage right away. This includes oil or grease spills. What can I do in the bathroom?  Use night lights.  Install grab bars by the toilet and in the tub and shower. Do not use towel bars as grab bars.  Use non-skid mats or decals in the tub or shower.  If you need to sit down in the shower, use a plastic, non-slip stool.  Keep the floor dry. Clean up any water that spills on the floor as soon as it happens.  Remove soap buildup in the tub or shower regularly.  Attach bath mats securely with double-sided non-slip rug tape.  Do not have throw rugs and other things on the floor that can  make you trip. What can I do in the bedroom?  Use night lights.  Make sure that you have a light by your bed that is easy to reach.  Do not use any sheets or blankets that are too big for your bed. They should not hang down onto the floor.  Have a firm chair that has side arms. You can use this for support while you get dressed.  Do not have throw rugs and other things on the floor that can make you trip. What can I do in the kitchen?  Clean up any spills right away.  Avoid walking on wet floors.  Keep items that you use a lot in easy-to-reach places.  If you need to reach something above you, use a strong step stool that has a grab bar.  Keep electrical cords out of the way.  Do not use floor polish or wax that makes floors slippery. If you must use wax, use non-skid floor wax.  Do not have throw rugs and other things on the floor that can make you trip. What can I do with my stairs?  Do not leave any items on the stairs.  Make sure that  there are handrails on both sides of the stairs and use them. Fix handrails that are broken or loose. Make sure that handrails are as long as the stairways.  Check any carpeting to make sure that it is firmly attached to the stairs. Fix any carpet that is loose or worn.  Avoid having throw rugs at the top or bottom of the stairs. If you do have throw rugs, attach them to the floor with carpet tape.  Make sure that you have a light switch at the top of the stairs and the bottom of the stairs. If you do not have them, ask someone to add them for you. What else can I do to help prevent falls?  Wear shoes that:  Do not have high heels.  Have rubber bottoms.  Are comfortable and fit you well.  Are closed at the toe. Do not wear sandals.  If you use a stepladder:  Make sure that it is fully opened. Do not climb a closed stepladder.  Make sure that both sides of the stepladder are locked into place.  Ask someone to hold it for you,  if possible.  Clearly mark and make sure that you can see:  Any grab bars or handrails.  First and last steps.  Where the edge of each step is.  Use tools that help you move around (mobility aids) if they are needed. These include:  Canes.  Walkers.  Scooters.  Crutches.  Turn on the lights when you go into a dark area. Replace any light bulbs as soon as they burn out.  Set up your furniture so you have a clear path. Avoid moving your furniture around.  If any of your floors are uneven, fix them.  If there are any pets around you, be aware of where they are.  Review your medicines with your doctor. Some medicines can make you feel dizzy. This can increase your chance of falling. Ask your doctor what other things that you can do to help prevent falls. This information is not intended to replace advice given to you by your health care provider. Make sure you discuss any questions you have with your health care provider. Document Released: 01/16/2009 Document Revised: 08/28/2015 Document Reviewed: 04/26/2014 Elsevier Interactive Patient Education  2017 [employer].

## 2019-03-07 NOTE — Progress Notes (Signed)
This visit occurred during the SARS-CoV-2 public health emergency.  Safety protocols were in place, including screening questions prior to the visit, additional usage of staff PPE, and extensive cleaning of exam room while observing appropriate contact time as indicated for disinfecting solutions.  Subjective:     Patient ID: Virginia Russell , female    DOB: 1945-08-20 , 73 y.o.   MRN: [id number]   Chief Complaint  Patient presents with  . Annual Exam    HPI  She is here today for a full physical examination. She is no longer followed by GYN. She has no specific concerns or complaints at this time.   Diabetes She presents for her follow-up diabetic visit. She has type 2 diabetes mellitus. Her disease course has been stable. There are no hypoglycemic associated symptoms. Pertinent negatives for diabetes include no blurred vision and no chest pain. There are no hypoglycemic complications. Risk factors for coronary artery disease include dyslipidemia, diabetes mellitus, hypertension, post-menopausal and stress. She is compliant with treatment most of the time. She is following a diabetic diet. She participates in exercise intermittently. Her breakfast blood glucose is taken between 7-8 am. Her breakfast blood glucose range is generally 90-110 mg/dl. An ACE inhibitor/angiotensin II receptor blocker is being taken. Eye exam is current.  Hypertension This is a chronic problem. The current episode started more than 1 year ago. The problem has been gradually improving since onset. The problem is controlled. Pertinent negatives include no blurred vision, chest pain, palpitations or shortness of breath. Past treatments include ACE inhibitors and diuretics. The current treatment provides moderate improvement.     Past Medical History:  Diagnosis Date  . Anxiety   . Diabetes mellitus without complication ([place])   . High cholesterol   . HTN (hypertension)      Family History  Problem Relation  Age of Onset  . Breast cancer Mother   . Heart disease Mother   . Stroke Father   . Heart attack Father   . Heart attack Daughter      Current Outpatient Medications:  .  Accu-Chek Softclix Lancets lancets, Use as instructed to check blood sugars 1 time per day dx: e11.22, Disp: 150 each, Rfl: 3 .  Alcohol Swabs (B-D SINGLE USE SWABS BUTTERFLY) PADS, 100 Pieces by Does not apply route as needed., Disp: 100 each, Rfl: 3 .  Alogliptin Benzoate (NESINA) 25 MG TABS, Take 1 tablet by mouth daily., Disp: 90 tablet, Rfl: 1 .  aspirin 81 MG tablet, Take 81 mg by mouth daily., Disp: , Rfl:  .  atorvastatin (LIPITOR) 40 MG tablet, TAKE 1 TABLET AT NIGHT, Disp: 90 tablet, Rfl: 1 .  Blood Glucose Monitoring Suppl (ACCU-CHEK AVIVA PLUS) w/Device KIT, Use as directed to check blood sugars 1 time per day dx: e11.22, Disp: 1 kit, Rfl: 1 .  celecoxib (CELEBREX) 200 MG capsule, Take 1 capsule (200 mg total) by mouth 2 (two) times daily., Disp: 180 capsule, Rfl: 1 .  glucose blood (ACCU-CHEK AVIVA PLUS) test strip, Use as instructed to check blood sugars 1 time per day dx: e11.22, Disp: 150 each, Rfl: 3 .  losartan (COZAAR) 50 MG tablet, TAKE 1 TABLET EVERY DAY, Disp: 90 tablet, Rfl: 1 .  metFORMIN (GLUCOPHAGE-XR) 750 MG 24 hr tablet, TAKE 2 TABLETS EVERY EVENING, Disp: 180 tablet, Rfl: 1 .  pioglitazone (ACTOS) 30 MG tablet, TAKE 1 TABLET EVERY DAY, Disp: 90 tablet, Rfl: 1 .  venlafaxine (EFFEXOR) 37.5 MG tablet, Take 1 tablet (  37.5 mg total) by mouth daily., Disp: 90 tablet, Rfl: 1   Allergies  Allergen Reactions  . Codeine      The patient states she uses post menopausal status for birth control. Last LMP was No LMP recorded. Patient has had a hysterectomy.. Negative for Dysmenorrhea Negative for: breast discharge, breast lump(s), breast pain and breast self exam. Associated symptoms include abnormal vaginal bleeding. Pertinent negatives include abnormal bleeding (hematology), anxiety, decreased libido,  depression, difficulty falling sleep, dyspareunia, history of infertility, nocturia, sexual dysfunction, sleep disturbances, urinary incontinence, urinary urgency, vaginal discharge and vaginal itching. Diet regular.The patient states her exercise level is  intermittent. 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   . The patient's tobacco use is:  Social History   Tobacco Use  Smoking Status Never Smoker  Smokeless Tobacco Never Used  . She has been exposed to passive smoke. The patient's alcohol use is:  Social History   Substance and Sexual Activity  Alcohol Use No    Review of Systems  Constitutional: Negative.   HENT: Negative.   Eyes: Negative.  Negative for blurred vision.  Respiratory: Negative.  Negative for shortness of breath.   Cardiovascular: Negative.  Negative for chest pain and palpitations.  Endocrine: Negative.   Genitourinary: Negative.   Musculoskeletal: Negative.   Skin: Negative.   Allergic/Immunologic: Negative.   Neurological: Negative.   Hematological: Negative.   Psychiatric/Behavioral: Negative.      Today's Vitals   03/07/19 1508  BP: 132/76  Pulse: 77  Temp: 98.3 F (36.8 C)  TempSrc: Oral  Weight: 149 lb 3.2 oz (67.7 kg)  Height: 5' 1.2" (1.554 m)  PainSc: 0-No pain   Body mass index is 28.01 kg/m.   Objective:  Physical Exam Vitals signs and nursing note reviewed.  Constitutional:      Appearance: Normal appearance. She is obese.  HENT:     Head: Normocephalic and atraumatic.     Right Ear: Tympanic membrane, ear canal and external ear normal.     Left Ear: Tympanic membrane, ear canal and external ear normal.     Nose: Nose normal.     Mouth/Throat:     Mouth: Mucous membranes are moist.     Pharynx: Oropharynx is clear.  Eyes:     Extraocular Movements: Extraocular movements intact.     Conjunctiva/sclera: Conjunctivae normal.     Pupils: Pupils are equal, round, and reactive to light.  Neck:      Musculoskeletal: Normal range of motion and neck supple.  Cardiovascular:     Rate and Rhythm: Normal rate and regular rhythm.     Pulses: Normal pulses.     Heart sounds: Normal heart sounds.  Pulmonary:     Effort: Pulmonary effort is normal.     Breath sounds: Normal breath sounds.  Chest:     Breasts: Tanner Score is 5.        Right: Normal.        Left: Normal.  Abdominal:     General: Abdomen is flat. Bowel sounds are normal.     Palpations: Abdomen is soft.  Genitourinary:    Comments: deferred Musculoskeletal: Normal range of motion.  Skin:    General: Skin is warm and dry.  Neurological:     General: No focal deficit present.     Mental Status: She is alert and oriented to person, place, and time.  Psychiatric:        Mood  and Affect: Mood normal.        Behavior: Behavior normal.         Assessment And Plan:     1. Routine general medical examination at health care facility  A full exam was performed. Importance of monthly self bresat exams was discussed with the patient.  PATIENT HAS BEEN ADVISED TO GET 30-45 MINUTES REGULAR EXERCISE NO LESS THAN FOUR TO FIVE DAYS PER WEEK - BOTH WEIGHTBEARING EXERCISES AND AEROBIC ARE RECOMMENDED.  SHE WAS ADVISED TO FOLLOW A HEALTHY DIET WITH AT LEAST SIX FRUITS/VEGGIES PER DAY, DECREASE INTAKE OF RED MEAT, AND TO INCREASE FISH INTAKE TO TWO DAYS PER WEEK.  MEATS/FISH SHOULD NOT BE FRIED, BAKED OR BROILED IS PREFERABLE.  I SUGGEST WEARING SPF 50 SUNSCREEN ON EXPOSED PARTS AND ESPECIALLY WHEN IN THE DIRECT SUNLIGHT FOR AN EXTENDED PERIOD OF TIME.  PLEASE AVOID FAST FOOD RESTAURANTS AND INCREASE YOUR WATER INTAKE.   2. Uncontrolled type 2 diabetes mellitus with hyperglycemia ([place])  Diabetic foot exam was performed at her last visit. I DISCUSSED WITH THE PATIENT AT LENGTH REGARDING THE GOALS OF GLYCEMIC CONTROL AND POSSIBLE LONG-TERM COMPLICATIONS.  I  ALSO STRESSED THE IMPORTANCE OF COMPLIANCE WITH HOME GLUCOSE MONITORING,  DIETARY RESTRICTIONS INCLUDING AVOIDANCE OF SUGARY DRINKS/PROCESSED FOODS,  ALONG WITH REGULAR EXERCISE.  I  ALSO STRESSED THE IMPORTANCE OF ANNUAL EYE EXAMS, SELF FOOT CARE AND COMPLIANCE WITH OFFICE VISITS.  - CMP14+EGFR - CBC - Lipid panel - Hemoglobin A1c - EKG 12-Lead - POCT Urinalysis Dipstick (81002) - POCT UA - Microalbumin  3. Essential hypertension, benign  Chronic, controlled. She will continue with current meds. She is encouraged to avoid adding salt to her foods. EKG performed, no new changes noted. She will rto in six months for re-evaluation.   4. Screen for colon cancer  She does not wish to have a colonoscopy. She does agree to [employer] testing. She verbalizes understanding that she will need to have a colonoscopy if the Cologuard results are positive.  - Cologuard  5. Overweight with body mass index (BMI) of 28 to 28.9 in adult  Importance of achieving optimal weight to decrease risk of cardiovascular disease and cancers was discussed with the patient in full detail. She is encouraged to start slowly - start with 10 minutes twice daily at least three to four days per week and to gradually build to 30 minutes five days weekly. She was given tips to incorporate more activity into her daily routine - take stairs when possible, park farther away from her job, grocery stores, etc.    Maximino Greenland, MD    THE PATIENT IS ENCOURAGED TO PRACTICE SOCIAL DISTANCING DUE TO THE COVID-19 PANDEMIC.

## 2019-03-07 NOTE — Telephone Encounter (Signed)
I left the pt a message to call back.  I was calling the pt let her know that I called [facility] pt assistance [phone] pt id number [id number], and got a response from the automated refill system that the celebrex can't be refilled at this time.

## 2019-03-07 NOTE — Progress Notes (Signed)
This visit occurred during the SARS-CoV-2 public health emergency.  Safety protocols were in place, including screening questions prior to the visit, additional usage of staff PPE, and extensive cleaning of exam room while observing appropriate contact time as indicated for disinfecting solutions.  Subjective:   Salah Burlison is a 73 y.o. female who presents for Medicare Annual (Subsequent) preventive examination.  Review of Systems:  n/a Cardiac Risk Factors include: advanced age (>38mn, >>75women);diabetes mellitus;hypertension     Objective:     Vitals: BP 132/76 (Patient Position: Sitting)   Pulse 77   Temp 98.3 F (36.8 C) (Oral)   Ht 5' 1.2" (1.554 m)   Wt 149 lb 3.2 oz (67.7 kg)   BMI 28.01 kg/m   Body mass index is 28.01 kg/m.  Advanced Directives 03/07/2019  Does Patient Have a Medical Advance Directive? Yes  Type of [place]amed[place]  Copy of [place]? No - copy requested    Tobacco Social History   Tobacco Use  Smoking Status Never Smoker  Smokeless Tobacco Never Used     Counseling given: Not Answered   Clinical Intake:  Pre-visit preparation completed: Yes  Pain : No/denies pain     Nutritional Status: BMI 25 -29 Overweight Nutritional Risks: None Diabetes: Yes CBG done?: No Did pt. bring in CBG monitor from home?: No  How often do you need to have someone help you when you read instructions, pamphlets, or other written materials from your doctor or pharmacy?: 1 - Never What is the last grade level you completed in school?: 10th grade  Interpreter Needed?: No  Information entered by :: NAllen LPN  Past Medical History:  Diagnosis Date  . Anxiety   . Diabetes mellitus [place] complication [place]   . High cholesterol   . HTN (hypertension)    Past Surgical History:  Procedure Laterality Date  . ABDOMINAL HYSTERECTOMY     ABDOMINAL HYSTERECTOMY  . BACK  SURGERY    . BREAST BIOPSY Left   . NECK SURGERY    . SHOULDER SURGERY     X2   Family History  Problem Relation Age of Onset  . Breast cancer Mother   . Heart disease Mother   . Stroke Father   . Heart attack Father   . Heart attack Daughter    Social History   Socioeconomic History  . Marital status: Married    Spouse name: Not on file  . Number of children: Not on file  . Years of education: Not on file  . Highest education level: Not on file  Occupational History  . Occupation: retired  SScientific laboratory technician . Financial resource strain: Not hard at all  . Food insecurity    Worry: Never true    Inability: Never true  . Transportation needs    Medical: No    Non-medical: No  Tobacco Use  . Smoking status: Never Smoker  . Smokeless tobacco: Never Used  Substance and Sexual Activity  . Alcohol use: No  . Drug use: Never  . Sexual activity: Not Currently  Lifestyle  . Physical activity    Days per week: 0 days    Minutes per session: 0 min  . Stress: Not at all  Relationships  . Social [place] phone: Not on file    Gets together: Not on file    Attends religious service: Not on file    Active  member of club or organization: Not on file    Attends meetings of clubs or organizations: Not on file    Relationship status: Not on file  Other Topics Concern  . Not on file  Social History Narrative  . Not on file    Outpatient Encounter Medications as of 03/07/2019  Medication Sig  . Accu-Chek Softclix Lancets lancets Use as instructed to check blood sugars 1 time per day dx: e11.22  . Alcohol Swabs (B-D SINGLE USE SWABS BUTTERFLY) PADS 100 Pieces by Does not apply route as needed.  . Alogliptin Benzoate (NESINA) 25 MG TABS Take 1 tablet by mouth daily.  Marland Kitchen aspirin 81 MG tablet Take 81 mg by mouth daily.  Marland Kitchen atorvastatin (LIPITOR) 40 MG tablet TAKE 1 TABLET AT NIGHT  . Blood Glucose Monitoring Suppl (ACCU-CHEK AVIVA PLUS) w/Device KIT Use as directed to  check blood sugars 1 time per day dx: e11.22  . celecoxib (CELEBREX) 200 MG capsule Take 1 capsule (200 mg total) by mouth 2 (two) times daily.  Marland Kitchen glucose blood (ACCU-CHEK AVIVA PLUS) test strip Use as instructed to check blood sugars 1 time per day dx: e11.22  . losartan (COZAAR) 50 MG tablet TAKE 1 TABLET EVERY DAY  . metFORMIN (GLUCOPHAGE-XR) 750 MG 24 hr tablet TAKE 2 TABLETS EVERY EVENING  . pioglitazone (ACTOS) 30 MG tablet TAKE 1 TABLET EVERY DAY  . venlafaxine (EFFEXOR) 37.5 MG tablet Take 1 tablet (37.5 mg total) by mouth daily.   No facility-administered encounter medications on file as of 03/07/2019.     Activities of Daily Living In your present state of health, do you have any difficulty performing the following activities: 03/07/2019 03/07/2019  Hearing? N N  Vision? N N  Difficulty concentrating or making decisions? N N  Walking or climbing stairs? N N  Dressing or bathing? N N  Doing errands, shopping? N N  Preparing Food and eating ? N -  Using the Toilet? N -  In the past six months, have you accidently leaked urine? N -  Do you have problems with loss of bowel control? N -  Managing your Medications? N -  Managing your Finances? N -  Housekeeping or managing your Housekeeping? N -  Some recent data might be hidden    Patient Care Team: Glendale Chard, MD as PCP - General (Internal Medicine)    Assessment:   This is a routine wellness examination for [place].  Exercise Activities and Dietary recommendations Current Exercise Habits: The patient has a physically strenuous job, but has no regular exercise apart from work.  Goals    . Patient Stated     03/07/2019, wants to start walking 4-6 miles again       Fall Risk Fall Risk  03/07/2019 03/07/2019 09/19/2018 05/15/2018  Falls in the past year? 0 0 0 0  Risk for fall due to : Medication side effect - - -  Follow up Falls evaluation completed;Education provided;Falls prevention discussed - - -   Is the  patient's home free of loose throw rugs in walkways, pet beds, electrical cords, etc?   yes      Grab bars in the bathroom? yes      Handrails on the stairs?   yes      Adequate lighting?   yes  Timed Get Up and Go performed: n/a  Depression Screen PHQ 2/9 Scores 03/07/2019 03/07/2019 09/19/2018 05/15/2018  PHQ - 2 Score 0 0 0 0  PHQ- 9 Score  0 0 - -     Cognitive Function     6CIT Screen 03/07/2019  What Year? 0 points  What month? 0 points  What time? 0 points  Count back from 20 0 points  Months in reverse 4 points  Repeat phrase 8 points  Total Score 12    Immunization History  Administered Date(s) Administered  . Fluad Quad(high Dose 65+) 12/25/2018  . Pneumococcal Conjugate-13 12/19/2017  . Pneumococcal Polysaccharide-23 12/25/2018    Qualifies for Shingles Vaccine? yes  Screening Tests Health Maintenance  Topic Date Due  . OPHTHALMOLOGY EXAM  11/27/1955  . HEMOGLOBIN A1C  03/21/2019  . COLONOSCOPY  09/03/2019  . FOOT EXAM  09/19/2019  . MAMMOGRAM  01/30/2021  . TETANUS/TDAP  01/10/2023  . INFLUENZA VACCINE  Completed  . DEXA SCAN  Completed  . Hepatitis C Screening  Completed  . PNA vac Low Risk Adult  Completed    Cancer Screenings: Lung: Low Dose CT Chest recommended if Age 39-80 years, 30 pack-year currently smoking OR have quit w/in 15years. Patient does not qualify. Breast:  Up to date on Mammogram? Yes   Up to date of Bone Density/Dexa? Yes Colorectal: up to date  Additional Screenings: : Hepatitis C Screening:      Plan:    Patient wants to start walking 4-6 miles again.   I have personally reviewed and noted the following in the patient's chart:   . Medical and social history . Use of alcohol, tobacco or illicit drugs  . Current medications and supplements . Functional ability and status . Nutritional status . Physical activity . Advanced directives . List of other physicians . Hospitalizations, surgeries, and ER visits in previous 12  months . Vitals . Screenings to include cognitive, depression, and falls . Referrals and appointments  In addition, I have reviewed and discussed with patient certain preventive protocols, quality metrics, and best practice recommendations. A written personalized care plan for preventive services as well as general preventive health recommendations were provided to patient.     Kellie Simmering, LPN  [id number]

## 2019-03-08 ENCOUNTER — Other Ambulatory Visit: Payer: Self-pay

## 2019-03-08 LAB — LIPID PANEL
Chol/HDL Ratio: 2.2 ratio (ref 0.0–4.4)
Cholesterol, Total: 159 mg/dL (ref 100–199)
HDL: 71 mg/dL (ref >39)
LDL Chol Calc (NIH): 74 mg/dL (ref 0–99)
Triglycerides: 72 mg/dL (ref 0–149)
VLDL Cholesterol Cal: 14 mg/dL (ref 5–40)

## 2019-03-08 LAB — CBC
Hematocrit: 38.2 % (ref 34.0–46.6)
Hemoglobin: 12.6 g/dL (ref 11.1–15.9)
MCH: 31.6 pg (ref 26.6–33.0)
MCHC: 33 g/dL (ref 31.5–35.7)
MCV: 96 fL (ref 79–97)
Platelets: 144 10*3/uL — ABNORMAL LOW (ref 150–450)
RBC: 3.99 x10E6/uL (ref 3.77–5.28)
RDW: 12.5 % (ref 11.7–15.4)
WBC: 6.8 10*3/uL (ref 3.4–10.8)

## 2019-03-08 LAB — CMP14+EGFR
ALT: 10 IU/L (ref 0–32)
AST: 17 IU/L (ref 0–40)
Albumin/Globulin Ratio: 1.8 (ref 1.2–2.2)
Albumin: 4.4 g/dL (ref 3.7–4.7)
Alkaline Phosphatase: 60 IU/L (ref 39–117)
BUN/Creatinine Ratio: 18 (ref 12–28)
BUN: 12 mg/dL (ref 8–27)
Bilirubin Total: 0.9 mg/dL (ref 0.0–1.2)
CO2: 24 mmol/L (ref 20–29)
Calcium: 10 mg/dL (ref 8.7–10.3)
Chloride: 101 mmol/L (ref 96–106)
Creatinine, Ser: 0.65 mg/dL (ref 0.57–1.00)
GFR calc Af Amer: 101 mL/min/{1.73_m2} (ref >59)
GFR calc non Af Amer: 88 mL/min/{1.73_m2} (ref >59)
Globulin, Total: 2.4 g/dL (ref 1.5–4.5)
Glucose: 113 mg/dL — ABNORMAL HIGH (ref 65–99)
Potassium: 4.6 mmol/L (ref 3.5–5.2)
Sodium: 140 mmol/L (ref 134–144)
Total Protein: 6.8 g/dL (ref 6.0–8.5)

## 2019-03-08 LAB — HEMOGLOBIN A1C
Est. average glucose Bld gHb Est-mCnc: 163 mg/dL
Hgb A1c MFr Bld: 7.3 % — ABNORMAL HIGH (ref 4.8–5.6)

## 2019-03-08 NOTE — Telephone Encounter (Signed)
The pt was notified she needs repeat urine in 2-3 weeks. dx: abnormal urine. just u/a.

## 2019-03-09 ENCOUNTER — Telehealth: Payer: Self-pay

## 2019-03-09 NOTE — Telephone Encounter (Signed)
I left the pt a message that Dr. Baird Cancer wants to know how much is the venlafaxine for 90 days at the pt's pharmacy.

## 2019-03-14 ENCOUNTER — Telehealth: Payer: Self-pay

## 2019-03-14 NOTE — Telephone Encounter (Signed)
The pt was called and given her most recent lab results.  The pt was also asked how much was the generic Effexor and the pt said that she doesn't know because she hasn't gotten it yet from [facility].  The pt was advised to contact [facility] because the prescription was faxed on 03/07/2019

## 2019-03-21 LAB — POCT URINALYSIS DIPSTICK
Bilirubin, UA: NEGATIVE
Blood, UA: NEGATIVE
Glucose, UA: NEGATIVE
Nitrite, UA: NEGATIVE
Protein, UA: NEGATIVE
Spec Grav, UA: 1.025 (ref 1.010–1.025)
Urobilinogen, UA: 0.2 E.U./dL
pH, UA: 6 (ref 5.0–8.0)

## 2019-03-22 ENCOUNTER — Other Ambulatory Visit (INDEPENDENT_AMBULATORY_CARE_PROVIDER_SITE_OTHER): Payer: Medicare HMO

## 2019-03-22 DIAGNOSIS — R8290 Unspecified abnormal findings in urine: Secondary | ICD-10-CM

## 2019-03-27 ENCOUNTER — Ambulatory Visit: Payer: Self-pay

## 2019-04-03 ENCOUNTER — Telehealth: Payer: Self-pay

## 2019-04-03 NOTE — Telephone Encounter (Signed)
I called pt and left her a v/m to call the office we have received a letter from [employer] but not the application form that is needing to be filled out. YRL,RMA

## 2019-04-04 ENCOUNTER — Other Ambulatory Visit: Payer: Self-pay

## 2019-04-04 ENCOUNTER — Ambulatory Visit
Admission: RE | Admit: 2019-04-04 | Discharge: 2019-04-04 | Disposition: A | Discharge status: Home / Self Care | Payer: Medicare HMO | Source: Ambulatory Visit | Attending: Internal Medicine | Admitting: Internal Medicine

## 2019-04-04 DIAGNOSIS — M8580 Other specified disorders of bone density and structure, unspecified site: Secondary | ICD-10-CM

## 2019-04-04 DIAGNOSIS — Z780 Asymptomatic menopausal state: Secondary | ICD-10-CM

## 2019-04-04 DIAGNOSIS — M85852 Other specified disorders of bone density and structure, left thigh: Secondary | ICD-10-CM | POA: Diagnosis not present

## 2019-05-02 ENCOUNTER — Telehealth: Payer: Self-pay

## 2019-05-02 ENCOUNTER — Encounter: Payer: Self-pay | Admitting: Internal Medicine

## 2019-05-02 NOTE — Telephone Encounter (Signed)
Will you please ask [place] if she will check with [facility] Patient assistant program on the papers that she faxed to them in November on my diabetic medicine. (Nesina 25 mg tablets). I have not heard anything from them.  Fax-[phone]  [phone]. Paperwork was to renew my application.  Per Amparo [place] pt needs to drop off financial information, pt stated she will drop that off as soon as possible

## 2019-05-05 ENCOUNTER — Other Ambulatory Visit: Payer: Self-pay | Admitting: Internal Medicine

## 2019-05-07 ENCOUNTER — Ambulatory Visit: Payer: Self-pay | Admitting: Pharmacist

## 2019-05-07 DIAGNOSIS — E1165 Type 2 diabetes mellitus with hyperglycemia: Secondary | ICD-10-CM

## 2019-05-07 NOTE — Progress Notes (Signed)
  Chronic Care Management   Outreach Note  05/07/2019 Name: Virginia Russell [facility] MRN: [id number] DOB: [birth year]  Referred by: Glendale Chard, MD Reason for referral : Chronic Care Management   An unsuccessful telephone outreach was attempted today. The patient was referred to the case management team by for assistance with care management and care coordination.   Follow Up Plan: A HIPPA compliant phone message was left for the patient providing contact information and requesting a return call.  The care management team will reach out to the patient again over the next 10-14 days.   SIGNATURE Regina Eck, PharmD, BCPS Clinical Pharmacist, [facility] [place]: [phone]

## 2019-05-09 ENCOUNTER — Telehealth: Payer: Self-pay | Admitting: Pharmacist

## 2019-05-15 ENCOUNTER — Other Ambulatory Visit: Payer: Self-pay

## 2019-05-15 MED ORDER — ALOGLIPTIN BENZOATE 25 MG PO TABS: 1.0000 | ORAL_TABLET | Freq: Every day | ORAL | 1 refills | Status: DC

## 2019-05-27 DIAGNOSIS — Z1211 Encounter for screening for malignant neoplasm of colon: Secondary | ICD-10-CM | POA: Diagnosis not present

## 2019-05-27 LAB — COLOGUARD: Cologuard: NEGATIVE

## 2019-06-01 LAB — COLOGUARD: COLOGUARD: NEGATIVE

## 2019-06-03 ENCOUNTER — Encounter: Payer: Self-pay | Admitting: Internal Medicine

## 2019-06-04 ENCOUNTER — Encounter: Payer: Self-pay | Admitting: Internal Medicine

## 2019-06-06 ENCOUNTER — Ambulatory Visit: Payer: Medicare HMO | Admitting: Internal Medicine

## 2019-06-19 ENCOUNTER — Encounter: Payer: Self-pay | Admitting: Internal Medicine

## 2019-06-19 ENCOUNTER — Other Ambulatory Visit: Payer: Self-pay

## 2019-06-19 ENCOUNTER — Ambulatory Visit (INDEPENDENT_AMBULATORY_CARE_PROVIDER_SITE_OTHER): Payer: Medicare HMO | Admitting: Internal Medicine

## 2019-06-19 VITALS — BP 132/78 | HR 79 | Temp 97.9°F | Ht 61.2 in | Wt 153.0 lb

## 2019-06-19 DIAGNOSIS — Z6828 Body mass index (BMI) 28.0-28.9, adult: Secondary | ICD-10-CM

## 2019-06-19 DIAGNOSIS — I10 Essential (primary) hypertension: Secondary | ICD-10-CM

## 2019-06-19 DIAGNOSIS — M79641 Pain in right hand: Secondary | ICD-10-CM

## 2019-06-19 DIAGNOSIS — E1165 Type 2 diabetes mellitus with hyperglycemia: Secondary | ICD-10-CM

## 2019-06-19 DIAGNOSIS — E663 Overweight: Secondary | ICD-10-CM

## 2019-06-19 NOTE — Patient Instructions (Signed)
Use Voltaren gel - apply to affected area twice    Hand Pain Many things can cause hand pain. Some common causes are:  An injury.  Repeating the same movement with your hand over and over (overuse).  Osteoporosis.  Arthritis.  Lumps in the tendons or joints of the hand and wrist (ganglion cysts).  Nerve compression syndromes (carpal tunnel syndrome).  Inflammation of the tendons (tendinitis).  Infection. Follow these instructions at home: Pay attention to any changes in your symptoms. Take these actions to help with your discomfort: Managing pain, stiffness, and swelling   Take over-the-counter and prescription medicines only as told by your health care provider.  Wear a hand splint or support as told by your health care provider.  If directed, put ice on the affected area: ? Put ice in a plastic bag. ? Place a towel between your skin and the bag. ? Leave the ice on for 20 minutes, 2-3 times a day. Activity  Take breaks from repetitive activity often.  Avoid activities that make your pain worse.  Minimize stress on your hands and wrists as much as possible.  Do stretches or exercises as told by your health care provider.  Do not do activities that make your pain worse. Contact a health care provider if:  Your pain does not get better after a few days of self-care.  Your pain gets worse.  Your pain affects your ability to do your daily activities. Get help right away if:  Your hand becomes warm, red, or swollen.  Your hand is numb or tingling.  Your hand is extremely swollen or deformed.  Your hand or fingers turn white or blue.  You cannot move your hand, wrist, or fingers. Summary  Many things can cause hand pain.  Contact your health care provider if your pain does not get better after a few days of self care.  Minimize stress on your hands and wrists as much as possible.  Do not do activities that make your pain worse. This information is not  intended to replace advice given to you by your health care provider. Make sure you discuss any questions you have with your health care provider. Document Revised: 12/16/2017 Document Reviewed: 12/16/2017 Elsevier Patient Education  [employer].

## 2019-06-20 NOTE — Progress Notes (Signed)
This visit occurred during the SARS-CoV-2 public health emergency.  Safety protocols were in place, including screening questions prior to the visit, additional usage of staff PPE, and extensive cleaning of exam room while observing appropriate contact time as indicated for disinfecting solutions.  Subjective:     Patient ID: Virginia Russell , female    DOB: [birth year] , 74 y.o.   MRN: [id number]   Chief Complaint  Patient presents with  . Diabetes    (R) hand swelling   . Hypertension    HPI  She presents today for DM/HTN check. Reports compliance with meds.  Also c/o right hand pain today. Denies fall/trauma. Hurts to make fist. Has stiffness upon awakening.   Diabetes She presents for her follow-up diabetic visit. She has type 2 diabetes mellitus. Her disease course has been stable. There are no hypoglycemic associated symptoms. Pertinent negatives for diabetes include no blurred vision and no chest pain. There are no hypoglycemic complications. Risk factors for coronary artery disease include dyslipidemia, diabetes mellitus, hypertension, post-menopausal and stress. She is compliant with treatment most of the time. She is following a diabetic diet. She participates in exercise intermittently. Her breakfast blood glucose is taken between 7-8 am. Her breakfast blood glucose range is generally 90-110 mg/dl. An ACE inhibitor/angiotensin II receptor blocker is being taken. Eye exam is current.  Hypertension This is a chronic problem. The current episode started more than 1 year ago. The problem has been gradually improving since onset. The problem is controlled. Pertinent negatives include no blurred vision, chest pain, palpitations or shortness of breath. Past treatments include ACE inhibitors and diuretics. The current treatment provides moderate improvement.     Past Medical History:  Diagnosis Date  . Anxiety   . Diabetes mellitus without complication ([place])   . High cholesterol    . HTN (hypertension)      Family History  Problem Relation Age of Onset  . Breast cancer Mother   . Heart disease Mother   . Stroke Father   . Heart attack Father   . Heart attack Daughter      Current Outpatient Medications:  .  Accu-Chek Softclix Lancets lancets, Use as instructed to check blood sugars 1 time per day dx: e11.22, Disp: 150 each, Rfl: 3 .  Alcohol Swabs (B-D SINGLE USE SWABS BUTTERFLY) PADS, 100 Pieces by Does not apply route as needed., Disp: 100 each, Rfl: 3 .  Alogliptin Benzoate (NESINA) 25 MG TABS, Take 1 tablet by mouth daily., Disp: 90 tablet, Rfl: 1 .  aspirin 81 MG tablet, Take 81 mg by mouth daily., Disp: , Rfl:  .  atorvastatin (LIPITOR) 40 MG tablet, TAKE 1 TABLET AT NIGHT, Disp: 90 tablet, Rfl: 1 .  Blood Glucose Monitoring Suppl (ACCU-CHEK AVIVA PLUS) w/Device KIT, Use as directed to check blood sugars 1 time per day dx: e11.22, Disp: 1 kit, Rfl: 1 .  celecoxib (CELEBREX) 200 MG capsule, Take 1 capsule (200 mg total) by mouth 2 (two) times daily., Disp: 180 capsule, Rfl: 1 .  glucose blood (ACCU-CHEK AVIVA PLUS) test strip, Use as instructed to check blood sugars 1 time per day dx: e11.22, Disp: 150 each, Rfl: 3 .  losartan (COZAAR) 50 MG tablet, TAKE 1 TABLET EVERY DAY, Disp: 90 tablet, Rfl: 1 .  metFORMIN (GLUCOPHAGE-XR) 750 MG 24 hr tablet, TAKE 2 TABLETS EVERY EVENING, Disp: 180 tablet, Rfl: 1 .  pioglitazone (ACTOS) 30 MG tablet, TAKE 1 TABLET EVERY DAY, Disp: 90 tablet, Rfl: 1 .  venlafaxine (EFFEXOR) 37.5 MG tablet, Take 1 tablet (37.5 mg total) by mouth daily., Disp: 90 tablet, Rfl: 1   Allergies  Allergen Reactions  . Codeine      Review of Systems  Constitutional: Negative.   Eyes: Negative for blurred vision.  Respiratory: Negative.  Negative for shortness of breath.   Cardiovascular: Negative.  Negative for chest pain and palpitations.  Gastrointestinal: Negative.   Musculoskeletal: Positive for arthralgias.  Neurological: Negative.    Psychiatric/Behavioral: Negative.      Today's Vitals   06/19/19 1140  BP: 132/78  Pulse: 79  Temp: 97.9 F (36.6 C)  TempSrc: Oral  SpO2: 96%  Weight: 153 lb (69.4 kg)  Height: 5' 1.2" (1.554 m)   Body mass index is 28.72 kg/m.   Objective:  Physical Exam Vitals and nursing note reviewed.  Constitutional:      Appearance: Normal appearance.  HENT:     Head: Normocephalic and atraumatic.  Cardiovascular:     Rate and Rhythm: Normal rate and regular rhythm.     Heart sounds: Normal heart sounds.  Pulmonary:     Effort: Pulmonary effort is normal.     Breath sounds: Normal breath sounds.  Musculoskeletal:     Comments: There is swelling of right MCP joints and PIP joints. No overlying erythema.   Skin:    General: Skin is warm.  Neurological:     General: No focal deficit present.     Mental Status: She is alert.  Psychiatric:        Mood and Affect: Mood normal.        Behavior: Behavior normal.         Assessment And Plan:     1. Uncontrolled type 2 diabetes mellitus with hyperglycemia (HCC)  Chronic, I will check labs as listed below. She is encouraged to exercise at least 150 minutes of exercise per week. She was also encouraged to schedule appt with ophthalmologist for diabetic eye exam.   - Hemoglobin A1c - BMP8+EGFR  2. Essential hypertension, benign  Chronic, fair control. She is aware that optimal BP goal is less than 130/80. She is encouraged to avoid adding salt to her foods and to stay active.   - TSH  3. Right hand pain  Squeeze test positive. I will check an arthritis panel. Advised she can apply topical voltaren gel to affected area twice daily as needed.   - ANA, IFA (with reflex) - CYCLIC CITRUL PEPTIDE ANTIBODY, IGG/IGA - Rheumatoid factor - Sedimentation rate - Uric acid  4. Overweight (BMI 25.0-29.9)  BMI 28. Her weight is within acceptable range for her demographic.   Maximino Greenland, MD    THE PATIENT IS ENCOURAGED TO  PRACTICE SOCIAL DISTANCING DUE TO THE COVID-19 PANDEMIC.

## 2019-06-21 LAB — URIC ACID: Uric Acid: 4.2 mg/dL (ref 3.1–7.9)

## 2019-06-21 LAB — BMP8+EGFR
BUN/Creatinine Ratio: 23 (ref 12–28)
BUN: 14 mg/dL (ref 8–27)
CO2: 26 mmol/L (ref 20–29)
Calcium: 9.5 mg/dL (ref 8.7–10.3)
Chloride: 102 mmol/L (ref 96–106)
Creatinine, Ser: 0.6 mg/dL (ref 0.57–1.00)
GFR calc Af Amer: 104 mL/min/{1.73_m2} (ref >59)
GFR calc non Af Amer: 90 mL/min/{1.73_m2} (ref >59)
Glucose: 115 mg/dL — ABNORMAL HIGH (ref 65–99)
Potassium: 4.5 mmol/L (ref 3.5–5.2)
Sodium: 140 mmol/L (ref 134–144)

## 2019-06-21 LAB — SEDIMENTATION RATE: Sed Rate: 2 mm/hr (ref 0–40)

## 2019-06-21 LAB — ANTINUCLEAR ANTIBODIES, IFA: ANA Titer 1: NEGATIVE

## 2019-06-21 LAB — RHEUMATOID FACTOR: Rheumatoid fact SerPl-aCnc: <10 IU/mL (ref 0.0–13.9)

## 2019-06-21 LAB — CYCLIC CITRUL PEPTIDE ANTIBODY, IGG/IGA: Cyclic Citrullin Peptide Ab: 8 units (ref 0–19)

## 2019-06-21 LAB — HEMOGLOBIN A1C
Est. average glucose Bld gHb Est-mCnc: 166 mg/dL
Hgb A1c MFr Bld: 7.4 % — ABNORMAL HIGH (ref 4.8–5.6)

## 2019-06-21 LAB — TSH: TSH: 1.87 u[IU]/mL (ref 0.450–4.500)

## 2019-06-22 ENCOUNTER — Other Ambulatory Visit: Payer: Self-pay | Admitting: Internal Medicine

## 2019-06-25 ENCOUNTER — Telehealth: Payer: Self-pay

## 2019-06-25 NOTE — Telephone Encounter (Signed)
-----   Message from Glendale Chard, MD sent at 06/23/2019  8:52 PM EDT ----- Here are your lab results:  Your hba1c is 7,4, this is stable. Be sure to avoid sugary beverages and exercise a tolerated.   Your kidney function is normal. Your autoimmune arthritis panel is negative. Have you tried the Voltaren gel on your hand? Was it helpful? Be sure to use twice daily as needed.   Lastly, your thyroid function is normal.   Please let me know if you have any questions or concerns. Stay safe!   Sincerely,    Robyn N. Baird Cancer, MD

## 2019-06-26 ENCOUNTER — Telehealth: Payer: Self-pay

## 2019-06-26 NOTE — Telephone Encounter (Signed)
-----   Message from Glendale Chard, MD sent at 06/23/2019  8:52 PM EDT ----- Here are your lab results:  Your hba1c is 7,4, this is stable. Be sure to avoid sugary beverages and exercise a tolerated.   Your kidney function is normal. Your autoimmune arthritis panel is negative. Have you tried the Voltaren gel on your hand? Was it helpful? Be sure to use twice daily as needed.   Lastly, your thyroid function is normal.   Please let me know if you have any questions or concerns. Stay safe!   Sincerely,    Robyn N. Baird Cancer, MD

## 2019-06-26 NOTE — Telephone Encounter (Signed)
Left vm for pt to call for lab results or look at [facility]

## 2019-08-02 ENCOUNTER — Telehealth: Payer: Self-pay | Admitting: Internal Medicine

## 2019-08-02 NOTE — Chronic Care Management (AMB) (Signed)
  Chronic Care Management   Outreach Note  08/02/2019 Name: Virginia Russell [facility] MRN: [id number] DOB: [birth year]  Virginia Russell is a 74 y.o. year old female who is a primary care patient of Glendale Chard, MD. I reached out to [employer] by phone today in response to a referral sent by Virginia Russell's health plan.     An unsuccessful telephone outreach was attempted today. The patient was referred to the case management team for assistance with care management and care coordination.   Follow Up Plan: A HIPPA compliant phone message was left for the patient providing contact information and requesting a return call. The care management team will reach out to the patient again over the next 7 days. If patient returns call to provider office, please advise to call [place] at [phone].  [place], [place] [address] Direct Dial: [phone] [email or web address]: [email or web address]

## 2019-08-08 NOTE — Chronic Care Management (AMB) (Signed)
  Chronic Care Management   Outreach Note  08/08/2019 Name: Virginia Russell [facility] MRN: [id number] DOB: November 28, 1945  Virginia Russell is a 74 y.o. year old female who is a primary care patient of Glendale Chard, MD. I reached out to Virginia Russell by phone today in response to a referral sent by Virginia Russell's health plan.     A second unsuccessful telephone outreach was attempted today. The patient was referred to the case management team for assistance with care management and care coordination.   Follow Up Plan: A HIPPA compliant phone message was left for the patient providing contact information and requesting a return call. The care management team will reach out to the patient again over the next 7 days. If patient returns call to provider office, please advise to call [place] at [phone].  [place],  [address] Direct Dial: [phone] [email or web address] Website: [email or web address]

## 2019-08-13 NOTE — Chronic Care Management (AMB) (Signed)
  Chronic Care Management   Outreach Note  08/13/2019 Name: [name] [facility] MRN: [id number] DOB: [birth year]  Virginia Russell is a 74 y.o. year old female who is a primary care patient of Glendale Chard, MD. I reached out to [employer] by phone today in response to a referral sent by Ms. Clover Mealy Aiken's health plan.     Third unsuccessful telephone outreach was attempted today. The patient was referred to the case management team for assistance with care management and care coordination. The patient's primary care provider has been notified of our unsuccessful attempts to make or maintain contact with the patient. The care management team is pleased to engage with this patient at any time in the future should he/she be interested in assistance from the care management team.   Follow Up Plan: A HIPPA compliant phone message was left for the patient providing contact information and requesting a return call. The care management team is available to follow up with the patient after provider conversation with the patient regarding recommendation for care management engagement and subsequent re-referral to the care management team.  If patient returns call to provider office, please advise to call [place] at [phone].  [place], [place] [address] Direct Dial: [phone] [email or web address] Website: [email or web address]

## 2019-08-15 ENCOUNTER — Other Ambulatory Visit: Payer: Self-pay | Admitting: Internal Medicine

## 2019-09-25 DIAGNOSIS — H0102B Squamous blepharitis left eye, upper and lower eyelids: Secondary | ICD-10-CM | POA: Diagnosis not present

## 2019-09-25 DIAGNOSIS — H04123 Dry eye syndrome of bilateral lacrimal glands: Secondary | ICD-10-CM | POA: Diagnosis not present

## 2019-09-25 DIAGNOSIS — H10413 Chronic giant papillary conjunctivitis, bilateral: Secondary | ICD-10-CM | POA: Diagnosis not present

## 2019-09-25 DIAGNOSIS — H0102A Squamous blepharitis right eye, upper and lower eyelids: Secondary | ICD-10-CM | POA: Diagnosis not present

## 2019-10-09 DIAGNOSIS — H10413 Chronic giant papillary conjunctivitis, bilateral: Secondary | ICD-10-CM | POA: Diagnosis not present

## 2019-10-09 DIAGNOSIS — H25813 Combined forms of age-related cataract, bilateral: Secondary | ICD-10-CM | POA: Diagnosis not present

## 2019-10-09 DIAGNOSIS — H40013 Open angle with borderline findings, low risk, bilateral: Secondary | ICD-10-CM | POA: Diagnosis not present

## 2019-10-09 DIAGNOSIS — H04123 Dry eye syndrome of bilateral lacrimal glands: Secondary | ICD-10-CM | POA: Diagnosis not present

## 2019-10-09 DIAGNOSIS — H0102A Squamous blepharitis right eye, upper and lower eyelids: Secondary | ICD-10-CM | POA: Diagnosis not present

## 2019-10-09 DIAGNOSIS — E119 Type 2 diabetes mellitus without complications: Secondary | ICD-10-CM | POA: Diagnosis not present

## 2019-10-09 DIAGNOSIS — H0102B Squamous blepharitis left eye, upper and lower eyelids: Secondary | ICD-10-CM | POA: Diagnosis not present

## 2019-10-09 LAB — HM DIABETES EYE EXAM

## 2019-10-10 ENCOUNTER — Encounter: Payer: Self-pay | Admitting: Internal Medicine

## 2019-10-23 ENCOUNTER — Ambulatory Visit (INDEPENDENT_AMBULATORY_CARE_PROVIDER_SITE_OTHER): Payer: Medicare HMO | Admitting: Internal Medicine

## 2019-10-23 ENCOUNTER — Encounter: Payer: Self-pay | Admitting: Internal Medicine

## 2019-10-23 ENCOUNTER — Other Ambulatory Visit: Payer: Self-pay

## 2019-10-23 VITALS — BP 124/80 | HR 73 | Temp 97.9°F | Ht 61.2 in | Wt 153.0 lb

## 2019-10-23 DIAGNOSIS — I10 Essential (primary) hypertension: Secondary | ICD-10-CM | POA: Diagnosis not present

## 2019-10-23 DIAGNOSIS — Z6828 Body mass index (BMI) 28.0-28.9, adult: Secondary | ICD-10-CM | POA: Diagnosis not present

## 2019-10-23 DIAGNOSIS — E663 Overweight: Secondary | ICD-10-CM | POA: Diagnosis not present

## 2019-10-23 DIAGNOSIS — E1165 Type 2 diabetes mellitus with hyperglycemia: Secondary | ICD-10-CM | POA: Diagnosis not present

## 2019-10-23 DIAGNOSIS — E7800 Pure hypercholesterolemia, unspecified: Secondary | ICD-10-CM | POA: Diagnosis not present

## 2019-10-23 DIAGNOSIS — R011 Cardiac murmur, unspecified: Secondary | ICD-10-CM | POA: Diagnosis not present

## 2019-10-23 DIAGNOSIS — F3341 Major depressive disorder, recurrent, in partial remission: Secondary | ICD-10-CM

## 2019-10-23 LAB — CMP14+EGFR
ALT: 8 IU/L (ref 0–32)
AST: 11 IU/L (ref 0–40)
Albumin/Globulin Ratio: 2 (ref 1.2–2.2)
Albumin: 4.4 g/dL (ref 3.7–4.7)
Alkaline Phosphatase: 64 IU/L (ref 48–121)
BUN/Creatinine Ratio: 26 (ref 12–28)
BUN: 16 mg/dL (ref 8–27)
Bilirubin Total: 0.8 mg/dL (ref 0.0–1.2)
CO2: 25 mmol/L (ref 20–29)
Calcium: 9.7 mg/dL (ref 8.7–10.3)
Chloride: 103 mmol/L (ref 96–106)
Creatinine, Ser: 0.62 mg/dL (ref 0.57–1.00)
GFR calc Af Amer: 103 mL/min/{1.73_m2} (ref >59)
GFR calc non Af Amer: 89 mL/min/{1.73_m2} (ref >59)
Globulin, Total: 2.2 g/dL (ref 1.5–4.5)
Glucose: 146 mg/dL — ABNORMAL HIGH (ref 65–99)
Potassium: 4.8 mmol/L (ref 3.5–5.2)
Sodium: 141 mmol/L (ref 134–144)
Total Protein: 6.6 g/dL (ref 6.0–8.5)

## 2019-10-23 LAB — LIPID PANEL
Chol/HDL Ratio: 2.5 ratio (ref 0.0–4.4)
Cholesterol, Total: 163 mg/dL (ref 100–199)
HDL: 66 mg/dL (ref >39)
LDL Chol Calc (NIH): 81 mg/dL (ref 0–99)
Triglycerides: 88 mg/dL (ref 0–149)
VLDL Cholesterol Cal: 16 mg/dL (ref 5–40)

## 2019-10-23 LAB — HEMOGLOBIN A1C
Est. average glucose Bld gHb Est-mCnc: 169 mg/dL
Hgb A1c MFr Bld: 7.5 % — ABNORMAL HIGH (ref 4.8–5.6)

## 2019-10-23 MED ORDER — CELECOXIB 200 MG PO CAPS: 200.0000 mg | ORAL_CAPSULE | Freq: Two times a day (BID) | ORAL | 2 refills | Status: DC

## 2019-10-23 NOTE — Patient Instructions (Signed)

## 2019-10-23 NOTE — Progress Notes (Signed)
I,[name],acting as a Education administrator for Maximino Greenland, MD.,have documented all relevant documentation on the behalf of Maximino Greenland, MD,as directed by  Maximino Greenland, MD while in the presence of Maximino Greenland, MD.  This visit occurred during the SARS-CoV-2 public health emergency.  Safety protocols were in place, including screening questions prior to the visit, additional usage of staff PPE, and extensive cleaning of exam room while observing appropriate contact time as indicated for disinfecting solutions.  Subjective:     Patient ID: Virginia Russell , female    DOB: 07-28-1945 , 74 y.o.   MRN: [id number]   Chief Complaint  Patient presents with  . Diabetes  . Hypertension    HPI  The patient is here today for a follow-up on her diabetes.  She reports compliance with meds.   Diabetes She presents for her follow-up diabetic visit. She has type 2 diabetes mellitus. No MedicAlert identification noted. Her disease course has been stable. There are no hypoglycemic associated symptoms. Pertinent negatives for diabetes include no blurred vision and no chest pain. There are no hypoglycemic complications. Symptoms are stable. Risk factors for coronary artery disease include dyslipidemia, diabetes mellitus, hypertension, post-menopausal and stress. She is compliant with treatment all of the time. She is following a diabetic diet. She participates in exercise intermittently. Her breakfast blood glucose is taken between 7-8 am. Her breakfast blood glucose range is generally 90-110 mg/dl. An ACE inhibitor/angiotensin II receptor blocker is being taken. Eye exam is current.  Hypertension This is a chronic problem. The current episode started more than 1 year ago. The problem has been gradually improving since onset. The problem is controlled. Pertinent negatives include no blurred vision, chest pain, palpitations or shortness of breath. Past treatments include ACE inhibitors and diuretics.  The current treatment provides moderate improvement.     Past Medical History:  Diagnosis Date  . Anxiety   . Diabetes mellitus without complication ([place])   . High cholesterol   . HTN (hypertension)      Family History  Problem Relation Age of Onset  . Breast cancer Mother   . Heart disease Mother   . Stroke Father   . Heart attack Father   . Heart attack Daughter      Current Outpatient Medications:  .  Accu-Chek Softclix Lancets lancets, Use as instructed to check blood sugars 1 time per day dx: e11.22, Disp: 150 each, Rfl: 3 .  Alcohol Swabs (B-D SINGLE USE SWABS BUTTERFLY) PADS, 100 Pieces by Does not apply route as needed., Disp: 100 each, Rfl: 3 .  Alogliptin Benzoate (NESINA) 25 MG TABS, Take 1 tablet by mouth daily., Disp: 90 tablet, Rfl: 1 .  aspirin 81 MG tablet, Take 81 mg by mouth daily., Disp: , Rfl:  .  atorvastatin (LIPITOR) 40 MG tablet, TAKE 1 TABLET AT NIGHT, Disp: 90 tablet, Rfl: 1 .  Blood Glucose Monitoring Suppl (ACCU-CHEK AVIVA PLUS) w/Device KIT, Use as directed to check blood sugars 1 time per day dx: e11.22, Disp: 1 kit, Rfl: 1 .  celecoxib (CELEBREX) 200 MG capsule, Take 1 capsule (200 mg total) by mouth 2 (two) times daily., Disp: 90 capsule, Rfl: 2 .  glucose blood (ACCU-CHEK AVIVA PLUS) test strip, Use as instructed to check blood sugars 1 time per day dx: e11.22, Disp: 150 each, Rfl: 3 .  losartan (COZAAR) 50 MG tablet, TAKE 1 TABLET EVERY DAY, Disp: 90 tablet, Rfl: 1 .  metFORMIN (GLUCOPHAGE-XR) 750 MG 24  hr tablet, TAKE 2 TABLETS EVERY EVENING, Disp: 180 tablet, Rfl: 1 .  pioglitazone (ACTOS) 30 MG tablet, TAKE 1 TABLET EVERY DAY, Disp: 90 tablet, Rfl: 1 .  venlafaxine (EFFEXOR) 37.5 MG tablet, TAKE 1 TABLET EVERY DAY, Disp: 90 tablet, Rfl: 1   Allergies  Allergen Reactions  . Codeine      Review of Systems  Constitutional: Negative.   Eyes: Negative for blurred vision.  Respiratory: Negative.  Negative for shortness of breath.    Cardiovascular: Negative for chest pain and palpitations.  Gastrointestinal: Negative.   Neurological: Negative.   Psychiatric/Behavioral: Negative.   All other systems reviewed and are negative.    Today's Vitals   10/23/19 0834  BP: 124/80  Pulse: 73  Temp: 97.9 F (36.6 C)  TempSrc: Oral  Weight: 153 lb (69.4 kg)  Height: 5' 1.2" (1.554 m)   Body mass index is 28.72 kg/m.  Wt Readings from Last 3 Encounters:  10/23/19 153 lb (69.4 kg)  06/19/19 153 lb (69.4 kg)  03/07/19 149 lb 3.2 oz (67.7 kg)   Objective:  Physical Exam Vitals and nursing note reviewed.  Constitutional:      Appearance: Normal appearance. She is obese.  HENT:     Head: Normocephalic and atraumatic.  Cardiovascular:     Rate and Rhythm: Normal rate and regular rhythm.     Heart sounds: Normal heart sounds.  Pulmonary:     Breath sounds: Normal breath sounds.  Skin:    General: Skin is warm.  Neurological:     General: No focal deficit present.     Mental Status: She is alert and oriented to person, place, and time.         Assessment And Plan:     1. Uncontrolled type 2 diabetes mellitus with hyperglycemia (HCC)  Comments: Chronic, meds reviewed. Discussed consideration of switching meds to GLP-1/SGLT2 inhibitor. She is hesitant, I do plan to wean her off of Actos in the near future.  She will rto in Dec 2021 for her next AWV/physical examination.   - Hemoglobin A1c - CMP14+EGFR - Lipid panel  2. Essential hypertension, benign  Comments: Chronic, well controlled. She will continue with current meds. She is encouraged to avoid adding salt to her foods.   3. Heart murmur  Comments: I will refer her for 2d-echo.   4. Recurrent major depressive disorder, in partial remission (HCC)  Comments: Chronic, feels sx are controlled. She will continue with current meds.   5. Pure hypercholesterolemia  Comments: Chronic, I will check fasting lipid panel and LFTs today. Encouraged to avoid  fried foods and to increase daily activity as tolerated.   - Lipid panel  6. Overweight with body mass index (BMI) of 28 to 28.9 in adult  Comments: Her weight is stable for her demographic. Encouraged to aim for 150 minutes of exercise weekly. The patient was encouraged to exercise at least 150 minutes per week.   Patient was given opportunity to ask questions. Patient verbalized understanding of the plan and was able to repeat key elements of the plan. All questions were answered to their satisfaction.  Maximino Greenland, MD   I, Maximino Greenland, MD, have reviewed all documentation for this visit. The documentation on 10/23/19 for the exam, diagnosis, procedures, and orders are all accurate and complete.  THE PATIENT IS ENCOURAGED TO PRACTICE SOCIAL DISTANCING DUE TO THE COVID-19 PANDEMIC.

## 2019-10-25 ENCOUNTER — Other Ambulatory Visit: Payer: Self-pay | Admitting: Internal Medicine

## 2019-11-07 ENCOUNTER — Other Ambulatory Visit: Payer: Self-pay | Admitting: Internal Medicine

## 2019-11-19 ENCOUNTER — Other Ambulatory Visit: Payer: Self-pay

## 2019-11-19 DIAGNOSIS — R011 Cardiac murmur, unspecified: Secondary | ICD-10-CM

## 2019-12-03 ENCOUNTER — Other Ambulatory Visit: Payer: Self-pay

## 2019-12-03 ENCOUNTER — Ambulatory Visit: Payer: Medicare HMO

## 2019-12-03 DIAGNOSIS — R011 Cardiac murmur, unspecified: Secondary | ICD-10-CM | POA: Diagnosis not present

## 2019-12-04 ENCOUNTER — Telehealth: Payer: Self-pay

## 2019-12-04 NOTE — Telephone Encounter (Signed)
I left the pt a message to call back for her echo results.

## 2019-12-13 ENCOUNTER — Telehealth: Payer: Self-pay

## 2019-12-13 NOTE — Telephone Encounter (Signed)
I left the pt a message to call back for her echo results.

## 2019-12-13 NOTE — Telephone Encounter (Signed)
-----   Message from Glendale Chard, MD sent at 12/04/2019  3:56 PM EDT ----- Echo results: you have normal heart function. There is calcification of the aortic valve. If not already followed by Cardiology, would like to schedule you for f/u. We need to follow the valvular calcifications. Are you okay with this?

## 2019-12-14 ENCOUNTER — Telehealth: Payer: Self-pay

## 2019-12-14 NOTE — Telephone Encounter (Signed)
-----   Message from Glendale Chard, MD sent at 12/04/2019  3:56 PM EDT ----- Echo results: you have normal heart function. There is calcification of the aortic valve. If not already followed by Cardiology, would like to schedule you for f/u. We need to follow the valvular calcifications. Are you okay with this?

## 2020-01-01 ENCOUNTER — Ambulatory Visit (INDEPENDENT_AMBULATORY_CARE_PROVIDER_SITE_OTHER): Payer: Medicare HMO | Admitting: Internal Medicine

## 2020-01-01 ENCOUNTER — Other Ambulatory Visit: Payer: Self-pay

## 2020-01-01 ENCOUNTER — Encounter: Payer: Self-pay | Admitting: Internal Medicine

## 2020-01-01 VITALS — BP 120/62 | HR 75 | Temp 97.9°F | Ht 61.0 in | Wt 156.0 lb

## 2020-01-01 DIAGNOSIS — R0600 Dyspnea, unspecified: Secondary | ICD-10-CM

## 2020-01-01 DIAGNOSIS — Z8249 Family history of ischemic heart disease and other diseases of the circulatory system: Secondary | ICD-10-CM | POA: Diagnosis not present

## 2020-01-01 DIAGNOSIS — I350 Nonrheumatic aortic (valve) stenosis: Secondary | ICD-10-CM

## 2020-01-01 DIAGNOSIS — E663 Overweight: Secondary | ICD-10-CM | POA: Diagnosis not present

## 2020-01-01 DIAGNOSIS — R0609 Other forms of dyspnea: Secondary | ICD-10-CM

## 2020-01-01 NOTE — Progress Notes (Signed)
I,Tianna Badgett,acting as a Education administrator for Maximino Greenland, MD.,have documented all relevant documentation on the behalf of Maximino Greenland, MD,as directed by  Maximino Greenland, MD while in the presence of Maximino Greenland, MD.  This visit occurred during the SARS-CoV-2 public health emergency.  Safety protocols were in place, including screening questions prior to the visit, additional usage of staff PPE, and extensive cleaning of exam room while observing appropriate contact time as indicated for disinfecting solutions.  Subjective:     Patient ID: Virginia Russell , female    DOB: 07-20-1945 , 74 y.o.   MRN: [id number]   Chief Complaint  Patient presents with  . Consult    talk about echo    HPI  Patient is here today for a consultation about her ECHO results. She did receive her results verbally, but wanted to come in to discuss further. She denies chest pain, but does report having shortness of breath with exertion. She cleans houses for a living.     Past Medical History:  Diagnosis Date  . Anxiety   . Diabetes mellitus without complication ([place])   . High cholesterol   . HTN (hypertension)      Family History  Problem Relation Age of Onset  . Breast cancer Mother   . Heart disease Mother   . Stroke Father   . Heart attack Father   . Heart attack Daughter      Current Outpatient Medications:  .  Accu-Chek Softclix Lancets lancets, Use as instructed to check blood sugars 1 time per day dx: e11.22, Disp: 150 each, Rfl: 3 .  Alcohol Swabs (B-D SINGLE USE SWABS BUTTERFLY) PADS, 100 Pieces by Does not apply route as needed., Disp: 100 each, Rfl: 3 .  Alogliptin Benzoate (NESINA) 25 MG TABS, Take 1 tablet by mouth daily., Disp: 90 tablet, Rfl: 1 .  aspirin 81 MG tablet, Take 81 mg by mouth daily., Disp: , Rfl:  .  atorvastatin (LIPITOR) 40 MG tablet, TAKE 1 TABLET EVERY NIGHT, Disp: 90 tablet, Rfl: 1 .  Blood Glucose Monitoring Suppl (ACCU-CHEK AVIVA PLUS) w/Device KIT, Use  as directed to check blood sugars 1 time per day dx: e11.22, Disp: 1 kit, Rfl: 1 .  celecoxib (CELEBREX) 200 MG capsule, Take 1 capsule (200 mg total) by mouth 2 (two) times daily., Disp: 90 capsule, Rfl: 2 .  glucose blood (ACCU-CHEK AVIVA PLUS) test strip, Use as instructed to check blood sugars 1 time per day dx: e11.22, Disp: 150 each, Rfl: 3 .  losartan (COZAAR) 50 MG tablet, TAKE 1 TABLET EVERY DAY, Disp: 90 tablet, Rfl: 1 .  metFORMIN (GLUCOPHAGE-XR) 750 MG 24 hr tablet, TAKE 2 TABLETS EVERY EVENING, Disp: 180 tablet, Rfl: 1 .  pioglitazone (ACTOS) 30 MG tablet, TAKE 1 TABLET EVERY DAY, Disp: 90 tablet, Rfl: 1 .  venlafaxine (EFFEXOR) 37.5 MG tablet, TAKE 1 TABLET EVERY DAY, Disp: 90 tablet, Rfl: 1   Allergies  Allergen Reactions  . Codeine      Review of Systems  Constitutional: Negative.   Respiratory: Negative.   Cardiovascular: Negative.   Gastrointestinal: Negative.   Neurological: Negative.      Today's Vitals   01/01/20 0931  BP: 120/62  Pulse: 75  Temp: 97.9 F (36.6 C)  TempSrc: Oral  Weight: 156 lb (70.8 kg)  Height: 5' 1" (1.549 m)   Body mass index is 29.48 kg/m.   Wt Readings from Last 3 Encounters:  01/01/20 156 lb (70.8  kg)  10/23/19 153 lb (69.4 kg)  06/19/19 153 lb (69.4 kg)    Objective:  Physical Exam Vitals and nursing note reviewed.  Constitutional:      Appearance: Normal appearance.  HENT:     Head: Normocephalic and atraumatic.  Cardiovascular:     Rate and Rhythm: Normal rate and regular rhythm.     Heart sounds: Murmur heard.   Pulmonary:     Effort: Pulmonary effort is normal.     Breath sounds: Normal breath sounds.  Skin:    General: Skin is warm.  Neurological:     General: No focal deficit present.     Mental Status: She is alert.  Psychiatric:        Mood and Affect: Mood normal.        Behavior: Behavior normal.         Assessment And Plan:     1. Aortic stenosis, mild Comments: Echocardiogram results  reviewed with patient in full detail. I spent more than 20 minutes of face to face time with the patient. All questions were answered to her satisfaction. She agrees to Cardiology referral for further evaluation.  - Ambulatory referral to Cardiology  2. Dyspnea on exertion Comments: Possibly due to the above diagnosis. I will also check CBC today to r/o anemia.  - Ambulatory referral to Cardiology  3. Overweight (BMI 25.0-29.9) Comments: Her BMI is 29, acceptable for her demographic. Encouraged to gradually increase her daily activity level as tolerated.   4. Family history of coronary artery disease Comments: She is agreeable to Cardiology evaluation. Encouraged to comply with statin therapy, dietary recommendations and increase activity as tolerated.    Patient was given opportunity to ask questions. Patient verbalized understanding of the plan and was able to repeat key elements of the plan. All questions were answered to their satisfaction.  Maximino Greenland, MD   I, Maximino Greenland, MD, have reviewed all documentation for this visit. The documentation on 01/07/20 for the exam, diagnosis, procedures, and orders are all accurate and complete.  THE PATIENT IS ENCOURAGED TO PRACTICE SOCIAL DISTANCING DUE TO THE COVID-19 PANDEMIC.

## 2020-01-01 NOTE — Patient Instructions (Addendum)
Heart Murmur A heart murmur is an extra sound that is caused by chaotic blood flow through the valves of the heart. The murmur can be heard as a "hum" or "whoosh" sound when blood flows through the heart. There are two types of heart murmurs:  Innocent (benign) murmurs. Most people with this type of heart murmur do not have a heart problem. Many children have innocent heart murmurs. Your health care provider may suggest some basic tests to find out whether your murmur is an innocent murmur. If an innocent heart murmur is found, there is no need for further tests or treatment and no need to restrict activities or stop playing sports.  Abnormal murmurs. These types of murmurs can occur in children and adults. Abnormal murmurs may be a sign of a more serious heart condition, such as a heart defect present at birth (congenital defect) or heart valve disease. What are the causes?  The heart has four areas called chambers. Valves separate the upper and lower chambers from each other (tricuspid valve and mitral valve) and separate the lower chambers of the heart from pathways that lead away from the heart (aortic valve and pulmonary valve). Normally, the valves open to let blood flow through or out of your heart, and then they shut to keep the blood from flowing backward. This condition is caused by heart valves that are not working properly.  In children, abnormal heart murmurs are typically caused by congenital defects.  In adults, abnormal murmurs are usually caused by heart valve problems from disease, infection, or aging. This condition may also be caused by:  Pregnancy.  Fever.  Overactive thyroid gland.  Anemia.  Exercise.  Rapid growth spurts (in children). What are the signs or symptoms? Innocent murmurs do not cause symptoms, and many people with abnormal murmurs may not have symptoms. If symptoms do develop, they may include:  Shortness of breath.  Blue coloring of the skin,  especially on the fingertips.  Chest pain.  Palpitations, or feeling a fluttering or skipped heartbeat.  Fainting.  Persistent cough.  Getting tired much faster than expected.  Swelling in the abdomen, feet, or ankles. How is this diagnosed? This condition may be diagnosed during a routine physical or other exam. If your health care provider hears a murmur with a stethoscope, he or she will listen for:  Where the murmur is located in your heart.  How long the murmur lasts (duration).  When the murmur is heard during the heartbeat.  How loud the murmur is. This may help the health care provider figure out what is causing the murmur. You may be referred to a heart specialist (cardiologist). You may also have other tests, including:  Electrocardiogram (ECG or EKG). This test measures the electrical activity of your heart.  Echocardiogram. This test uses high frequency sound waves to make pictures of your heart.  MRI or chest X-ray.  Cardiac catheterization. This test looks at blood flow through the arteries around the heart. For children and adults who have an abnormal heart murmur and want to stay active, it is important to:  Complete testing.  Review test results.  Receive recommendations from your health care provider. If heart disease is present, it may not be safe to play or be active. How is this treated? Heart murmurs themselves do not need treatment. In some cases, a heart murmur may go away on its own. If an underlying problem or disease is causing the murmur, you may need treatment. If treatment  is needed, it will depend on the type and severity of the disease or heart problem causing the murmur. Treatment may include:  Medicine.  Surgery.  Dietary and lifestyle changes. Follow these instructions at home:  Talk with your health care provider before participating in sports or other activities that require a lot of effort and energy (are strenuous).  Learn as  much as possible about your condition and any related diseases. Ask your health care provider if you may be at risk for any medical emergencies.  Talk with your health care provider about what symptoms you should look out for.  It is up to you to get your test results. Ask your health care provider, or the department that is doing the test, when your results will be ready.  Keep all follow-up visits as told by your health care provider. This is important. Contact a health care provider if:  You are frequently short of breath.  You feel more tired than usual.  You are having a hard time keeping up with normal activities or fitness routines.  You have swelling in your ankles or feet.  You notice that your heart often beats irregularly.  You develop any new symptoms. Get help right away if:  You have chest pain.  You are having trouble breathing.  You feel light-headed or you pass out.  Your symptoms suddenly get worse. These symptoms may represent a serious problem that is an emergency. Do not wait to see if the symptoms will go away. Get medical help right away. Call your local emergency services (911 in the [place] Do not drive yourself to the hospital. Summary  Normally, the heart valves open to let blood flow through or out of your heart, and then they shut to keep the blood from flowing backward.  A heart murmur is caused by heart valves that are not working properly.  You may need treatment if an underlying problem or disease is causing the heart murmur. Treatment may include medicine, surgery, or dietary and lifestyle changes.  Talk with your health care provider before participating in sports or other activities that require a lot of effort and energy (are strenuous).  Talk with your health care provider about what symptoms you should watch out for. This information is not intended to replace advice given to you by your health care provider. Make sure you discuss any  questions you have with your health care provider. Document Revised: 09/13/2017 Document Reviewed: 09/13/2017 Elsevier Patient Education  [employer].  Aortic Valve Stenosis  Aortic valve stenosis is a narrowing of the aortic valve in the heart. The aortic valve opens and closes to regulate blood flow between the left side of the heart (left ventricle) and the artery that leads away from the heart (aorta). When the aortic valve becomes narrow, it is difficult for the heart to pump blood out to the body, which causes the heart to work harder. The extra work can weaken the heart muscle over time. Aortic valve stenosis can range from mild to severe. If it is not treated, it can become more severe over time and lead to heart failure. What are the causes? This condition may be caused by:  Buildup of calcium around and on the aortic valve. This can occur with aging. This is the most common cause of aortic valve stenosis.  A heart problem that developed in the womb (birth defect).  Rheumatic fever.  Radiation to the chest. What increases the risk? You may  be more likely to develop this condition if:  You are older than age 10.  You were born with an abnormal bicuspid valve. What are the signs or symptoms? You may not have any symptoms until your condition becomes severe. It may take 10-20 years for mild or moderate aortic valve stenosis to become severe. Symptoms may include:  Shortness of breath. This may get worse during physical activity.  Feeling unusually weak and tired (fatigue).  Extreme discomfort in the chest, neck, or arm during physical activity (angina).  A heartbeat that is irregular or faster than normal (palpitations).  Dizziness or fainting. This may happen when you get physically tired or after you take certain heart medicines, such as nitroglycerin. How is this diagnosed? This condition may be diagnosed with:  A physical exam.  Echocardiogram. This is a type  of imaging test that uses sound waves (ultrasound) to make images of your heart. There are two kinds of this test that may be used. ? Transthoracic echocardiogram (TTE). For this type, a wand-like tool (transducer) is moved over your chest to create ultrasound images that are recorded by a computer. ? Transesophageal echocardiogram (TEE). For this type, a flexible tube (probe) is inserted down the part of the body that moves food from your mouth to your stomach (esophagus). The heart and the esophagus are close to each other. Your health care provider will use the probe to take clear, detailed pictures of the heart.  Cardiac catheterization. For this procedure, a small, thin tube (catheter) is passed through a large vein in your neck, groin, or arm. The catheter is used to get information about arteries, structures, blood pressure, and oxygen levels in your heart.  Stress tests. These are tests that evaluate the blood supply to your heart and your heart's response to exercise. You may work with a health care provider who specializes in the heart (cardiologist) for diagnosis and treatment. How is this treated? Treatment depends on how severe your condition is and what your symptoms are. You will need to have your heart checked regularly to make sure that your condition is not getting worse or causing serious problems. Treatment may also include:  Surgery to replace your aortic valve. This is the most common treatment for aortic valve stenosis, and it is the only treatment to cure the condition. Several types of surgeries are available. The surgery may be done: ? Through a large incision over your heart (open-heart surgery). ? Through small incisions, using a flexible tube called a catheter (transcatheter aortic valve replacement, TAVR).  Medicines that help to keep your heart rate regular.  Medicines that thin your blood (anticoagulants) to prevent blood clots.  Antibiotic medicines to help prevent  infection. If your condition is mild, you may only need regular follow-up visits for monitoring. Follow these instructions at home: Lifestyle  Limit alcohol intake to no more than 1 drink a day for nonpregnant women and 2 drinks a day for men. One drink equals 12 oz of beer, 5 oz of wine, or 1 oz of hard liquor.  Do not use any products that contain nicotine or tobacco, such as cigarettes and e-cigarettes. If you need help quitting, ask your health care provider.  Work with your health care provider to manage your blood pressure and cholesterol.  Maintain a healthy weight. Eating and drinking   Eat a heart-healthy diet that includes plenty of fresh fruits and vegetables, whole grains, lean protein, and low-fat or nonfat dairy.  Limit how much  caffeine you drink. Caffeine can affect your heart's rate and rhythm.  Avoid foods that are: ? High in salt (sodium), saturated fat, or sugar. ? Canned or highly processed. ? Fried.  Follow instructions from your health care provider about any other eating or drinking restrictions. Activity  Exercise regularly and return to your normal activities as told by your health care provider. Ask your health care provider what amount and type of physical activity is safe for you. ? If your aortic valve stenosis is mild, you may only need to avoid very intense physical activity, such as heavy weight lifting. ? The more severe your aortic valve stenosis is, the more activities you may need to avoid. If you are taking blood thinners:  Before you take any medicines that contain aspirin or NSAIDs, talk with your health care provider. These medicines increase your risk for dangerous bleeding.  Take your medicine exactly as told, at the same time every day.  Avoid activities that could cause injury or bruising, and follow instructions about how to prevent falls.  Wear a medical alert bracelet or carry a card that lists what medicines you take. General  instructions  Take over-the-counter and prescription medicines only as told by your health care provider.  If you were prescribed an antibiotic, take it as told by your health care provider. Do not stop taking the antibiotic even if you start to feel better.  If you are a woman and you plan to become pregnant, talk with your health care provider before you become pregnant.  Before you have any type of medical or dental procedure or surgery, tell all health care providers that you have aortic valve stenosis. This may affect the treatment that you receive.  Keep all follow-up visits as told by your health care provider. This is important. Contact a health care provider if:  You have a fever. Get help right away if:  You develop any of the following symptoms: ? Chest pain. ? Chest tightness. ? Shortness of breath. ? Trouble breathing.  You feel light-headed.  You feel like you might faint.  Your heartbeat is irregular or faster than normal. These symptoms may represent a serious problem that is an emergency. Do not wait to see if the symptoms will go away. Get medical help right away. Call your local emergency services (911 in the [place] Do not drive yourself to the hospital. Summary  Aortic valve stenosis is a narrowing of the aortic valve in the heart. The aortic valve opens and closes to regulate blood flow between the left side of the heart (left ventricle) and the artery that leads away from the heart (aorta).  Aortic valve stenosis can range from mild to severe. If it is not treated, it can become more severe over time and lead to heart failure.  Treatment depends on how severe your condition is and what your symptoms are. You will need to have your heart checked regularly to make sure that your condition is not getting worse or causing serious problems.  Exercise regularly and return to your normal activities as told by your health care provider. Ask your health care provider  what amount and type of physical activity is safe for you. This information is not intended to replace advice given to you by your health care provider. Make sure you discuss any questions you have with your health care provider. Document Revised: 03/04/2017 Document Reviewed: 12/23/2016 Elsevier Patient Education  2020 [employer].

## 2020-01-08 ENCOUNTER — Telehealth: Payer: Self-pay

## 2020-01-08 NOTE — Telephone Encounter (Signed)
The pt was told that Dr. Baird Cancer wanted the pt to come for lab work to check cbc because of dyspnea on exertion and an a1c because of diabetes.  The pt said that she would need to check her schedule and call back.

## 2020-01-22 ENCOUNTER — Other Ambulatory Visit: Payer: Self-pay | Admitting: Internal Medicine

## 2020-02-11 ENCOUNTER — Ambulatory Visit: Payer: Medicare HMO | Admitting: Cardiovascular Disease

## 2020-02-11 ENCOUNTER — Encounter: Payer: Self-pay | Admitting: Cardiovascular Disease

## 2020-02-11 ENCOUNTER — Telehealth: Payer: Self-pay | Admitting: Pharmacist

## 2020-02-11 ENCOUNTER — Other Ambulatory Visit: Payer: Self-pay

## 2020-02-11 VITALS — BP 128/72 | HR 73 | Ht 61.0 in | Wt 153.0 lb

## 2020-02-11 DIAGNOSIS — I350 Nonrheumatic aortic (valve) stenosis: Secondary | ICD-10-CM

## 2020-02-11 DIAGNOSIS — R0600 Dyspnea, unspecified: Secondary | ICD-10-CM | POA: Diagnosis not present

## 2020-02-11 DIAGNOSIS — I10 Essential (primary) hypertension: Secondary | ICD-10-CM

## 2020-02-11 DIAGNOSIS — E785 Hyperlipidemia, unspecified: Secondary | ICD-10-CM | POA: Diagnosis not present

## 2020-02-11 DIAGNOSIS — R0609 Other forms of dyspnea: Secondary | ICD-10-CM

## 2020-02-11 DIAGNOSIS — I6523 Occlusion and stenosis of bilateral carotid arteries: Secondary | ICD-10-CM | POA: Diagnosis not present

## 2020-02-11 HISTORY — DX: Hyperlipidemia, unspecified: E78.5

## 2020-02-11 HISTORY — DX: Nonrheumatic aortic (valve) stenosis: I35.0

## 2020-02-11 HISTORY — DX: Occlusion and stenosis of bilateral carotid arteries: I65.23

## 2020-02-11 NOTE — Patient Instructions (Signed)
Medication Instructions:  Your physician recommends that you continue on your current medications as directed. Please refer to the Current Medication list given to you today.  *If you need a refill on your cardiac medications before your next appointment, please call your pharmacy*  Lab Work: FASTING LP/CMET IN 3 MONTHS   If you have labs (blood work) drawn today and your tests are completely normal, you will receive your results only by: Marland Kitchen MyChart Message (if you have MyChart) OR . A paper copy in the mail If you have any lab test that is abnormal or we need to change your treatment, we will call you to review the results.  Testing/Procedures: Your physician has requested that you have a lexiscan myoview. For further information please visit [email or web address]. Please follow instruction sheet, as given.  Your physician has requested that you have a carotid duplex. This test is an ultrasound of the carotid arteries in your neck. It looks at blood flow through these arteries that supply the brain with blood. Allow one hour for this exam. There are no restrictions or special instructions.  Your physician has requested that you have an echocardiogram. Echocardiography is a painless test that uses sound waves to create images of your heart. It provides your doctor with information about the size and shape of your heart and how well your heart's chambers and valves are working. This procedure takes approximately one hour. There are no restrictions for this procedure. TO BE DONE IN 1 YEAR   Follow-Up: At [facility], you and your health needs are our priority.  As part of our continuing mission to provide you with exceptional heart care, we have created designated Provider Care Teams.  These Care Teams include your primary Cardiologist (physician) and Advanced Practice Providers (APPs -  Physician Assistants and Nurse Practitioners) who all work together to provide you with the care you need,  when you need it.  We recommend signing up for the patient portal called "MyChart".  Sign up information is provided on this After Visit Summary.  MyChart is used to connect with patients for Virtual Visits (Telemedicine).  Patients are able to view lab/test results, encounter notes, upcoming appointments, etc.  Non-urgent messages can be sent to your provider as well.   To learn more about what you can do with MyChart, go to [email or web address].    Your next appointment:   3 month(s) AFTER LABS  The format for your next appointment:   In Person  Provider:   You may see [facility] or one of the following Advanced Practice Providers on your designated Care Team:    Kerin Ransom, PA-C  [place], [place]  Coletta Memos, [place]

## 2020-02-11 NOTE — Progress Notes (Signed)
    Chronic Care Management Pharmacy Assistant   Name: Virginia Russell [facility]  MRN: [id number] DOB: [birth year]  Reason for Encounter: Disease State - Patient Assistance Coordination.    PCP : Glendale Chard, MD  Allergies:   Allergies  Allergen Reactions  . Codeine     Medications: Outpatient Encounter Medications as of 02/11/2020  Medication Sig  . Accu-Chek Softclix Lancets lancets Use as instructed to check blood sugars 1 time per day dx: e11.22  . Alcohol Swabs (B-D SINGLE USE SWABS BUTTERFLY) PADS 100 Pieces by Does not apply route as needed.  . Alogliptin Benzoate (NESINA) 25 MG TABS Take 1 tablet by mouth daily.  Marland Kitchen aspirin 81 MG tablet Take 81 mg by mouth daily.  Marland Kitchen atorvastatin (LIPITOR) 40 MG tablet TAKE 1 TABLET EVERY NIGHT  . Blood Glucose Monitoring Suppl (ACCU-CHEK AVIVA PLUS) w/Device KIT Use as directed to check blood sugars 1 time per day dx: e11.22  . celecoxib (CELEBREX) 200 MG capsule Take 1 capsule (200 mg total) by mouth 2 (two) times daily.  Marland Kitchen glucose blood (ACCU-CHEK AVIVA PLUS) test strip Use as instructed to check blood sugars 1 time per day dx: e11.22  . losartan (COZAAR) 50 MG tablet TAKE 1 TABLET EVERY DAY  . metFORMIN (GLUCOPHAGE-XR) 750 MG 24 hr tablet TAKE 2 TABLETS EVERY EVENING  . pioglitazone (ACTOS) 30 MG tablet TAKE 1 TABLET EVERY DAY  . venlafaxine (EFFEXOR) 37.5 MG tablet TAKE 1 TABLET EVERY DAY   No facility-administered encounter medications on file as of 02/11/2020.    Current Diagnosis: Patient Active Problem List   Diagnosis Date Noted  . Aortic stenosis, mild 02/11/2020  . Hyperlipidemia LDL goal <70 02/11/2020  . Carotid stenosis, bilateral 02/11/2020  . Osteopenia after menopause 09/19/2018  . Overweight (BMI 25.0-29.9) 09/19/2018  . Uncontrolled type 2 diabetes mellitus with hyperglycemia ([place]) 05/15/2018  . Essential hypertension, benign 05/15/2018  . Recurrent major depressive disorder, in partial remission ([place]) 05/15/2018  .  Postmenopausal bleeding 12/28/2012  . Dyspareunia 12/28/2012  . Menopause 12/28/2012  . Vaginal atrophy 12/28/2012      Follow-Up:  Patient Assistance Coordination - New Application filled out to [employer] for [place] . Awaiting provider signature to fax.  Beverly Milch ,CPP Notified.  Judithann Sheen, [facility] Clinical Pharmacist Assistant [phone]

## 2020-02-11 NOTE — Progress Notes (Signed)
Cardiology Office Note   Date:  02/11/2020   ID:  Sheniya Garciaperez [place], [place] July 30, 1945, MRN [id number]  PCP:  Glendale Chard, MD  Cardiologist:   Skeet Latch, MD   No chief complaint on file.     History of Present Illness: Virginia Russell is a 74 y.o. female with mild aortic stenosis, mild carotid stenosis, diabetes, hypertension, hyperlipidemia, and obesity who presents for evaluation of abnormal echocardiogram.  She saw Dr. Baird Cancer on 10/2019 and was noted to have a heart murmur.  She was referred for an echocardiogram 11/2019 that revealed LVEF 55 to 60% with normal diastolic function.  Her aortic valve had moderate calcification and very mild aortic stenosis.  The mean gradient was 9 mmHg.  There is also mild aortic regurgitation.  She notes that when she goes up and down steps she gets short of breath.  It is worse when carrying a vacuum cleaner or when she is upset.  When this happens she has a hard time catching a deep breath.  She has no exertional chest pain or pressure.  She notes some swelling in her knees but no lower extremity edema.  She denies orthopnea or PND.  Her heart sometimes races when she is upset.  She cares for her husband who has dementia and she thinks he has bipolar disorder.  Her daughter is here today and is very helpful to her.  She has never smoked or drink alcohol.  She does have several family members, including both parents, who had heart attacks.  Her father also had a stroke.  When she checks her blood pressure at home it typically is in the 1 teens over 27s.  She notes that she cooks at home and does not eat out regularly.  She and her daughter both note that her diet could be better.  She struggles with eating carbohydrates.  She had carotid Dopplers 07/2015 that revealed mild stenosis bilaterally.   Past Medical History:  Diagnosis Date  . Anxiety   . Aortic stenosis, mild 02/11/2020   Mean gradient 9 mmHg on 11/2019  . Carotid stenosis,  bilateral 02/11/2020  . Diabetes mellitus without complication ([facility])   . High cholesterol   . HTN (hypertension)   . Hyperlipidemia LDL goal <70 02/11/2020    Past Surgical History:  Procedure Laterality Date  . ABDOMINAL HYSTERECTOMY     ABDOMINAL HYSTERECTOMY  . BACK SURGERY    . BREAST BIOPSY Left   . NECK SURGERY    . SHOULDER SURGERY     X2     Current Outpatient Medications  Medication Sig Dispense Refill  . Accu-Chek Softclix Lancets lancets Use as instructed to check blood sugars 1 time per day dx: e11.22 150 each 3  . Alcohol Swabs (B-D SINGLE USE SWABS BUTTERFLY) PADS 100 Pieces by Does not apply route as needed. 100 each 3  . Alogliptin Benzoate (NESINA) 25 MG TABS Take 1 tablet by mouth daily. 90 tablet 1  . aspirin 81 MG tablet Take 81 mg by mouth daily.    Marland Kitchen atorvastatin (LIPITOR) 40 MG tablet TAKE 1 TABLET EVERY NIGHT 90 tablet 1  . Blood Glucose Monitoring Suppl (ACCU-CHEK AVIVA PLUS) w/Device KIT Use as directed to check blood sugars 1 time per day dx: e11.22 1 kit 1  . celecoxib (CELEBREX) 200 MG capsule Take 1 capsule (200 mg total) by mouth 2 (two) times daily. 90 capsule 2  . glucose blood (ACCU-CHEK AVIVA PLUS) test strip Use  as instructed to check blood sugars 1 time per day dx: e11.22 150 each 3  . losartan (COZAAR) 50 MG tablet TAKE 1 TABLET EVERY DAY 90 tablet 1  . metFORMIN (GLUCOPHAGE-XR) 750 MG 24 hr tablet TAKE 2 TABLETS EVERY EVENING 180 tablet 1  . pioglitazone (ACTOS) 30 MG tablet TAKE 1 TABLET EVERY DAY 90 tablet 1  . venlafaxine (EFFEXOR) 37.5 MG tablet TAKE 1 TABLET EVERY DAY 90 tablet 1   No current facility-administered medications for this visit.    Allergies:   Codeine    Social History:  The patient  reports that she has never smoked. She has never used smokeless tobacco. She reports that she does not drink alcohol and does not use drugs.   Family History:  The patient's family history includes Breast cancer in her mother; Heart attack  in her brother, daughter, father, maternal grandmother, mother, and paternal grandmother; Heart disease in her mother; Stroke in her father.    ROS:  Please see the history of present illness.   Otherwise, review of systems are positive for none.   All other systems are reviewed and negative.    PHYSICAL EXAM: VS:  BP 128/72   Pulse 73   Ht '5\' 1"'  (1.549 m)   Wt 153 lb (69.4 kg)   SpO2 97%   BMI 28.91 kg/m  , BMI Body mass index is 28.91 kg/m. GENERAL:  Well appearing HEENT:  Pupils equal round and reactive, fundi not visualized, oral mucosa unremarkable NECK:  No jugular venous distention, waveform within normal limits, carotid upstroke brisk and symmetric, +R carotid bruit, no thyromegaly LYMPHATICS:  No cervical adenopathy LUNGS:  Clear to auscultation bilaterally HEART:  RRR.  PMI not displaced or sustained,S1 and S2 within normal limits, no S3, no S4, no clicks, no rubs, II/VI systolic murmur at the LUSB ABD:  Flat, positive bowel sounds normal in frequency in pitch, no bruits, no rebound, no guarding, no midline pulsatile mass, no hepatomegaly, no splenomegaly EXT:  2 plus pulses throughout, no edema, no cyanosis no clubbing SKIN:  No rashes no nodules NEURO:  Cranial nerves II through XII grossly intact, motor grossly intact throughout PSYCH:  Cognitively intact, oriented to person place and time   EKG:  EKG is ordered today. The ekg ordered today demonstrates sinus rhythm.  Rate 73 bpm.  PVC.  Echo 12/03/19: Left ventricle cavity is normal in size and wall thickness. Normal global  wall motion. Normal LV systolic function with visual EF 55-60%. Normal  diastolic filling pattern.  Left atrial cavity is mildly dilated.  Trileaflet aortic valve with moderate calcification. Mild aortic stenosis.  Vmax 2.0 m/sec, mean PG 9 mmHg, AVA 1.2 cm2. Mild (Grade I) aortic  regurgitation.  Mild (Grade I) mitral regurgitation.  Mild tricuspid regurgitation. Estimated pulmonary artery  systolic pressure  31 mmHg.   Recent Labs: 03/07/2019: Hemoglobin 12.6; Platelets 144 06/19/2019: TSH 1.870 10/23/2019: ALT 8; BUN 16; Creatinine, Ser 0.62; Potassium 4.8; Sodium 141    Lipid Panel    Component Value Date/Time   CHOL 163 10/23/2019 0905   TRIG 88 10/23/2019 0905   HDL 66 10/23/2019 0905   CHOLHDL 2.5 10/23/2019 0905   LDLCALC 81 10/23/2019 0905      Wt Readings from Last 3 Encounters:  02/11/20 153 lb (69.4 kg)  01/01/20 156 lb (70.8 kg)  10/23/19 153 lb (69.4 kg)      ASSESSMENT AND PLAN:  # Mild aortic stenosis:  Mean gradient is only  9 mmHg.  This is not causing any of her current symptoms.  Repeat echocardiogram in 1 year.  # Exertional dyspnea: She has several risk factors for CAD.  We will get a Lexiscan Myoview to assess for ischemia.  She is unable to walk on a treadmill due to her knees.  We discussed coronary CT-A.  Given that we already know her LDL goal is less than 70, not sure that this is necessary or helpful.  I suspect she may have a significant calcium burden that may make interpretation more challenging.  # Mild carotid stenosis:  # Hyperlipidemia:  Mild carotid stenosis on carotid Doppler 07/2015.  She has a bruit on the right.  Repeat carotid Dopplers.  Continue aspirin and atorvastatin.  She is going to work on limiting the fried foods, fatty foods, red meat, and cheese in her diet.  Repeat lipids and a CMP in 3 months.  If her LDL remains greater than 70 we will need to increase atorvastatin to 80 mg.  # Hypertension:  BP well-controlled on losartan.   Current medicines are reviewed at length with the patient today.  The patient does not have concerns regarding medicines.  The following changes have been made:  no change  Labs/ tests ordered today include:   Orders Placed This Encounter  Procedures  . Lipid panel  . Comprehensive metabolic panel  . MYOCARDIAL PERFUSION IMAGING  . EKG 12-Lead  . ECHOCARDIOGRAM COMPLETE  . VAS  US CAROTID     Disposition:   FU with Aniken Monestime C. Oval Linsey, MD, [facility] in 3 months.     Signed, Keymarion Bearman C. Oval Linsey, MD, [facility]  02/11/2020 9:50 AM    [place]

## 2020-02-13 ENCOUNTER — Telehealth: Payer: Self-pay

## 2020-02-13 NOTE — Telephone Encounter (Signed)
The pt was notified that her forms for the [employer] patient assistance has been faxed, the pt's original was left up front for her to pickup and a copy was kept for her chart.

## 2020-02-22 ENCOUNTER — Telehealth (HOSPITAL_COMMUNITY): Payer: Self-pay | Admitting: *Deleted

## 2020-02-22 NOTE — Telephone Encounter (Signed)
Close encounter 

## 2020-02-26 ENCOUNTER — Other Ambulatory Visit: Payer: Self-pay

## 2020-02-26 ENCOUNTER — Ambulatory Visit (HOSPITAL_BASED_OUTPATIENT_CLINIC_OR_DEPARTMENT_OTHER)
Admission: RE | Admit: 2020-02-26 | Discharge: 2020-02-26 | Disposition: A | Discharge status: Home / Self Care | Payer: Medicare HMO | Source: Ambulatory Visit | Attending: Cardiovascular Disease | Admitting: Cardiovascular Disease

## 2020-02-26 ENCOUNTER — Ambulatory Visit (HOSPITAL_COMMUNITY)
Admission: RE | Admit: 2020-02-26 | Discharge: 2020-02-26 | Disposition: A | Discharge status: Home / Self Care | Payer: Medicare HMO | Source: Ambulatory Visit | Attending: Cardiovascular Disease | Admitting: Cardiovascular Disease

## 2020-02-26 DIAGNOSIS — I10 Essential (primary) hypertension: Secondary | ICD-10-CM

## 2020-02-26 DIAGNOSIS — R0600 Dyspnea, unspecified: Secondary | ICD-10-CM

## 2020-02-26 DIAGNOSIS — I350 Nonrheumatic aortic (valve) stenosis: Secondary | ICD-10-CM | POA: Insufficient documentation

## 2020-02-26 DIAGNOSIS — I6523 Occlusion and stenosis of bilateral carotid arteries: Secondary | ICD-10-CM | POA: Diagnosis not present

## 2020-02-26 DIAGNOSIS — R0609 Other forms of dyspnea: Secondary | ICD-10-CM

## 2020-02-26 LAB — MYOCARDIAL PERFUSION IMAGING
LV dias vol: 90 mL (ref 46–106)
LV sys vol: 40 mL
Peak HR: 88 {beats}/min
Rest HR: 59 {beats}/min
SDS: 2
SRS: 4
SSS: 6
TID: 0.98

## 2020-02-26 MED ORDER — TECHNETIUM TC 99M TETROFOSMIN IV KIT
10.9000 | PACK | Freq: Once | INTRAVENOUS | Status: AC | PRN
  Administered 2020-02-26: 10.9 via INTRAVENOUS
  Filled 2020-02-26: qty 11

## 2020-02-26 MED ORDER — TECHNETIUM TC 99M TETROFOSMIN IV KIT
32.0000 | PACK | Freq: Once | INTRAVENOUS | Status: AC | PRN
  Administered 2020-02-26: 32 via INTRAVENOUS
  Filled 2020-02-26: qty 32

## 2020-02-26 MED ORDER — REGADENOSON 0.4 MG/5ML IV SOLN
0.4000 mg | Freq: Once | INTRAVENOUS | Status: AC
  Administered 2020-02-26: 0.4 mg via INTRAVENOUS

## 2020-03-10 ENCOUNTER — Other Ambulatory Visit: Payer: Self-pay

## 2020-03-10 DIAGNOSIS — I6523 Occlusion and stenosis of bilateral carotid arteries: Secondary | ICD-10-CM

## 2020-03-10 NOTE — Progress Notes (Signed)
NOT NEEDED

## 2020-03-13 ENCOUNTER — Ambulatory Visit (INDEPENDENT_AMBULATORY_CARE_PROVIDER_SITE_OTHER): Payer: Medicare HMO | Admitting: Internal Medicine

## 2020-03-13 ENCOUNTER — Ambulatory Visit (INDEPENDENT_AMBULATORY_CARE_PROVIDER_SITE_OTHER): Payer: Medicare HMO

## 2020-03-13 ENCOUNTER — Other Ambulatory Visit: Payer: Self-pay

## 2020-03-13 ENCOUNTER — Encounter: Payer: Self-pay | Admitting: Internal Medicine

## 2020-03-13 VITALS — BP 132/64 | HR 84 | Temp 97.7°F | Ht 60.4 in | Wt 153.8 lb

## 2020-03-13 VITALS — BP 132/64 | HR 84 | Temp 97.7°F | Ht 64.0 in | Wt 153.8 lb

## 2020-03-13 DIAGNOSIS — E663 Overweight: Secondary | ICD-10-CM | POA: Diagnosis not present

## 2020-03-13 DIAGNOSIS — Z79899 Other long term (current) drug therapy: Secondary | ICD-10-CM | POA: Diagnosis not present

## 2020-03-13 DIAGNOSIS — E7800 Pure hypercholesterolemia, unspecified: Secondary | ICD-10-CM | POA: Diagnosis not present

## 2020-03-13 DIAGNOSIS — E1165 Type 2 diabetes mellitus with hyperglycemia: Secondary | ICD-10-CM | POA: Diagnosis not present

## 2020-03-13 DIAGNOSIS — Z0000 Encounter for general adult medical examination without abnormal findings: Secondary | ICD-10-CM

## 2020-03-13 DIAGNOSIS — I10 Essential (primary) hypertension: Secondary | ICD-10-CM | POA: Diagnosis not present

## 2020-03-13 LAB — POCT URINALYSIS DIPSTICK
Bilirubin, UA: NEGATIVE
Blood, UA: NEGATIVE
Glucose, UA: NEGATIVE
Ketones, UA: NEGATIVE
Nitrite, UA: NEGATIVE
Protein, UA: NEGATIVE
Spec Grav, UA: 1.025 (ref 1.010–1.025)
Urobilinogen, UA: 0.2 E.U./dL
pH, UA: 6 (ref 5.0–8.0)

## 2020-03-13 NOTE — Patient Instructions (Signed)
Health Maintenance After Age 75 After age 75, you are at a higher risk for certain long-term diseases and infections as well as injuries from falls. Falls are a major cause of broken bones and head injuries in people who are older than age 75. Getting regular preventive care can help to keep you healthy and well. Preventive care includes getting regular testing and making lifestyle changes as recommended by your health care provider. Talk with your health care provider about:  Which screenings and tests you should have. A screening is a test that checks for a disease when you have no symptoms.  A diet and exercise plan that is right for you. What should I know about screenings and tests to prevent falls? Screening and testing are the best ways to find a health problem early. Early diagnosis and treatment give you the best chance of managing medical conditions that are common after age 75. Certain conditions and lifestyle choices may make you more likely to have a fall. Your health care provider may recommend:  Regular vision checks. Poor vision and conditions such as cataracts can make you more likely to have a fall. If you wear glasses, make sure to get your prescription updated if your vision changes.  Medicine review. Work with your health care provider to regularly review all of the medicines you are taking, including over-the-counter medicines. Ask your health care provider about any side effects that may make you more likely to have a fall. Tell your health care provider if any medicines that you take make you feel dizzy or sleepy.  Osteoporosis screening. Osteoporosis is a condition that causes the bones to get weaker. This can make the bones weak and cause them to break more easily.  Blood pressure screening. Blood pressure changes and medicines to control blood pressure can make you feel dizzy.  Strength and balance checks. Your health care provider may recommend certain tests to check your  strength and balance while standing, walking, or changing positions.  Foot health exam. Foot pain and numbness, as well as not wearing proper footwear, can make you more likely to have a fall.  Depression screening. You may be more likely to have a fall if you have a fear of falling, feel emotionally low, or feel unable to do activities that you used to do.  Alcohol use screening. Using too much alcohol can affect your balance and may make you more likely to have a fall. What actions can I take to lower my risk of falls? General instructions  Talk with your health care provider about your risks for falling. Tell your health care provider if: ? You fall. Be sure to tell your health care provider about all falls, even ones that seem minor. ? You feel dizzy, sleepy, or off-balance.  Take over-the-counter and prescription medicines only as told by your health care provider. These include any supplements.  Eat a healthy diet and maintain a healthy weight. A healthy diet includes low-fat dairy products, low-fat (lean) meats, and fiber from whole grains, beans, and lots of fruits and vegetables. Home safety  Remove any tripping hazards, such as rugs, cords, and clutter.  Install safety equipment such as grab bars in bathrooms and safety rails on stairs.  Keep rooms and walkways well-lit. Activity   Follow a regular exercise program to stay fit. This will help you maintain your balance. Ask your health care provider what types of exercise are appropriate for you.  If you need a cane or   walker, use it as recommended by your health care provider.  Wear supportive shoes that have nonskid soles. Lifestyle  Do not drink alcohol if your health care provider tells you not to drink.  If you drink alcohol, limit how much you have: ? 0-1 drink a day for women. ? 0-2 drinks a day for men.  Be aware of how much alcohol is in your drink. In the [place] one drink equals one typical bottle of beer (12  oz), one-half glass of wine (5 oz), or one shot of hard liquor (1 oz).  Do not use any products that contain nicotine or tobacco, such as cigarettes and e-cigarettes. If you need help quitting, ask your health care provider. Summary  Having a healthy lifestyle and getting preventive care can help to protect your health and wellness after age 75.  Screening and testing are the best way to find a health problem early and help you avoid having a fall. Early diagnosis and treatment give you the best chance for managing medical conditions that are more common for people who are older than age 75.  Falls are a major cause of broken bones and head injuries in people who are older than age 75. Take precautions to prevent a fall at home.  Work with your health care provider to learn what changes you can make to improve your health and wellness and to prevent falls. This information is not intended to replace advice given to you by your health care provider. Make sure you discuss any questions you have with your health care provider. Document Revised: 07/13/2018 Document Reviewed: 02/02/2017 [employer] Patient Education  2020 [employer]  

## 2020-03-13 NOTE — Progress Notes (Signed)
I,Virginia Russell,acting as a Education administrator for Virginia Greenland, MD.,have documented all relevant documentation on the behalf of Virginia Greenland, MD,as directed by  Virginia Greenland, MD while in the presence of Virginia Greenland, MD.  This visit occurred during the SARS-CoV-2 public health emergency.  Safety protocols were in place, including screening questions prior to the visit, additional usage of staff PPE, and extensive cleaning of exam room while observing appropriate contact time as indicated for disinfecting solutions.  Subjective:     Patient ID: Virginia Russell , female    DOB: [birth year] , 74 y.o.   MRN: [id number]   Chief Complaint  Patient presents with  . Annual Exam  . Diabetes  . Hypertension    HPI  She is here today for a full physical examination. She is no longer followed by GYN. She has no specific concerns or complaints at this time.   Diabetes She presents for her follow-up diabetic visit. She has type 2 diabetes mellitus. Her disease course has been stable. There are no hypoglycemic associated symptoms. Pertinent negatives for diabetes include no blurred vision and no chest pain. There are no hypoglycemic complications. Risk factors for coronary artery disease include dyslipidemia, diabetes mellitus, hypertension, post-menopausal and stress. She is compliant with treatment most of the time. She is following a diabetic diet. She participates in exercise intermittently. Her breakfast blood glucose is taken between 7-8 am. Her breakfast blood glucose range is generally 90-110 mg/dl. An ACE inhibitor/angiotensin II receptor blocker is being taken. Eye exam is current.  Hypertension This is a chronic problem. The current episode started more than 1 year ago. The problem has been gradually improving since onset. The problem is controlled. Pertinent negatives include no blurred vision, chest pain, palpitations or shortness of breath. Past treatments include ACE inhibitors and  diuretics. The current treatment provides moderate improvement.     Past Medical History:  Diagnosis Date  . Anxiety   . Aortic stenosis, mild 02/11/2020   Mean gradient 9 mmHg on 11/2019  . Carotid stenosis, bilateral 02/11/2020  . Diabetes mellitus without complication ([place])   . High cholesterol   . HTN (hypertension)   . Hyperlipidemia LDL goal <70 02/11/2020     Family History  Problem Relation Age of Onset  . Breast cancer Mother   . Heart disease Mother   . Heart attack Mother   . Stroke Father   . Heart attack Father   . Heart attack Daughter   . Heart attack Brother   . Heart attack Maternal Grandmother   . Heart attack Paternal Grandmother      Current Outpatient Medications:  .  Accu-Chek Softclix Lancets lancets, Use as instructed to check blood sugars 1 time per day dx: e11.22, Disp: 150 each, Rfl: 3 .  Alcohol Swabs (B-D SINGLE USE SWABS BUTTERFLY) PADS, 100 Pieces by Does not apply route as needed., Disp: 100 each, Rfl: 3 .  Alogliptin Benzoate (NESINA) 25 MG TABS, Take 1 tablet by mouth daily., Disp: 90 tablet, Rfl: 1 .  aspirin 81 MG tablet, Take 81 mg by mouth daily., Disp: , Rfl:  .  atorvastatin (LIPITOR) 40 MG tablet, TAKE 1 TABLET EVERY NIGHT, Disp: 90 tablet, Rfl: 1 .  Blood Glucose Monitoring Suppl (ACCU-CHEK AVIVA PLUS) w/Device KIT, Use as directed to check blood sugars 1 time per day dx: e11.22, Disp: 1 kit, Rfl: 1 .  celecoxib (CELEBREX) 200 MG capsule, Take 1 capsule (200 mg total) by mouth 2 (  two) times daily., Disp: 90 capsule, Rfl: 2 .  glucose blood (ACCU-CHEK AVIVA PLUS) test strip, Use as instructed to check blood sugars 1 time per day dx: e11.22, Disp: 150 each, Rfl: 3 .  losartan (COZAAR) 50 MG tablet, TAKE 1 TABLET EVERY DAY, Disp: 90 tablet, Rfl: 1 .  metFORMIN (GLUCOPHAGE-XR) 750 MG 24 hr tablet, TAKE 2 TABLETS EVERY EVENING, Disp: 180 tablet, Rfl: 1 .  pioglitazone (ACTOS) 30 MG tablet, TAKE 1 TABLET EVERY DAY, Disp: 90 tablet, Rfl: 1 .   venlafaxine (EFFEXOR) 37.5 MG tablet, TAKE 1 TABLET EVERY DAY, Disp: 90 tablet, Rfl: 1   Allergies  Allergen Reactions  . Codeine       The patient states she uses none for birth control. Last LMP was No LMP recorded. Patient has had a hysterectomy.. Negative for Dysmenorrhea. Negative for: breast discharge, breast lump(s), breast pain and breast self exam. Associated symptoms include abnormal vaginal bleeding. Pertinent negatives include abnormal bleeding (hematology), anxiety, decreased libido, depression, difficulty falling sleep, dyspareunia, history of infertility, nocturia, sexual dysfunction, sleep disturbances, urinary incontinence, urinary urgency, vaginal discharge and vaginal itching. Diet regular.The patient states her exercise level is  intermittent, but she has active lifestyle. She cleans houses daily for a living.   . The patient's tobacco use is:  Social History   Tobacco Use  Smoking Status Never Smoker  Smokeless Tobacco Never Used  . She has been exposed to passive smoke. The patient's alcohol use is:  Social History   Substance and Sexual Activity  Alcohol Use No   Review of Systems  Constitutional: Negative.   HENT: Negative.   Eyes: Negative.  Negative for blurred vision.  Respiratory: Negative.  Negative for shortness of breath.   Cardiovascular: Negative.  Negative for chest pain and palpitations.  Gastrointestinal: Negative.   Endocrine: Negative.   Genitourinary: Negative.   Musculoskeletal: Negative.   Skin: Negative.   Allergic/Immunologic: Negative.   Neurological: Negative.   Hematological: Negative.   Psychiatric/Behavioral: Negative.      Today's Vitals   03/13/20 1427  BP: 132/64  Pulse: 84  Temp: 97.7 F (36.5 C)  SpO2: 94%  Weight: 153 lb 12.8 oz (69.8 kg)  Height: _0  (1.626 m)  PainSc: 0-No pain   Body mass index is 26.4 kg/m.  Wt Readings from Last 3 Encounters:  03/13/20 153 lb 12.8 oz (69.8 kg)  03/13/20 153 lb 12.8 oz  (69.8 kg)  02/26/20 153 lb (69.4 kg)   Objective:  Physical Exam Vitals and nursing note reviewed.  Constitutional:      General: She is not in acute distress.    Appearance: Normal appearance. She is well-developed.  HENT:     Head: Normocephalic and atraumatic.     Right Ear: Hearing, tympanic membrane, ear canal and external ear normal. There is no impacted cerumen.     Left Ear: Hearing, tympanic membrane, ear canal and external ear normal. There is no impacted cerumen.     Nose:     Comments: Deferred, masked    Mouth/Throat:     Comments: Deferred, masked Eyes:     General: Lids are normal.     Extraocular Movements: Extraocular movements intact.     Conjunctiva/sclera: Conjunctivae normal.  Neck:     Thyroid: No thyroid mass.     Vascular: No carotid bruit.  Cardiovascular:     Rate and Rhythm: Normal rate and regular rhythm.     Pulses: Normal pulses.  Dorsalis pedis pulses are 2+ on the right side and 2+ on the left side.     Heart sounds: Murmur heard.    Pulmonary:     Effort: Pulmonary effort is normal.     Breath sounds: Normal breath sounds.  Chest:  Breasts:     Tanner Score is 5.     Right: Normal.     Left: Normal.    Abdominal:     General: Abdomen is flat. Bowel sounds are normal. There is no distension.     Palpations: Abdomen is soft.     Tenderness: There is no abdominal tenderness.  Genitourinary:    Comments: Deferred Musculoskeletal:        General: No swelling. Normal range of motion.     Cervical back: Full passive range of motion without pain, normal range of motion and neck supple.     Right lower leg: No edema.     Left lower leg: No edema.  Feet:     Right foot:     Protective Sensation: 5 sites tested. 5 sites sensed.     Skin integrity: Dry skin present.     Toenail Condition: Right toenails are normal.     Left foot:     Protective Sensation: 5 sites tested. 5 sites sensed.     Toenail Condition: Left toenails are  normal.  Skin:    General: Skin is warm and dry.     Capillary Refill: Capillary refill takes less than 2 seconds.  Neurological:     General: No focal deficit present.     Mental Status: She is alert and oriented to person, place, and time.     Cranial Nerves: No cranial nerve deficit.     Sensory: No sensory deficit.  Psychiatric:        Mood and Affect: Mood normal.        Behavior: Behavior normal.        Thought Content: Thought content normal.        Judgment: Judgment normal.         Assessment And Plan:     1. Routine general medical examination at health care facility Comments: A full exam was performed. Importance of monthly self breast exams was discussed with the patient. PATIENT IS ADVISED TO GET 30-45 MINUTES REGULAR EXERCISE NO LESS THAN FOUR TO FIVE DAYS PER WEEK - BOTH WEIGHTBEARING EXERCISES AND AEROBIC ARE RECOMMENDED.  PATIENT IS ADVISED TO FOLLOW A HEALTHY DIET WITH AT LEAST SIX FRUITS/VEGGIES PER DAY, DECREASE INTAKE OF RED MEAT, AND TO INCREASE FISH INTAKE TO TWO DAYS PER WEEK.  MEATS/FISH SHOULD NOT BE FRIED, BAKED OR BROILED IS PREFERABLE.  I SUGGEST WEARING SPF 50 SUNSCREEN ON EXPOSED PARTS AND ESPECIALLY WHEN IN THE DIRECT SUNLIGHT FOR AN EXTENDED PERIOD OF TIME.  PLEASE AVOID FAST FOOD RESTAURANTS AND INCREASE YOUR WATER INTAKE.  2. Uncontrolled type 2 diabetes mellitus with hyperglycemia ([facility]) Comments: Diabetic foot exam performed. I will decrease dose of Actos to 35m. After that, I plan to wean her off completely. I will likely need to add GLP-1 therapy and/or SGLT2-inhibitor therapy.  I DISCUSSED WITH THE PATIENT AT LENGTH REGARDING THE GOALS OF GLYCEMIC CONTROL AND POSSIBLE LONG-TERM COMPLICATIONS.  I  ALSO STRESSED THE IMPORTANCE OF COMPLIANCE WITH HOME GLUCOSE MONITORING, DIETARY RESTRICTIONS INCLUDING AVOIDANCE OF SUGARY DRINKS/PROCESSED FOODS,  ALONG WITH REGULAR EXERCISE.  I  ALSO STRESSED THE IMPORTANCE OF ANNUAL EYE EXAMS, SELF FOOT CARE AND COMPLIANCE  WITH OFFICE VISITS. -  Hemoglobin A1c - CBC - BMP8+EGFR  3. Essential hypertension, benign Comments: Chronic, controlled. She will continue with current meds. EKG not performed, EKG from Cardiology reviewed. Advised to follow low salt diet.   4. Pure hypercholesterolemia Comments: Chronic. She is encouraged to comply with statin therapy, follow heart healthy lifestyle, exercise regularly and avoid fried foods.   5. Overweight (BMI 25.0-29.9) Comments: Her BMI is acceptable for her demographic. She is advised to aim for at least 150 minutes of exercise per week.   6. Drug therapy - Vitamin B12  Patient was given opportunity to ask questions. Patient verbalized understanding of the plan and was able to repeat key elements of the plan. All questions were answered to their satisfaction.  Virginia Greenland, MD   I, Virginia Greenland, MD, have reviewed all documentation for this visit. The documentation on 03/31/20 for the exam, diagnosis, procedures, and orders are all accurate and complete. THE PATIENT IS ENCOURAGED TO PRACTICE SOCIAL DISTANCING DUE TO THE COVID-19 PANDEMIC.

## 2020-03-13 NOTE — Progress Notes (Signed)
This visit occurred during the SARS-CoV-2 public health emergency.  Safety protocols were in place, including screening questions prior to the visit, additional usage of staff PPE, and extensive cleaning of exam room while observing appropriate contact time as indicated for disinfecting solutions.  Subjective:   Virginia Russell is a 74 y.o. female who presents for Medicare Annual (Subsequent) preventive examination.  Review of Systems     Cardiac Risk Factors include: advanced age (>49mn, >>26women);diabetes mellitus;dyslipidemia;hypertension;sedentary lifestyle     Objective:    Today's Vitals   03/13/20 1407  BP: 132/64  Pulse: 84  Temp: 97.7 F (36.5 C)  TempSrc: Oral  SpO2: 94%  Weight: 153 lb 12.8 oz (69.8 kg)  Height: 5' 0.4" (1.534 m)   Body mass index is 29.64 kg/m.  Advanced Directives 03/13/2020 03/07/2019  Does Patient Have a Medical Advance Directive? Yes Yes  Type of AParamedicof [place]orth [place] will [place] will  Copy of HReadingin Chart? No - copy requested No - copy requested    Current Medications (verified) Outpatient Encounter Medications as of 03/13/2020  Medication Sig  . Accu-Chek Softclix Lancets lancets Use as instructed to check blood sugars 1 time per day dx: e11.22  . Alcohol Swabs (B-D SINGLE USE SWABS BUTTERFLY) PADS 100 Pieces by Does not apply route as needed.  . Alogliptin Benzoate (NESINA) 25 MG TABS Take 1 tablet by mouth daily.  .Marland Kitchenaspirin 81 MG tablet Take 81 mg by mouth daily.  .Marland Kitchenatorvastatin (LIPITOR) 40 MG tablet TAKE 1 TABLET EVERY NIGHT  . Blood Glucose Monitoring Suppl (ACCU-CHEK AVIVA PLUS) w/Device KIT Use as directed to check blood sugars 1 time per day dx: e11.22  . celecoxib (CELEBREX) 200 MG capsule Take 1 capsule (200 mg total) by mouth 2 (two) times daily.  .Marland Kitchenglucose blood (ACCU-CHEK AVIVA PLUS) test strip Use as instructed to check blood sugars 1  time per day dx: e11.22  . losartan (COZAAR) 50 MG tablet TAKE 1 TABLET EVERY DAY  . metFORMIN (GLUCOPHAGE-XR) 750 MG 24 hr tablet TAKE 2 TABLETS EVERY EVENING  . pioglitazone (ACTOS) 30 MG tablet TAKE 1 TABLET EVERY DAY  . venlafaxine (EFFEXOR) 37.5 MG tablet TAKE 1 TABLET EVERY DAY   No facility-administered encounter medications on file as of 03/13/2020.    Allergies (verified) Codeine   History: Past Medical History:  Diagnosis Date  . Anxiety   . Aortic stenosis, mild 02/11/2020   Mean gradient 9 mmHg on 11/2019  . Carotid stenosis, bilateral 02/11/2020  . Diabetes mellitus without complication ([place]   . High cholesterol   . HTN (hypertension)   . Hyperlipidemia LDL goal <70 02/11/2020   Past Surgical History:  Procedure Laterality Date  . ABDOMINAL HYSTERECTOMY     ABDOMINAL HYSTERECTOMY  . BACK SURGERY    . BREAST BIOPSY Left   . NECK SURGERY    . SHOULDER SURGERY     X2   Family History  Problem Relation Age of Onset  . Breast cancer Mother   . Heart disease Mother   . Heart attack Mother   . Stroke Father   . Heart attack Father   . Heart attack Daughter   . Heart attack Brother   . Heart attack Maternal Grandmother   . Heart attack Paternal Grandmother    Social History   Socioeconomic History  . Marital status: Married    Spouse name: Not on file  . Number  of children: Not on file  . Years of education: Not on file  . Highest education level: Not on file  Occupational History  . Occupation: retired  Tobacco Use  . Smoking status: Never Smoker  . Smokeless tobacco: Never Used  Vaping Use  . Vaping Use: Never used  Substance and Sexual Activity  . Alcohol use: No  . Drug use: Never  . Sexual activity: Not Currently  Other Topics Concern  . Not on file  Social History Narrative  . Not on file   Social Determinants of Health   Financial Resource Strain: Low Risk   . Difficulty of Paying Living Expenses: Not hard at all  Food Insecurity: No  Food Insecurity  . Worried About Charity fundraiser in the Last Year: Never true  . Ran Out of Food in the Last Year: Never true  Transportation Needs: No Transportation Needs  . Lack of Transportation (Medical): No  . Lack of Transportation (Non-Medical): No  Physical Activity: Inactive  . Days of Exercise per Week: 0 days  . Minutes of Exercise per Session: 0 min  Stress: No Stress Concern Present  . Feeling of Stress : Not at all  Social Connections: Not on file    Tobacco Counseling Counseling given: Not Answered   Clinical Intake:  Pre-visit preparation completed: Yes  Pain : No/denies pain     Nutritional Status: BMI 25 -29 Overweight Nutritional Risks: None Diabetes: Yes  How often do you need to have someone help you when you read instructions, pamphlets, or other written materials from your doctor or pharmacy?: 1 - Never What is the last grade level you completed in school?: 10th grade  Diabetic? Yes Nutrition Risk Assessment:  Has the patient had any N/V/D within the last 2 months?  No  Does the patient have any non-healing wounds?  No  Has the patient had any unintentional weight loss or weight gain?  No   Diabetes:  Is the patient diabetic?  Yes  If diabetic, was a CBG obtained today?  No  Did the patient bring in their glucometer from home?  No  How often do you monitor your CBG's? daily.   Financial Strains and Diabetes Management:  Are you having any financial strains with the device, your supplies or your medication? No .  Does the patient want to be seen by Chronic Care Management for management of their diabetes?  No  Would the patient like to be referred to a Nutritionist or for Diabetic Management?  No   Diabetic Exams:  Diabetic Eye Exam: Completed 10/09/2019 Diabetic Foot Exam: Completed today  Interpreter Needed?: No  Information entered by :: NAllen LPN   Activities of Daily Living In your present state of health, do you have any  difficulty performing the following activities: 03/13/2020  Hearing? N  Vision? N  Difficulty concentrating or making decisions? N  Walking or climbing stairs? N  Dressing or bathing? N  Doing errands, shopping? N  Preparing Food and eating ? N  Using the Toilet? N  In the past six months, have you accidently leaked urine? N  Do you have problems with loss of bowel control? N  Managing your Medications? N  Managing your Finances? N  Housekeeping or managing your Housekeeping? N  Some recent data might be hidden    Patient Care Team: Glendale Chard, MD as PCP - General (Internal Medicine) Skeet Latch, MD as PCP - Cardiology (Cardiology)  Indicate any recent Medical  Services you may have received from other than [facility] providers in the past year (date may be approximate).     Assessment:   This is a routine wellness examination for [place].  Hearing/Vision screen  Hearing Screening   125Hz 250Hz 500Hz 1000Hz 2000Hz 3000Hz 4000Hz 6000Hz 8000Hz  Right ear:           Left ear:           Vision Screening Comments: Regular eye exams, Dr. Katy Fitch  Dietary issues and exercise activities discussed: Current Exercise Habits: The patient has a physically strenuous job, but has no regular exercise apart from work.  Goals    . Patient Stated     03/07/2019, wants to start walking 4-6 miles again    . Patient Stated     03/13/2020, no goals      Depression Screen PHQ 2/9 Scores 03/13/2020 03/13/2020 10/22/2019 06/19/2019 03/07/2019 03/07/2019 09/19/2018  PHQ - 2 Score 0 0 3 0 0 0 0  PHQ- 9 Score 0 - 6 3 0 0 -    Fall Risk Fall Risk  03/13/2020 03/07/2019 03/07/2019 09/19/2018 05/15/2018  Falls in the past year? 1 0 0 0 0  Comment tripped over feet - - - -  Number falls in past yr: 1 - - - -  Injury with Fall? 0 - - - -  Risk for fall due to : History of fall(s);Medication side effect Medication side effect - - -  Follow up Falls evaluation completed;Education provided;Falls prevention  discussed Falls evaluation completed;Education provided;Falls prevention discussed - - -    FALL RISK PREVENTION PERTAINING TO THE HOME:  Any stairs in or around the home? Yes  If so, are there any without handrails? No  Home free of loose throw rugs in walkways, pet beds, electrical cords, etc? Yes  Adequate lighting in your home to reduce risk of falls? Yes   ASSISTIVE DEVICES UTILIZED TO PREVENT FALLS:  Life alert? No  Use of a cane, walker or w/c? No  Grab bars in the bathroom? Yes  Shower chair or bench in shower? Yes  Elevated toilet seat or a handicapped toilet? No   TIMED UP AND GO:  Was the test performed? No .    Gait steady and fast without use of assistive device  Cognitive Function:     6CIT Screen 03/13/2020 03/07/2019  What Year? 0 points 0 points  What month? 0 points 0 points  What time? 0 points 0 points  Count back from 20 0 points 0 points  Months in reverse 0 points 4 points  Repeat phrase 6 points 8 points  Total Score 6 12    Immunizations Immunization History  Administered Date(s) Administered  . Fluad Quad(high Dose 65+) 12/25/2018  . Influenza, High Dose Seasonal PF 12/18/2019  . PFIZER SARS-COV-2 Vaccination 05/03/2019, 05/25/2019, 02/01/2020  . Pneumococcal Conjugate-13 12/19/2017  . Pneumococcal Polysaccharide-23 12/25/2018    TDAP status: Up to date  Flu Vaccine status: Up to date  Pneumococcal vaccine status: Up to date  Covid-19 vaccine status: Completed vaccines  Qualifies for Shingles Vaccine? Yes   Zostavax completed Yes   Shingrix Completed?: No.    Education has been provided regarding the importance of this vaccine. Patient has been advised to call insurance company to determine out of pocket expense if they have not yet received this vaccine. Advised may also receive vaccine at local pharmacy or Health Dept. Verbalized acceptance and understanding.  Screening Tests Health Maintenance  Topic Date Due  . HEMOGLOBIN A1C   04/24/2020  . OPHTHALMOLOGY EXAM  10/08/2020  . FOOT EXAM  03/13/2021  . Fecal DNA (Cologuard)  05/26/2022  . TETANUS/TDAP  01/10/2023  . INFLUENZA VACCINE  Completed  . DEXA SCAN  Completed  . COVID-19 Vaccine  Completed  . Hepatitis C Screening  Completed  . PNA vac Low Risk Adult  Completed    Health Maintenance  There are no preventive care reminders to display for this patient.  Colorectal cancer screening: Type of screening: Cologuard. Completed 05/27/2019. Repeat every 3 years  Mammogram status: patient to schedule  Bone Density status: Completed 04/04/2019. Results reflect: Bone density results: OSTEOPENIA. Repeat every 2 years.  Lung Cancer Screening: (Low Dose CT Chest recommended if Age 55-80 years, 30 pack-year currently smoking OR have quit w/in 15years.) does not qualify.   Lung Cancer Screening Referral: no  Additional Screening:  Hepatitis C Screening: does qualify; Completed 05/15/2018  Vision Screening: Recommended annual ophthalmology exams for early detection of glaucoma and other disorders of the eye. Is the patient up to date with their annual eye exam?  Yes  Who is the provider or what is the name of the office in which the patient attends annual eye exams? Dr. Katy Fitch If pt is not established with a provider, would they like to be referred to a provider to establish care? No .   Dental Screening: Recommended annual dental exams for proper oral hygiene  Community Resource Referral / Chronic Care Management: CRR required this visit?  No   CCM required this visit?  No      Plan:     I have personally reviewed and noted the following in the patient's chart:   . Medical and social history . Use of alcohol, tobacco or illicit drugs  . Current medications and supplements . Functional ability and status . Nutritional status . Physical activity . Advanced directives . List of other physicians . Hospitalizations, surgeries, and ER visits in previous  12 months . Vitals . Screenings to include cognitive, depression, and falls . Referrals and appointments  In addition, I have reviewed and discussed with patient certain preventive protocols, quality metrics, and best practice recommendations. A written personalized care plan for preventive services as well as general preventive health recommendations were provided to patient.     Kellie Simmering, LPN   [id number]   Nurse Notes:

## 2020-03-13 NOTE — Patient Instructions (Signed)
Virginia Russell , Thank you for taking time to come for your Medicare Wellness Visit. I appreciate your ongoing commitment to your health goals. Please review the following plan we discussed and let me know if I can assist you in the future.   Screening recommendations/referrals: Colonoscopy: cologuard 05/27/2019 Mammogram: patient to schedule Bone Density: completed 04/04/2019 Recommended yearly ophthalmology/optometry visit for glaucoma screening and checkup Recommended yearly dental visit for hygiene and checkup  Vaccinations: Influenza vaccine: completed 12/18/2019, due 11/03/2020 Pneumococcal vaccine: completed 12/25/2018 Tdap vaccine: completed 01/09/2013, due 01/10/2023 Shingles vaccine: discussed   Covid-19: 05/03/2019, 05/25/2019, 02/01/2020  Advanced directives: Please bring a copy of your POA (Power of Attorney) and/or Living Will to your next appointment.   Conditions/risks identified: none  Next appointment: 07/15/2020 at 10:00 Follow up in one year for your annual wellness visit    Preventive Care 75 Years and Older, Female Preventive care refers to lifestyle choices and visits with your health care provider that can promote health and wellness. What does preventive care include?  A yearly physical exam. This is also called an annual well check.  Dental exams once or twice a year.  Routine eye exams. Ask your health care provider how often you should have your eyes checked.  Personal lifestyle choices, including:  Daily care of your teeth and gums.  Regular physical activity.  Eating a healthy diet.  Avoiding tobacco and drug use.  Limiting alcohol use.  Practicing safe sex.  Taking low-dose aspirin every day.  Taking vitamin and mineral supplements as recommended by your health care provider. What happens during an annual well check? The services and screenings done by your health care provider during your annual well check will depend on your age, overall  health, lifestyle risk factors, and family history of disease. Counseling  Your health care provider may ask you questions about your:  Alcohol use.  Tobacco use.  Drug use.  Emotional well-being.  Home and relationship well-being.  Sexual activity.  Eating habits.  History of falls.  Memory and ability to understand (cognition).  Work and work Statistician.  Reproductive health. Screening  You may have the following tests or measurements:  Height, weight, and BMI.  Blood pressure.  Lipid and cholesterol levels. These may be checked every 5 years, or more frequently if you are over 30 years old.  Skin check.  Lung cancer screening. You may have this screening every year starting at age 9 if you have a 30-pack-year history of smoking and currently smoke or have quit within the past 15 years.  Fecal occult blood test (FOBT) of the stool. You may have this test every year starting at age 66.  Flexible sigmoidoscopy or colonoscopy. You may have a sigmoidoscopy every 5 years or a colonoscopy every 10 years starting at age 17.  Hepatitis C blood test.  Hepatitis B blood test.  Sexually transmitted disease (STD) testing.  Diabetes screening. This is done by checking your blood sugar (glucose) after you have not eaten for a while (fasting). You may have this done every 1-3 years.  Bone density scan. This is done to screen for osteoporosis. You may have this done starting at age 35.  Mammogram. This may be done every 1-2 years. Talk to your health care provider about how often you should have regular mammograms. Talk with your health care provider about your test results, treatment options, and if necessary, the need for more tests. Vaccines  Your health care provider may recommend certain vaccines,  such as:  Influenza vaccine. This is recommended every year.  Tetanus, diphtheria, and acellular pertussis (Tdap, Td) vaccine. You may need a Td booster every 10  years.  Zoster vaccine. You may need this after age 81.  Pneumococcal 13-valent conjugate (PCV13) vaccine. One dose is recommended after age 22.  Pneumococcal polysaccharide (PPSV23) vaccine. One dose is recommended after age 25. Talk to your health care provider about which screenings and vaccines you need and how often you need them. This information is not intended to replace advice given to you by your health care provider. Make sure you discuss any questions you have with your health care provider. Document Released: 04/18/2015 Document Revised: 12/10/2015 Document Reviewed: 01/21/2015 Elsevier Interactive Patient Education  2017 [place] Prevention in the Home Falls can cause injuries. They can happen to people of all ages. There are many things you can do to make your home safe and to help prevent falls. What can I do on the outside of my home?  Regularly fix the edges of walkways and driveways and fix any cracks.  Remove anything that might make you trip as you walk through a door, such as a raised step or threshold.  Trim any bushes or trees on the path to your home.  Use bright outdoor lighting.  Clear any walking paths of anything that might make someone trip, such as rocks or tools.  Regularly check to see if handrails are loose or broken. Make sure that both sides of any steps have handrails.  Any raised decks and porches should have guardrails on the edges.  Have any leaves, snow, or ice cleared regularly.  Use sand or salt on walking paths during winter.  Clean up any spills in your garage right away. This includes oil or grease spills. What can I do in the bathroom?  Use night lights.  Install grab bars by the toilet and in the tub and shower. Do not use towel bars as grab bars.  Use non-skid mats or decals in the tub or shower.  If you need to sit down in the shower, use a plastic, non-slip stool.  Keep the floor dry. Clean up any water that  spills on the floor as soon as it happens.  Remove soap buildup in the tub or shower regularly.  Attach bath mats securely with double-sided non-slip rug tape.  Do not have throw rugs and other things on the floor that can make you trip. What can I do in the bedroom?  Use night lights.  Make sure that you have a light by your bed that is easy to reach.  Do not use any sheets or blankets that are too big for your bed. They should not hang down onto the floor.  Have a firm chair that has side arms. You can use this for support while you get dressed.  Do not have throw rugs and other things on the floor that can make you trip. What can I do in the kitchen?  Clean up any spills right away.  Avoid walking on wet floors.  Keep items that you use a lot in easy-to-reach places.  If you need to reach something above you, use a strong step stool that has a grab bar.  Keep electrical cords out of the way.  Do not use floor polish or wax that makes floors slippery. If you must use wax, use non-skid floor wax.  Do not have throw rugs and other things on  the floor that can make you trip. What can I do with my stairs?  Do not leave any items on the stairs.  Make sure that there are handrails on both sides of the stairs and use them. Fix handrails that are broken or loose. Make sure that handrails are as long as the stairways.  Check any carpeting to make sure that it is firmly attached to the stairs. Fix any carpet that is loose or worn.  Avoid having throw rugs at the top or bottom of the stairs. If you do have throw rugs, attach them to the floor with carpet tape.  Make sure that you have a light switch at the top of the stairs and the bottom of the stairs. If you do not have them, ask someone to add them for you. What else can I do to help prevent falls?  Wear shoes that:  Do not have high heels.  Have rubber bottoms.  Are comfortable and fit you well.  Are closed at the  toe. Do not wear sandals.  If you use a stepladder:  Make sure that it is fully opened. Do not climb a closed stepladder.  Make sure that both sides of the stepladder are locked into place.  Ask someone to hold it for you, if possible.  Clearly mark and make sure that you can see:  Any grab bars or handrails.  First and last steps.  Where the edge of each step is.  Use tools that help you move around (mobility aids) if they are needed. These include:  Canes.  Walkers.  Scooters.  Crutches.  Turn on the lights when you go into a dark area. Replace any light bulbs as soon as they burn out.  Set up your furniture so you have a clear path. Avoid moving your furniture around.  If any of your floors are uneven, fix them.  If there are any pets around you, be aware of where they are.  Review your medicines with your doctor. Some medicines can make you feel dizzy. This can increase your chance of falling. Ask your doctor what other things that you can do to help prevent falls. This information is not intended to replace advice given to you by your health care provider. Make sure you discuss any questions you have with your health care provider. Document Released: 01/16/2009 Document Revised: 08/28/2015 Document Reviewed: 04/26/2014 Elsevier Interactive Patient Education  2017 [employer].

## 2020-03-14 LAB — HEMOGLOBIN A1C
Est. average glucose Bld gHb Est-mCnc: 166 mg/dL
Hgb A1c MFr Bld: 7.4 % — ABNORMAL HIGH (ref 4.8–5.6)

## 2020-03-14 LAB — BMP8+EGFR
BUN/Creatinine Ratio: 27 (ref 12–28)
BUN: 19 mg/dL (ref 8–27)
CO2: 26 mmol/L (ref 20–29)
Calcium: 9.7 mg/dL (ref 8.7–10.3)
Chloride: 101 mmol/L (ref 96–106)
Creatinine, Ser: 0.71 mg/dL (ref 0.57–1.00)
GFR calc Af Amer: 96 mL/min/{1.73_m2} (ref >59)
GFR calc non Af Amer: 84 mL/min/{1.73_m2} (ref >59)
Glucose: 124 mg/dL — ABNORMAL HIGH (ref 65–99)
Potassium: 3.9 mmol/L (ref 3.5–5.2)
Sodium: 142 mmol/L (ref 134–144)

## 2020-03-14 LAB — CBC
Hematocrit: 37.7 % (ref 34.0–46.6)
Hemoglobin: 12.8 g/dL (ref 11.1–15.9)
MCH: 32.2 pg (ref 26.6–33.0)
MCHC: 34 g/dL (ref 31.5–35.7)
MCV: 95 fL (ref 79–97)
Platelets: 171 10*3/uL (ref 150–450)
RBC: 3.98 x10E6/uL (ref 3.77–5.28)
RDW: 12.3 % (ref 11.7–15.4)
WBC: 6.4 10*3/uL (ref 3.4–10.8)

## 2020-03-14 LAB — VITAMIN B12: Vitamin B-12: 1188 pg/mL (ref 232–1245)

## 2020-03-26 ENCOUNTER — Other Ambulatory Visit: Payer: Self-pay | Admitting: Internal Medicine

## 2020-03-31 ENCOUNTER — Telehealth: Payer: Self-pay

## 2020-03-31 NOTE — Telephone Encounter (Signed)
I left the pt a message to call the office back. I called to remind the pt to let Dr. Allyne Gee know when she is running out of the Actos 30.  Dr. Allyne Gee wants to know if the pt agrees to  add Ozempic a once weekly injection if  and wants to make she the pt doesn't have any family history of thyroid cancer.

## 2020-04-02 ENCOUNTER — Other Ambulatory Visit: Payer: Self-pay | Admitting: Internal Medicine

## 2020-04-30 ENCOUNTER — Other Ambulatory Visit: Payer: Self-pay | Admitting: Internal Medicine

## 2020-04-30 DIAGNOSIS — Z1231 Encounter for screening mammogram for malignant neoplasm of breast: Secondary | ICD-10-CM

## 2020-05-07 ENCOUNTER — Other Ambulatory Visit: Payer: Self-pay | Admitting: Nurse Practitioner

## 2020-05-07 ENCOUNTER — Encounter: Payer: Self-pay | Admitting: Nurse Practitioner

## 2020-05-07 ENCOUNTER — Ambulatory Visit (INDEPENDENT_AMBULATORY_CARE_PROVIDER_SITE_OTHER): Payer: Medicare HMO | Admitting: Nurse Practitioner

## 2020-05-07 ENCOUNTER — Ambulatory Visit: Payer: Self-pay | Admitting: Nurse Practitioner

## 2020-05-07 ENCOUNTER — Other Ambulatory Visit: Payer: Self-pay

## 2020-05-07 VITALS — BP 120/82 | HR 84 | Temp 97.9°F | Ht 61.0 in | Wt 158.6 lb

## 2020-05-07 DIAGNOSIS — R21 Rash and other nonspecific skin eruption: Secondary | ICD-10-CM

## 2020-05-07 DIAGNOSIS — N644 Mastodynia: Secondary | ICD-10-CM

## 2020-05-07 DIAGNOSIS — Z803 Family history of malignant neoplasm of breast: Secondary | ICD-10-CM

## 2020-05-07 MED ORDER — VALACYCLOVIR HCL 500 MG PO TABS: 500.0000 mg | ORAL_TABLET | Freq: Three times a day (TID) | ORAL | 0 refills | Status: AC

## 2020-05-07 NOTE — Patient Instructions (Signed)

## 2020-05-07 NOTE — Progress Notes (Signed)
I,Dashonda Bonneau [employer] as a Education administrator for [employer], FNP.,have documented all relevant documentation on the behalf of Minette Brine, FNP,as directed by  Minette Brine, FNP while in the presence of Minette Brine, [place]. This visit occurred during the SARS-CoV-2 public health emergency.  Safety protocols were in place, including screening questions prior to the visit, additional usage of staff PPE, and extensive cleaning of exam room while observing appropriate contact time as indicated for disinfecting solutions.  Subjective:     Patient ID: Virginia Russell , female    DOB: 09-Mar-1946 , 75 y.o.   MRN: [id number]   Chief Complaint  Patient presents with  . Breast Pain    Patient stated her left breast has been really sore and she went to schedule for a mammogram but they want her to have a diagnostic mammogram due to having breast cancer in her family.     HPI  Patient presents today for pain to the left breast.  She has had pain for two weeks. Denies lifting or pulling.  She would like to have a diagnostic mammogram done.  Her mother had breast cancer.     Past Medical History:  Diagnosis Date  . Anxiety   . Aortic stenosis, mild 02/11/2020   Mean gradient 9 mmHg on 11/2019  . Carotid stenosis, bilateral 02/11/2020  . Diabetes mellitus without complication ([place])   . High cholesterol   . HTN (hypertension)   . Hyperlipidemia LDL goal <70 02/11/2020     Family History  Problem Relation Age of Onset  . Breast cancer Mother   . Heart disease Mother   . Heart attack Mother   . Stroke Father   . Heart attack Father   . Heart attack Daughter   . Heart attack Brother   . Heart attack Maternal Grandmother   . Heart attack Paternal Grandmother      Current Outpatient Medications:  .  Accu-Chek Softclix Lancets lancets, Use as instructed to check blood sugars 1 time per day dx: e11.22, Disp: 150 each, Rfl: 3 .  Alcohol Swabs (B-D SINGLE USE SWABS BUTTERFLY) PADS, 100 Pieces by  Does not apply route as needed., Disp: 100 each, Rfl: 3 .  Alogliptin Benzoate (NESINA) 25 MG TABS, Take 1 tablet by mouth daily., Disp: 90 tablet, Rfl: 1 .  aspirin 81 MG tablet, Take 81 mg by mouth daily., Disp: , Rfl:  .  atorvastatin (LIPITOR) 40 MG tablet, TAKE 1 TABLET EVERY NIGHT, Disp: 90 tablet, Rfl: 1 .  Blood Glucose Monitoring Suppl (ACCU-CHEK AVIVA PLUS) w/Device KIT, Use as directed to check blood sugars 1 time per day dx: e11.22, Disp: 1 kit, Rfl: 1 .  celecoxib (CELEBREX) 200 MG capsule, Take 1 capsule (200 mg total) by mouth 2 (two) times daily., Disp: 90 capsule, Rfl: 2 .  glucose blood (ACCU-CHEK AVIVA PLUS) test strip, Use as instructed to check blood sugars 1 time per day dx: e11.22, Disp: 150 each, Rfl: 3 .  losartan (COZAAR) 50 MG tablet, TAKE 1 TABLET EVERY DAY, Disp: 90 tablet, Rfl: 1 .  metFORMIN (GLUCOPHAGE-XR) 750 MG 24 hr tablet, TAKE 2 TABLETS EVERY EVENING, Disp: 180 tablet, Rfl: 1 .  pioglitazone (ACTOS) 30 MG tablet, TAKE 1 TABLET EVERY DAY, Disp: 90 tablet, Rfl: 1 .  venlafaxine (EFFEXOR) 37.5 MG tablet, TAKE 1 TABLET EVERY DAY, Disp: 90 tablet, Rfl: 1   Allergies  Allergen Reactions  . Codeine      Review of Systems  Constitutional: Negative.  HENT: Negative.   Eyes: Negative.   Respiratory: Negative.   Cardiovascular: Negative.   Gastrointestinal: Negative.   Endocrine: Negative.   Genitourinary: Negative.        Left breast pain  Musculoskeletal: Negative.   Skin: Negative.   Neurological: Negative.   Hematological: Negative.   Psychiatric/Behavioral: Negative.      Today's Vitals   05/07/20 1550  BP: 120/82  Pulse: 84  Temp: 97.9 F (36.6 C)  TempSrc: Oral  Weight: 158 lb 9.6 oz (71.9 kg)  Height: '5\' 1"'  (1.549 m)  PainSc: 8   PainLoc: Breast   Body mass index is 29.97 kg/m.   Objective:  Physical Exam Chest:     Chest wall: No mass or tenderness.  Breasts:     Right: No tenderness, axillary adenopathy or supraclavicular  adenopathy.     Left: Tenderness present. No axillary adenopathy or supraclavicular adenopathy.        Comments: Tenderness to left lateral outer breast at 3 oclock Lymphadenopathy:     Upper Body:     Right upper body: No supraclavicular, axillary or pectoral adenopathy.     Left upper body: No supraclavicular, axillary or pectoral adenopathy.  Skin:    Capillary Refill: Capillary refill takes less than 2 seconds.  Neurological:     General: No focal deficit present.     Mental Status: She is oriented to person, place, and time.     Cranial Nerves: No cranial nerve deficit.  Psychiatric:        Mood and Affect: Mood normal.        Behavior: Behavior normal.        Thought Content: Thought content normal.        Judgment: Judgment normal.         Assessment And Plan:     1. Breast pain  Tenderness at 2 o'clock  No nodules palpated  Will send for diagnostic mammogram - [facility] Digital Diagnostic Unilat L; Future  2. Rash and nonspecific skin eruption - valACYclovir (VALTREX) 500 MG tablet; Take 1 tablet (500 mg total) by mouth 3 (three) times daily for 5 days.  Dispense: 15 tablet; Refill: 0  3. Family history of breast cancer     Patient was given opportunity to ask questions. Patient verbalized understanding of the plan and was able to repeat key elements of the plan. All questions were answered to their satisfaction.  Minette Brine, FNP   I, Minette Brine, FNP, have reviewed all documentation for this visit. The documentation on 05/07/20 for the exam, diagnosis, procedures, and orders are all accurate and complete.   THE PATIENT IS ENCOURAGED TO PRACTICE SOCIAL DISTANCING DUE TO THE COVID-19 PANDEMIC.

## 2020-05-14 ENCOUNTER — Ambulatory Visit: Payer: Medicare HMO | Admitting: Cardiovascular Disease

## 2020-05-27 ENCOUNTER — Ambulatory Visit
Admission: RE | Admit: 2020-05-27 | Discharge: 2020-05-27 | Disposition: A | Discharge status: Home / Self Care | Payer: Medicare HMO | Source: Ambulatory Visit | Attending: Nurse Practitioner | Admitting: Nurse Practitioner

## 2020-05-27 ENCOUNTER — Other Ambulatory Visit: Payer: Self-pay

## 2020-05-27 DIAGNOSIS — N644 Mastodynia: Secondary | ICD-10-CM

## 2020-05-27 DIAGNOSIS — R922 Inconclusive mammogram: Secondary | ICD-10-CM | POA: Diagnosis not present

## 2020-06-16 ENCOUNTER — Telehealth: Payer: Self-pay

## 2020-06-16 ENCOUNTER — Ambulatory Visit: Payer: Medicare HMO

## 2020-06-16 NOTE — Telephone Encounter (Signed)
The pt said that she tried to get her Nesina refill and they said that her birth date wasn't right, her signature wasn't on there and that the dosing wasn't on the paper work. The patient assistance program is resending so that it can be corrected.

## 2020-06-18 ENCOUNTER — Other Ambulatory Visit: Payer: Medicare HMO

## 2020-06-23 ENCOUNTER — Telehealth: Payer: Self-pay | Admitting: Cardiovascular Disease

## 2020-06-23 ENCOUNTER — Telehealth: Payer: Self-pay

## 2020-06-23 NOTE — Telephone Encounter (Signed)
Pt c/o of Chest Pain: STAT if CP now or developed within 24 hours  1. Are you having CP right now? No  2. Are you experiencing any other symptoms (ex. SOB, nausea, vomiting, sweating)? Per daughter, not that she is aware of.   3. How long have you been experiencing CP? Started last night. Daughter spoke with dad last night and states that the was in bed last night around 10pm and jumped up because she could not get a good breath and felt like chest pressure.   4. Is your CP continuous or coming and going? Coming and going  5. Have you taken Nitroglycerin? no ?  Daughter would like mother to come in for an EKG and possibly blood work.

## 2020-06-23 NOTE — Telephone Encounter (Signed)
Pt left message stating that this was the fourth time she was having complications with paperwork being faxed over to [facility]. Called pt letting her know it was being worked on by pharmacist and that we would call her back as soon as possible giving her an update.

## 2020-06-23 NOTE — Telephone Encounter (Signed)
Returned call to patients daughter Virginia Russell (okay per [facility]) who states that patient had an episode of chest pain/pressure last night. Patients daughter states that per patients husband, patient jumped up out of bed with shortness of breath and experiencing chest pain/tightness/pressure. Patients daughter reports that it took a awhile for patient to relax after this episode. Per patients daughter, patient had to sleep on the couch last night due to this. Patients daughter unable to provide recent BP/HR at this time. Patients daughter reports that the patient has had no chest pain/symptoms since this occurrence. Patient's daughter and husband are concerned and would like patient to be seen by cardiology as soon as possible.  Advised patients daughter that if the chest pain occurs again or patient develops other symptoms then she should take patient to the ER. Patients daughter verbalized understanding.   Advised patient's daughter that there were no appointments with Dr. Oval Linsey or APP this week. Made patient an appointment to see DOD( Dr. Audie Box) on Thursday 3/24 at 3:20pm. Patients daughter verbalized understanding of all instructions.   Will forward to MD to make aware.

## 2020-06-25 NOTE — Progress Notes (Signed)
Cardiology Office Note:   Date:  06/26/2020  NAME:  Virginia Russell [facility]    MRN: [id number] DOB:  1945-08-21   PCP:  Glendale Chard, MD  Cardiologist:  Skeet Latch, MD   Referring MD: Glendale Chard, MD   Chief Complaint  Patient presents with  . Chest Pain   History of Present Illness:   Virginia Russell is a 75 y.o. female with a hx of DM, HTN, HLD who presents for follow-up of chest pain. She was evaluated by Dr. Oval Linsey for chest pain last year. She had a normal stress test.  She reports she woke up from sleep on Sunday night with what she felt like a weight on her chest.  Associated symptoms were shortness of breath.  Symptoms were constant for up to 90 minutes.  She reports that she slept in the recliner after this episode.  She reports that she is had no further episodes of chest pain.  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  She may get short of breath but has no chest pain.  Her EKG in the office demonstrates normal sinus rhythm with no acute ischemic changes or evidence of infarction.  I did inquire about sleep apnea.  She reports she does not snore.  There is no history of sleep apnea.  She did not eat any spicy foods.  She did not drink alcohol.  She is a never smoker and does not drink alcohol or use drugs.  All of her cardiovascular disease risk factors appear to be well controlled.  It is reassuring that her symptoms have resolved.  We discussed coronary CTA versus doing nothing.  They have opted for nothing.  I think this is reasonable given her normal stress test recently.  Her cardiovascular examination is normal as well.  Problem List 1. Diabetes -A1c 7.4 2. HTN 3. HLD -T chol 163, HDL 66, LDL 81, TG 88 4. Aortic stenosis, mild  Past Medical History: Past Medical History:  Diagnosis Date  . Anxiety   . Aortic stenosis, mild 02/11/2020   Mean gradient 9 mmHg on 11/2019  . Carotid stenosis, bilateral 02/11/2020  . Diabetes mellitus  without complication ([place])   . High cholesterol   . HTN (hypertension)   . Hyperlipidemia LDL goal <70 02/11/2020    Past Surgical History: Past Surgical History:  Procedure Laterality Date  . ABDOMINAL HYSTERECTOMY     ABDOMINAL HYSTERECTOMY  . BACK SURGERY    . BREAST BIOPSY Left   . NECK SURGERY    . SHOULDER SURGERY     X2    Current Medications: Current Meds  Medication Sig  . Accu-Chek Softclix Lancets lancets Use as instructed to check blood sugars 1 time per day dx: e11.22  . Alcohol Swabs (B-D SINGLE USE SWABS BUTTERFLY) PADS 100 Pieces by Does not apply route as needed.  . Alogliptin Benzoate (NESINA) 25 MG TABS Take 1 tablet by mouth daily.  Marland Kitchen aspirin 81 MG tablet Take 81 mg by mouth daily.  Marland Kitchen atorvastatin (LIPITOR) 40 MG tablet TAKE 1 TABLET EVERY NIGHT  . Blood Glucose Monitoring Suppl (ACCU-CHEK AVIVA PLUS) w/Device KIT Use as directed to check blood sugars 1 time per day dx: e11.22  . celecoxib (CELEBREX) 200 MG capsule Take 1 capsule (200 mg total) by mouth 2 (two) times daily.  Marland Kitchen glucose blood (ACCU-CHEK AVIVA PLUS) test strip Use as instructed to check blood sugars 1 time per day dx: e11.22  .  losartan (COZAAR) 50 MG tablet TAKE 1 TABLET EVERY DAY  . metFORMIN (GLUCOPHAGE-XR) 750 MG 24 hr tablet TAKE 2 TABLETS EVERY EVENING  . pioglitazone (ACTOS) 30 MG tablet TAKE 1 TABLET EVERY DAY  . venlafaxine (EFFEXOR) 37.5 MG tablet TAKE 1 TABLET EVERY DAY     Allergies:    Codeine   Social History: Social History   Socioeconomic History  . Marital status: Married    Spouse name: Not on file  . Number of children: 3  . Years of education: Not on file  . Highest education level: Not on file  Occupational History  . Occupation: Electrical engineer  Tobacco Use  . Smoking status: Never Smoker  . Smokeless tobacco: Never Used  Vaping Use  . Vaping Use: Never used  Substance and Sexual Activity  . Alcohol use: No  . Drug use: Never  . Sexual activity: Not  Currently  Other Topics Concern  . Not on file  Social History Narrative  . Not on file   Social Determinants of Health   Financial Resource Strain: Low Risk   . Difficulty of Paying Living Expenses: Not hard at all  Food Insecurity: No Food Insecurity  . Worried About Charity fundraiser in the Last Year: Never true  . Ran Out of Food in the Last Year: Never true  Transportation Needs: No Transportation Needs  . Lack of Transportation (Medical): No  . Lack of Transportation (Non-Medical): No  Physical Activity: Inactive  . Days of Exercise per Week: 0 days  . Minutes of Exercise per Session: 0 min  Stress: No Stress Concern Present  . Feeling of Stress : Not at all  Social Connections: Not on file     Family History: The patient's family history includes Breast cancer in her mother; Heart attack in her brother, daughter, father, maternal grandmother, mother, and paternal grandmother; Heart disease in her mother; Stroke in her father.  ROS:   All other ROS reviewed and negative. Pertinent positives noted in the HPI.     EKGs/Labs/Other Studies Reviewed:   The following studies were personally reviewed by me today:  EKG:  EKG is ordered today.  The ekg ordered today demonstrates normal sinus rhythm heart rate 79, no acute ischemic changes or evidence of infarction is okay has not come back to this was Sunday, and was personally reviewed by me.   NM Stress 02/26/2020   Nuclear stress EF: 60%.  The left ventricular ejection fraction is normal (55-65%).  There was no ST segment deviation noted during stress.  Defect 1: There is a small defect of mild severity present in the apex location.  This is a low risk study.   No evidence of ischemia, though there is a small fixed defect at the apex. There is normal wall motion in this area, so defect most consistent with artifact. Occasional PVCs seen during test.  TTE 12/04/2019 Left ventricle cavity is normal in size and wall  thickness. Normal global  wall motion. Normal LV systolic function with visual EF 55-60%. Normal  diastolic filling pattern.  Left atrial cavity is mildly dilated.  Trileaflet aortic valve with moderate calcification. Mild aortic stenosis.  Vmax 2.0 m/sec, mean PG 9 mmHg, AVA 1.2 cm2. Mild (Grade I) aortic  regurgitation.  Mild (Grade I) mitral regurgitation.  Mild tricuspid regurgitation. Estimated pulmonary artery systolic pressure  31 mmHg.   Recent Labs: 10/23/2019: ALT 8 03/13/2020: BUN 19; Creatinine, Ser 0.71; Hemoglobin 12.8; Platelets 171; Potassium 3.9;  Sodium 142   Recent Lipid Panel    Component Value Date/Time   CHOL 163 10/23/2019 0905   TRIG 88 10/23/2019 0905   HDL 66 10/23/2019 0905   CHOLHDL 2.5 10/23/2019 0905   LDLCALC 81 10/23/2019 0905    Physical Exam:   VS:  BP 138/68 (BP Location: Left Arm, Patient Position: Sitting, Cuff Size: Normal)   Pulse 79   Ht 5' 1.75" (1.568 m)   Wt 158 lb 6.4 oz (71.8 kg)   SpO2 93%   BMI 29.21 kg/m    Wt Readings from Last 3 Encounters:  06/26/20 158 lb 6.4 oz (71.8 kg)  05/07/20 158 lb 9.6 oz (71.9 kg)  03/13/20 153 lb 12.8 oz (69.8 kg)    General: Well nourished, well developed, in no acute distress Head: Atraumatic, normal size  Eyes: PEERLA, EOMI  Neck: Supple, no JVD Endocrine: No thryomegaly Cardiac: Normal S1, S2; RRR; no murmurs, rubs, or gallops Lungs: Clear to auscultation bilaterally, no wheezing, rhonchi or rales  Abd: Soft, nontender, no hepatomegaly  Ext: No edema, pulses 2+ Musculoskeletal: No deformities, BUE and BLE strength normal and equal Skin: Warm and dry, no rashes   Neuro: Alert and oriented to person, place, time, and situation, CNII-XII grossly intact, no focal deficits  Psych: Normal mood and affect   ASSESSMENT:   Virginia Russell is a 75 y.o. female who presents for the following: 1. Chest pain, unspecified type     PLAN:   1. Chest pain, unspecified type -Atypical chest  pain.  Occurred at night.  Woke her up from sleep.  Symptoms have resolved.  They are not exertional.  They are not alleviated by rest.  They have not recurred again.  I suspect this is either acid reflux related to possible sleep apnea.  She does not report any history of sleep apnea but this may be present.  She does snore.  This is only occurred one time.  Her EKG is normal.  She is had a recent stress test that was normal.  She has no features concerning for ongoing symptoms or exertional chest pain.  We discussed pursuing coronary CTA versus watchful waiting.  This time they would like to hold on any further testing.  I think this is reasonable as her symptoms have resolved.  They will let us know if her symptoms do recur.  She will follow with Dr. Oval Linsey.  Disposition: Return if symptoms worsen or fail to improve.  Medication Adjustments/Labs and Tests Ordered: Current medicines are reviewed at length with the patient today.  Concerns regarding medicines are outlined above.  Orders Placed This Encounter  Procedures  . EKG 12-Lead   No orders of the defined types were placed in this encounter.   Patient Instructions  Medication Instructions:  The current medical regimen is effective;  continue present plan and medications.  *If you need a refill on your cardiac medications before your next appointment, please call your pharmacy*   Follow-Up: At [facility], you and your health needs are our priority.  As part of our continuing mission to provide you with exceptional heart care, we have created designated Provider Care Teams.  These Care Teams include your primary Cardiologist (physician) and Advanced Practice Providers (APPs -  Physician Assistants and Nurse Practitioners) who all work together to provide you with the care you need, when you need it.  We recommend signing up for the patient portal called "MyChart".  Sign up information is provided on  this After Visit Summary.   MyChart is used to connect with patients for Virtual Visits (Telemedicine).  Patients are able to view lab/test results, encounter notes, upcoming appointments, etc.  Non-urgent messages can be sent to your provider as well.   To learn more about what you can do with MyChart, go to [email or web address].    Your next appointment:   As needed  The format for your next appointment:   In Person  Provider:   Skeet Latch, MD        Time Spent with Patient: I have spent a total of 25 minutes with patient reviewing hospital notes, telemetry, EKGs, labs and examining the patient as well as establishing an assessment and plan that was discussed with the patient.  > 50% of time was spent in direct patient care.  Signed, Addison Naegeli. Audie Box, MD, [place]  [address] [place], [place] [address] [phone]  06/26/2020 3:49 PM

## 2020-06-26 ENCOUNTER — Encounter: Payer: Self-pay | Admitting: Cardiovascular Disease

## 2020-06-26 ENCOUNTER — Other Ambulatory Visit: Payer: Self-pay

## 2020-06-26 ENCOUNTER — Ambulatory Visit: Payer: Medicare HMO | Admitting: Cardiovascular Disease

## 2020-06-26 VITALS — BP 138/68 | HR 79 | Ht 61.75 in | Wt 158.4 lb

## 2020-06-26 DIAGNOSIS — R079 Chest pain, unspecified: Secondary | ICD-10-CM

## 2020-06-26 NOTE — Patient Instructions (Signed)
Medication Instructions:  The current medical regimen is effective;  continue present plan and medications.  *If you need a refill on your cardiac medications before your next appointment, please call your pharmacy*   Follow-Up: At [facility], you and your health needs are our priority.  As part of our continuing mission to provide you with exceptional heart care, we have created designated Provider Care Teams.  These Care Teams include your primary Cardiologist (physician) and Advanced Practice Providers (APPs -  Physician Assistants and Nurse Practitioners) who all work together to provide you with the care you need, when you need it.  We recommend signing up for the patient portal called "MyChart".  Sign up information is provided on this After Visit Summary.  MyChart is used to connect with patients for Virtual Visits (Telemedicine).  Patients are able to view lab/test results, encounter notes, upcoming appointments, etc.  Non-urgent messages can be sent to your provider as well.   To learn more about what you can do with MyChart, go to [email or web address].    Your next appointment:   As needed  The format for your next appointment:   In Person  Provider:   Skeet Latch, MD

## 2020-07-01 ENCOUNTER — Other Ambulatory Visit: Payer: Self-pay | Admitting: Internal Medicine

## 2020-07-01 ENCOUNTER — Telehealth: Payer: Self-pay

## 2020-07-01 NOTE — Chronic Care Management (AMB) (Addendum)
Chronic Care Management Pharmacy Assistant   Name: Virginia Russell [facility]  MRN: [id number] DOB: 1945/07/03  Reason for Encounter: Patient Assistance Coordination   07/01/2020- Patient assistance form filled out for Nesina 25 mg with Virginia Russell Patient assistance program. Received notification Virginia Russell, CPP that Virginia Russell was missing information on patients application but I did not recall doing a application for this medication at all. Daryll Brod, spoke with [name], they have patient's form but the physician page is missing. Forms must have been sent without the PCP signing. I have printed form for Dr Baird Cancer to sign and I will fax to [facility].  Called patient to inform status. Patient is not sure what happened, she brought forms to office once she could fill out her portion, patient aware I have physician form ready for PCP to sign, she will be back in the office tomorrow. Patient has been out of Nesina for 2 months now, she informed that Dr Baird Cancer would have samples to give, patient states her blood sugars have ranged from 150 and lower, she doesn't want them to get any higher. Patient aware I will contact her tomorrow regarding form being faxed and samples available. She would like for me to call her cell phone and/or leave a message on home phone if not available.   07/02/2020- Dr Baird Cancer signed prescription page for Virginia Russell, she is also aware patient is requesting samples. Dr. Baird Cancer will follow up with which medications was given in the past under the samples of her Nessina due to them not having Nessina in the office as samples. Forms faxed to [facility]. Awaiting response from PCP on medication samples patient may pick up. Virginia Russell, CPP notified.  Medications: Outpatient Encounter Medications as of 07/01/2020  Medication Sig   Accu-Chek Softclix Lancets lancets Use as instructed to check blood sugars 1 time per day dx: e11.22   Alcohol Swabs (B-D SINGLE USE SWABS BUTTERFLY) PADS 100  Pieces by Does not apply route as needed.   Alogliptin Benzoate (NESINA) 25 MG TABS Take 1 tablet by mouth daily.   aspirin 81 MG tablet Take 81 mg by mouth daily.   atorvastatin (LIPITOR) 40 MG tablet TAKE 1 TABLET EVERY NIGHT   Blood Glucose Monitoring Suppl (ACCU-CHEK AVIVA PLUS) w/Device KIT Use as directed to check blood sugars 1 time per day dx: e11.22   celecoxib (CELEBREX) 200 MG capsule Take 1 capsule (200 mg total) by mouth 2 (two) times daily.   glucose blood (ACCU-CHEK AVIVA PLUS) test strip Use as instructed to check blood sugars 1 time per day dx: e11.22   losartan (COZAAR) 50 MG tablet TAKE 1 TABLET EVERY DAY   metFORMIN (GLUCOPHAGE-XR) 750 MG 24 hr tablet TAKE 2 TABLETS EVERY EVENING   pioglitazone (ACTOS) 30 MG tablet TAKE 1 TABLET EVERY DAY   venlafaxine (EFFEXOR) 37.5 MG tablet TAKE 1 TABLET EVERY DAY   No facility-administered encounter medications on file as of 07/01/2020.    Star Rating Drugs: Atorvastatin 40 mg- Last filled 04/03/2020 for 90 day supply at [facility]. Losartan 50 mg- Last filled 04/06/2020 for 90 day supply at [facility]. Piogltazone 30 mg- Last filled 04/06/2020 for 90 day supply at [facility]. Metformin 750 mg- Last filled 04/06/2020 for 90 ay supply at [facility].  07/15/2020- Patient had a visit with Dr Baird Cancer today, she called after visit needing fax number to send over her tax forms to attach with patient assistance application. Patient left a message. Called patient back, no answer,  left message of fax number to Dr Baird Cancer office at [facility], to put attention to [facility].  SIG: Virginia Russell, [place] Pharmacist Assistant [phone]  I have reviewed the care management and care coordination activities outlined in this encounter and I am certifying that I agree with the content of this note. No further action required.  Mayford Knife, [facility] 07/18/20 1:08 PM

## 2020-07-01 NOTE — Telephone Encounter (Signed)
Called patient to let her know that we will be following up on her Bernita Buffy patient assistance application, and that CPA was told the assistance form is missing information. Requested that CPA contact patient as well. I left a voicemail on the patients phone explaining that CPA Felicity Coyer would be speaking with her.   [name], PharmD Clinical Pharmacist [facility] [phone]

## 2020-07-03 ENCOUNTER — Telehealth: Payer: Self-pay

## 2020-07-03 NOTE — Telephone Encounter (Signed)
The pt was notified that the pharmacist [name] spoke with Dr. Baird Cancer about the pt being switched to a different medication.

## 2020-07-07 ENCOUNTER — Telehealth: Payer: Self-pay

## 2020-07-07 NOTE — Telephone Encounter (Signed)
I left the pt a message that I was calling her back to get the name of the person who's call she is returning, I don't see a note in the pt's chart that anyone from the office called her today.

## 2020-07-07 NOTE — Telephone Encounter (Signed)
Samples left of januvia up front for pick up

## 2020-07-08 NOTE — Telephone Encounter (Signed)
Reviewed faxes received by Bernita Buffy on 07/03/2020. States patients application is still not complete. Per representative Randall Hiss, patients submission is missing section 6, reviewed entire application and confirmed 5 submissions were done. Patient was given samples of medication of Januvia in office will confirm patient should continue to be on Nesina, and will ask patient to come and sign paper work Total Time: 11 minutes  [name], PharmD Clinical Pharmacist [facility] [phone]

## 2020-07-15 ENCOUNTER — Ambulatory Visit (INDEPENDENT_AMBULATORY_CARE_PROVIDER_SITE_OTHER): Payer: Medicare HMO | Admitting: Internal Medicine

## 2020-07-15 ENCOUNTER — Encounter: Payer: Self-pay | Admitting: Internal Medicine

## 2020-07-15 ENCOUNTER — Other Ambulatory Visit: Payer: Self-pay

## 2020-07-15 VITALS — BP 124/72 | HR 86 | Temp 97.9°F | Ht 61.75 in | Wt 154.4 lb

## 2020-07-15 DIAGNOSIS — E1165 Type 2 diabetes mellitus with hyperglycemia: Secondary | ICD-10-CM | POA: Diagnosis not present

## 2020-07-15 DIAGNOSIS — E7800 Pure hypercholesterolemia, unspecified: Secondary | ICD-10-CM | POA: Diagnosis not present

## 2020-07-15 DIAGNOSIS — I10 Essential (primary) hypertension: Secondary | ICD-10-CM

## 2020-07-15 DIAGNOSIS — G40909 Epilepsy, unspecified, not intractable, without status epilepticus: Secondary | ICD-10-CM | POA: Diagnosis not present

## 2020-07-15 LAB — LIPID PANEL
Chol/HDL Ratio: 2.5 ratio (ref 0.0–4.4)
Cholesterol, Total: 157 mg/dL (ref 100–199)
HDL: 63 mg/dL (ref >39)
LDL Chol Calc (NIH): 80 mg/dL (ref 0–99)
Triglycerides: 75 mg/dL (ref 0–149)
VLDL Cholesterol Cal: 14 mg/dL (ref 5–40)

## 2020-07-15 LAB — CMP14+EGFR
ALT: 10 IU/L (ref 0–32)
AST: 15 IU/L (ref 0–40)
Albumin/Globulin Ratio: 1.8 (ref 1.2–2.2)
Albumin: 4.5 g/dL (ref 3.7–4.7)
Alkaline Phosphatase: 68 IU/L (ref 44–121)
BUN/Creatinine Ratio: 32 — ABNORMAL HIGH (ref 12–28)
BUN: 22 mg/dL (ref 8–27)
Bilirubin Total: 1 mg/dL (ref 0.0–1.2)
CO2: 25 mmol/L (ref 20–29)
Calcium: 9.8 mg/dL (ref 8.7–10.3)
Chloride: 98 mmol/L (ref 96–106)
Creatinine, Ser: 0.69 mg/dL (ref 0.57–1.00)
Globulin, Total: 2.5 g/dL (ref 1.5–4.5)
Glucose: 148 mg/dL — ABNORMAL HIGH (ref 65–99)
Potassium: 4.6 mmol/L (ref 3.5–5.2)
Sodium: 139 mmol/L (ref 134–144)
Total Protein: 7 g/dL (ref 6.0–8.5)
eGFR: 90 mL/min/{1.73_m2} (ref >59)

## 2020-07-15 LAB — HEMOGLOBIN A1C
Est. average glucose Bld gHb Est-mCnc: 171 mg/dL
Hgb A1c MFr Bld: 7.6 % — ABNORMAL HIGH (ref 4.8–5.6)

## 2020-07-15 NOTE — Progress Notes (Signed)
I,[name],acting as a Education administrator for Maximino Greenland, MD.,have documented all relevant documentation on the behalf of Maximino Greenland, MD,as directed by  Maximino Greenland, MD while in the presence of Maximino Greenland, MD.  This visit occurred during the SARS-CoV-2 public health emergency.  Safety protocols were in place, including screening questions prior to the visit, additional usage of staff PPE, and extensive cleaning of exam room while observing appropriate contact time as indicated for disinfecting solutions.  Subjective:     Patient ID: Virginia Russell , female    DOB: September 19, 1945 , 75 y.o.   MRN: [id number]   Chief Complaint  Patient presents with  . Diabetes  . Hypertension    HPI  The patient is here today for a follow-up on her diabetes.  The patient was recently started on Januvia 100 mg daily. She had to switch to Januvia b/c she is waiting for her Nesina from [facility] to come in. She has not had any issues with Januvia.   Diabetes She presents for her follow-up diabetic visit. She has type 2 diabetes mellitus. No MedicAlert identification noted. Her disease course has been stable. There are no hypoglycemic associated symptoms. Pertinent negatives for diabetes include no blurred vision and no chest pain. There are no hypoglycemic complications. Symptoms are stable. Risk factors for coronary artery disease include dyslipidemia, diabetes mellitus, hypertension, post-menopausal and stress. She is compliant with treatment all of the time. She is following a diabetic diet. She participates in exercise intermittently. Her breakfast blood glucose is taken between 7-8 am. Her breakfast blood glucose range is generally 90-110 mg/dl. An ACE inhibitor/angiotensin II receptor blocker is being taken. Eye exam is current.  Hypertension This is a chronic problem. The current episode started more than 1 year ago. The problem has been gradually improving since onset. The problem is  controlled. Pertinent negatives include no blurred vision, chest pain, palpitations or shortness of breath. Past treatments include ACE inhibitors and diuretics. The current treatment provides moderate improvement.     Past Medical History:  Diagnosis Date  . Anxiety   . Aortic stenosis, mild 02/11/2020   Mean gradient 9 mmHg on 11/2019  . Carotid stenosis, bilateral 02/11/2020  . Diabetes mellitus without complication ([place])   . High cholesterol   . HTN (hypertension)   . Hyperlipidemia LDL goal <70 02/11/2020     Family History  Problem Relation Age of Onset  . Breast cancer Mother   . Heart disease Mother   . Heart attack Mother   . Stroke Father   . Heart attack Father   . Heart attack Daughter   . Heart attack Brother   . Heart attack Maternal Grandmother   . Heart attack Paternal Grandmother      Current Outpatient Medications:  .  Accu-Chek Softclix Lancets lancets, Use as instructed to check blood sugars 1 time per day dx: e11.22, Disp: 150 each, Rfl: 3 .  Alcohol Swabs (B-D SINGLE USE SWABS BUTTERFLY) PADS, 100 Pieces by Does not apply route as needed., Disp: 100 each, Rfl: 3 .  aspirin 81 MG tablet, Take 81 mg by mouth daily., Disp: , Rfl:  .  atorvastatin (LIPITOR) 40 MG tablet, TAKE 1 TABLET EVERY NIGHT, Disp: 90 tablet, Rfl: 1 .  Blood Glucose Monitoring Suppl (ACCU-CHEK AVIVA PLUS) w/Device KIT, Use as directed to check blood sugars 1 time per day dx: e11.22, Disp: 1 kit, Rfl: 1 .  celecoxib (CELEBREX) 200 MG capsule, Take 1  capsule (200 mg total) by mouth 2 (two) times daily., Disp: 90 capsule, Rfl: 2 .  glucose blood (ACCU-CHEK AVIVA PLUS) test strip, Use as instructed to check blood sugars 1 time per day dx: e11.22, Disp: 150 each, Rfl: 3 .  losartan (COZAAR) 50 MG tablet, TAKE 1 TABLET EVERY DAY, Disp: 90 tablet, Rfl: 1 .  metFORMIN (GLUCOPHAGE-XR) 750 MG 24 hr tablet, TAKE 2 TABLETS EVERY EVENING, Disp: 180 tablet, Rfl: 1 .  sitaGLIPtin (JANUVIA) 100 MG tablet,  Take 100 mg by mouth daily., Disp: , Rfl:  .  venlafaxine (EFFEXOR) 37.5 MG tablet, TAKE 1 TABLET EVERY DAY, Disp: 90 tablet, Rfl: 1   Allergies  Allergen Reactions  . Codeine      Review of Systems  Constitutional: Negative.   Eyes: Negative for blurred vision.  Respiratory: Negative.  Negative for shortness of breath.   Cardiovascular: Negative.  Negative for chest pain and palpitations.  Gastrointestinal: Negative.   Psychiatric/Behavioral: Negative.   All other systems reviewed and are negative.    Today's Vitals   07/15/20 0942  BP: 124/72  Pulse: 86  Temp: 97.9 F (36.6 C)  TempSrc: Oral  Weight: 154 lb 6.4 oz (70 kg)  Height: 5' 1.75" (1.568 m)   Body mass index is 28.47 kg/m.  Wt Readings from Last 3 Encounters:  07/15/20 154 lb 6.4 oz (70 kg)  06/26/20 158 lb 6.4 oz (71.8 kg)  05/07/20 158 lb 9.6 oz (71.9 kg)   Objective:  Physical Exam Vitals and nursing note reviewed.  Constitutional:      Appearance: Normal appearance.  HENT:     Head: Normocephalic and atraumatic.     Nose:     Comments: Masked     Mouth/Throat:     Comments: Masked  Cardiovascular:     Rate and Rhythm: Normal rate and regular rhythm.     Heart sounds: Normal heart sounds.  Pulmonary:     Effort: Pulmonary effort is normal.     Breath sounds: Normal breath sounds.  Musculoskeletal:     Cervical back: Normal range of motion.  Skin:    General: Skin is warm.  Neurological:     General: No focal deficit present.     Mental Status: She is alert.  Psychiatric:        Mood and Affect: Mood normal.        Behavior: Behavior normal.         Assessment And Plan:     1. Uncontrolled type 2 diabetes mellitus with hyperglycemia (HCC) Comments: Chronic, I will check labs as listed below. I will adjust meds as needed.  - Hemoglobin A1c - CMP14+EGFR - Lipid panel  2. Essential hypertension, benign Comments: Chronic, controlled. She is encouraged to follow low sodium diet. I  will check renal function today.   3. Pure hypercholesterolemia Comments: I will check lipid panel today. She is aware goal LDL is less than 70. Advised to take statin as prescribed, and to increase her exercise daily.      Patient was given opportunity to ask questions. Patient verbalized understanding of the plan and was able to repeat key elements of the plan. All questions were answered to their satisfaction.   I, Maximino Greenland, MD, have reviewed all documentation for this visit. The documentation on 07/21/20 for the exam, diagnosis, procedures, and orders are all accurate and complete.   IF YOU HAVE BEEN REFERRED TO A SPECIALIST, IT MAY TAKE 1-2 WEEKS TO SCHEDULE/PROCESS  THE REFERRAL. IF YOU HAVE NOT HEARD FROM US/SPECIALIST IN TWO WEEKS, PLEASE GIVE [place] A CALL AT [phone] X [phone].   THE PATIENT IS ENCOURAGED TO PRACTICE SOCIAL DISTANCING DUE TO THE COVID-19 PANDEMIC.

## 2020-07-15 NOTE — Patient Instructions (Signed)
Diabetes Mellitus and Foot Care Foot care is an important part of your health, especially when you have diabetes. Diabetes may cause you to have problems because of poor blood flow (circulation) to your feet and legs, which can cause your skin to:  Become thinner and drier.  Break more easily.  Heal more slowly.  Peel and crack. You may also have nerve damage (neuropathy) in your legs and feet, causing decreased feeling in them. This means that you may not notice minor injuries to your feet that could lead to more serious problems. Noticing and addressing any potential problems early is the best way to prevent future foot problems. How to care for your feet Foot hygiene  Wash your feet daily with warm water and mild soap. Do not use hot water. Then, pat your feet and the areas between your toes until they are completely dry. Do not soak your feet as this can dry your skin.  Trim your toenails straight across. Do not dig under them or around the cuticle. File the edges of your nails with an emery board or nail file.  Apply a moisturizing lotion or petroleum jelly to the skin on your feet and to dry, brittle toenails. Use lotion that does not contain alcohol and is unscented. Do not apply lotion between your toes.   Shoes and socks  Wear clean socks or stockings every day. Make sure they are not too tight. Do not wear knee-high stockings since they may decrease blood flow to your legs.  Wear shoes that fit properly and have enough cushioning. Always look in your shoes before you put them on to be sure there are no objects inside.  To break in new shoes, wear them for just a few hours a day. This prevents injuries on your feet. Wounds, scrapes, corns, and calluses  Check your feet daily for blisters, cuts, bruises, sores, and redness. If you cannot see the bottom of your feet, use a mirror or ask someone for help.  Do not cut corns or calluses or try to remove them with medicine.  If you  find a minor scrape, cut, or break in the skin on your feet, keep it and the skin around it clean and dry. You may clean these areas with mild soap and water. Do not clean the area with peroxide, alcohol, or iodine.  If you have a wound, scrape, corn, or callus on your foot, look at it several times a day to make sure it is healing and not infected. Check for: ? Redness, swelling, or pain. ? Fluid or blood. ? Warmth. ? Pus or a bad smell.   General tips  Do not cross your legs. This may decrease blood flow to your feet.  Do not use heating pads or hot water bottles on your feet. They may burn your skin. If you have lost feeling in your feet or legs, you may not know this is happening until it is too late.  Protect your feet from hot and cold by wearing shoes, such as at the beach or on hot pavement.  Schedule a complete foot exam at least once a year (annually) or more often if you have foot problems. Report any cuts, sores, or bruises to your health care provider immediately. Where to find more information  American Diabetes Association: www.diabetes.org  Association of Diabetes Care & Education Specialists: www.diabeteseducator.org Contact a health care provider if:  You have a medical condition that increases your risk of infection and   you have any cuts, sores, or bruises on your feet.  You have an injury that is not healing.  You have redness on your legs or feet.  You feel burning or tingling in your legs or feet.  You have pain or cramps in your legs and feet.  Your legs or feet are numb.  Your feet always feel cold.  You have pain around any toenails. Get help right away if:  You have a wound, scrape, corn, or callus on your foot and: ? You have pain, swelling, or redness that gets worse. ? You have fluid or blood coming from the wound, scrape, corn, or callus. ? Your wound, scrape, corn, or callus feels warm to the touch. ? You have pus or a bad smell coming from  the wound, scrape, corn, or callus. ? You have a fever. ? You have a red line going up your leg. Summary  Check your feet every day for blisters, cuts, bruises, sores, and redness.  Apply a moisturizing lotion or petroleum jelly to the skin on your feet and to dry, brittle toenails.  Wear shoes that fit properly and have enough cushioning.  If you have foot problems, report any cuts, sores, or bruises to your health care provider immediately.  Schedule a complete foot exam at least once a year (annually) or more often if you have foot problems. This information is not intended to replace advice given to you by your health care provider. Make sure you discuss any questions you have with your health care provider. Document Revised: 10/11/2019 Document Reviewed: 10/11/2019 Elsevier Patient Education  2021 [employer].  

## 2020-07-16 ENCOUNTER — Telehealth: Payer: Self-pay

## 2020-07-16 NOTE — Chronic Care Management (AMB) (Signed)
    Chronic Care Management Pharmacy Assistant   Name: Safina [facility]  MRN: [id number] DOB: 1945-05-21  Reason for Encounter: Patient Assistance Coordination   07/16/2020- Mailing patient assistance from for Januvia to [employer] Patient Assistance Program. [name], CPP aware. Awaiting response from patient assistance program. Copy of insurance card and income documentation sent with form. [name], CPP notified.   Medications: Outpatient Encounter Medications as of 07/16/2020  Medication Sig  . Accu-Chek Softclix Lancets lancets Use as instructed to check blood sugars 1 time per day dx: e11.22  . Alcohol Swabs (B-D SINGLE USE SWABS BUTTERFLY) PADS 100 Pieces by Does not apply route as needed.  Marland Kitchen aspirin 81 MG tablet Take 81 mg by mouth daily.  Marland Kitchen atorvastatin (LIPITOR) 40 MG tablet TAKE 1 TABLET EVERY NIGHT  . Blood Glucose Monitoring Suppl (ACCU-CHEK AVIVA PLUS) w/Device KIT Use as directed to check blood sugars 1 time per day dx: e11.22  . celecoxib (CELEBREX) 200 MG capsule Take 1 capsule (200 mg total) by mouth 2 (two) times daily.  Marland Kitchen glucose blood (ACCU-CHEK AVIVA PLUS) test strip Use as instructed to check blood sugars 1 time per day dx: e11.22  . losartan (COZAAR) 50 MG tablet TAKE 1 TABLET EVERY DAY  . metFORMIN (GLUCOPHAGE-XR) 750 MG 24 hr tablet TAKE 2 TABLETS EVERY EVENING  . sitaGLIPtin (JANUVIA) 100 MG tablet Take 100 mg by mouth daily.  Marland Kitchen venlafaxine (EFFEXOR) 37.5 MG tablet TAKE 1 TABLET EVERY DAY   No facility-administered encounter medications on file as of 07/16/2020.    Star Rating Drugs: Januvia 100 mg- No fill history Metformin 750 mg- Last filled 06/18/2020 for 90 day supply Losartan 50 mg- Last filled 06/18/2020 for 90 day supply Atorvastatin 40 mg- Last filled 06/18/2020 for 90 day supply  Pioglitazone 30 mg- Last filled 06/18/2020 for 90 day supply   SIG: Pattricia Boss, [place]

## 2020-07-23 ENCOUNTER — Telehealth: Payer: Self-pay

## 2020-07-23 NOTE — Chronic Care Management (AMB) (Signed)
Called and spoke with the patient reminding her of upcoming CCM Call appointment on 07/24/20 at 8:30AM with [name], CPP. The patient was made aware to have medications and supplements nearby during phone call with CPP, [name]. Patient verbalized understanding. Patient states she would like to be called on her cell phone for this appointment.  Lizbeth Bark Clinical Pharmacist Assistant [phone]

## 2020-07-24 ENCOUNTER — Telehealth: Payer: Self-pay

## 2020-07-24 NOTE — Progress Notes (Deleted)
Chronic Care Management Pharmacy Note  07/24/2020 Name:  Virginia Russell [facility] MRN:  [id number] DOB:  [birth year]  Subjective: Virginia Russell is an 75 y.o. year old female who is a primary patient of Glendale Chard, MD.  The CCM team was consulted for assistance with disease management and care coordination needs.    {CCMTELEPHONEFACETOFACE:21091510} for {CCMINITIALFOLLOWUPCHOICE:21091511} in response to provider referral for pharmacy case management and/or care coordination services.   Consent to Services:  {CCMCONSENTOPTIONS:25074}  Patient Care Team: Glendale Chard, MD as PCP - General (Internal Medicine) Skeet Latch, MD as PCP - Cardiology (Cardiology)  Recent office visits: ***  Recent consult visits: [facility] visits: {Hospital DC Yes/No:25215}  Objective:  Lab Results  Component Value Date   CREATININE 0.69 07/15/2020   BUN 22 07/15/2020   GFRNONAA 84 03/13/2020   GFRAA 96 03/13/2020   NA 139 07/15/2020   K 4.6 07/15/2020   CALCIUM 9.8 07/15/2020   CO2 25 07/15/2020   GLUCOSE 148 (H) 07/15/2020    Lab Results  Component Value Date/Time   HGBA1C 7.6 (H) 07/15/2020 10:13 AM   HGBA1C 7.4 (H) 03/13/2020 03:07 PM   MICROALBUR 10 03/07/2019 05:07 PM    Last diabetic Eye exam:  Lab Results  Component Value Date/Time   HMDIABEYEEXA No Retinopathy 10/09/2019 12:00 AM    Last diabetic Foot exam: No results found for: HMDIABFOOTEX   Lab Results  Component Value Date   CHOL 157 07/15/2020   HDL 63 07/15/2020   LDLCALC 80 07/15/2020   TRIG 75 07/15/2020   CHOLHDL 2.5 07/15/2020    Hepatic Function Latest Ref Rng & Units 07/15/2020 10/23/2019 03/07/2019  Total Protein 6.0 - 8.5 g/dL 7.0 6.6 6.8  Albumin 3.7 - 4.7 g/dL 4.5 4.4 4.4  AST 0 - 40 IU/L _0 ALT 0 - 32 IU/L _1 Alk Phosphatase 44 - 121 IU/L 68 64 60  Total Bilirubin 0.0 - 1.2 mg/dL 1.0 0.8 0.9    Lab Results  Component Value Date/Time   TSH 1.870 06/19/2019 12:33 PM     CBC Latest Ref Rng & Units 03/13/2020 03/07/2019  WBC 3.4 - 10.8 x10E3/uL 6.4 6.8  Hemoglobin 11.1 - 15.9 g/dL 12.8 12.6  Hematocrit 34.0 - 46.6 % 37.7 38.2  Platelets 150 - 450 x10E3/uL 171 144(L)    No results found for: VD25OH  Clinical ASCVD: {YES/NO:21197} The 10-year ASCVD risk score Mikey Bussing DC Jr., et al., 2013) is: 34.3%   Values used to calculate the score:     Age: 61 years     Sex: Female     Is Non-Hispanic African American: No     Diabetic: Yes     Tobacco smoker: No     Systolic Blood Pressure: 333 mmHg     Is BP treated: Yes     HDL Cholesterol: 63 mg/dL     Total Cholesterol: 157 mg/dL    Depression screen [facility] 2/9 07/15/2020 03/13/2020 03/13/2020  Decreased Interest 0 0 0  Down, Depressed, Hopeless 0 0 0  PHQ - 2 Score 0 0 0  Altered sleeping 0 0 -  Tired, decreased energy 0 0 -  Change in appetite 1 0 -  Feeling bad or failure about yourself  0 0 -  Trouble concentrating 0 0 -  Moving slowly or fidgety/restless 0 0 -  Suicidal thoughts 0 0 -  PHQ-9 Score 1 0 -  Difficult doing work/chores Not difficult at all Not  difficult at all -     ***Other: (CHADS2VASc if Afib, MMRC or CAT for COPD, ACT, DEXA)  Social History   Tobacco Use  Smoking Status Never Smoker  Smokeless Tobacco Never Used   BP Readings from Last 3 Encounters:  07/15/20 124/72  06/26/20 138/68  05/07/20 120/82   Pulse Readings from Last 3 Encounters:  07/15/20 86  06/26/20 79  05/07/20 84   Wt Readings from Last 3 Encounters:  07/15/20 154 lb 6.4 oz (70 kg)  06/26/20 158 lb 6.4 oz (71.8 kg)  05/07/20 158 lb 9.6 oz (71.9 kg)   BMI Readings from Last 3 Encounters:  07/15/20 28.47 kg/m  06/26/20 29.21 kg/m  05/07/20 29.97 kg/m    Assessment/Interventions: Review of patient past medical history, allergies, medications, health status, including review of consultants reports, laboratory and other test data, was performed as part of comprehensive evaluation and provision of  chronic care management services.   SDOH:  (Social Determinants of Health) assessments and interventions performed: {yes/no:20286}  SDOH Screenings   Alcohol Screen: Not on file  Depression (PHQ2-9): Low Risk   . PHQ-2 Score: 1  Financial Resource Strain: Low Risk   . Difficulty of Paying Living Expenses: Not hard at all  Food Insecurity: No Food Insecurity  . Worried About Charity fundraiser in the Last Year: Never true  . Ran Out of Food in the Last Year: Never true  Housing: Not on file  Physical Activity: Inactive  . Days of Exercise per Week: 0 days  . Minutes of Exercise per Session: 0 min  Social Connections: Not on file  Stress: No Stress Concern Present  . Feeling of Stress : Not at all  Tobacco Use: Low Risk   . Smoking Tobacco Use: Never Smoker  . Smokeless Tobacco Use: Never Used  Transportation Needs: No Transportation Needs  . Lack of Transportation (Medical): No  . Lack of Transportation (Non-Medical): No    CCM Care Plan  Allergies  Allergen Reactions  . Codeine     Medications Reviewed Today    Reviewed by Glendale Chard, MD (Physician) on 07/15/20 at 1000  Med List Status: <None>  Medication Order Taking? Sig Documenting Provider Last Dose Status Informant  Accu-Chek Softclix Lancets lancets [id number] Yes Use as instructed to check blood sugars 1 time per day dx: e11.22 Glendale Chard, MD Taking Active   Alcohol Swabs (B-D SINGLE USE SWABS BUTTERFLY) PADS [id number] Yes 100 Pieces by Does not apply route as needed. Glendale Chard, MD Taking Active   aspirin 81 MG tablet [id number] Yes Take 81 mg by mouth daily. [provider] Taking Active   atorvastatin (LIPITOR) 40 MG tablet [id number] Yes TAKE 1 TABLET EVERY NIGHT Glendale Chard, MD Taking Active   Blood Glucose Monitoring Suppl (ACCU-CHEK AVIVA PLUS) w/Device KIT [id number] Yes Use as directed to check blood sugars 1 time per day dx: e11.22 Glendale Chard, MD Taking Active   celecoxib (CELEBREX)  200 MG capsule [id number] Yes Take 1 capsule (200 mg total) by mouth 2 (two) times daily. Glendale Chard, MD Taking Active   glucose blood (ACCU-CHEK AVIVA PLUS) test strip [id number] Yes Use as instructed to check blood sugars 1 time per day dx: e11.22 Glendale Chard, MD Taking Active   losartan (COZAAR) 50 MG tablet [id number] Yes TAKE 1 TABLET EVERY DAY Glendale Chard, MD Taking Active   metFORMIN (GLUCOPHAGE-XR) 750 MG 24 hr tablet [id number] Yes TAKE 2 TABLETS EVERY Fredric Mare, MD Taking Active  pioglitazone (ACTOS) 30 MG tablet [id number] Yes TAKE 1 TABLET EVERY DAY Glendale Chard, MD Taking Active   sitaGLIPtin (JANUVIA) 100 MG tablet [id number] Yes Take 100 mg by mouth daily. [provider] Taking Active   venlafaxine (EFFEXOR) 37.5 MG tablet [id number] Yes TAKE 1 TABLET EVERY DAY Glendale Chard, MD Taking Active           Patient Active Problem List   Diagnosis Date Noted  . Aortic stenosis, mild 02/11/2020  . Hyperlipidemia LDL goal <70 02/11/2020  . Carotid stenosis, bilateral 02/11/2020  . Osteopenia after menopause 09/19/2018  . Overweight (BMI 25.0-29.9) 09/19/2018  . Uncontrolled type 2 diabetes mellitus with hyperglycemia ([place]) 05/15/2018  . Essential hypertension, benign 05/15/2018  . Recurrent major depressive disorder, in partial remission ([place]) 05/15/2018  . Postmenopausal bleeding 12/28/2012  . Dyspareunia 12/28/2012  . Menopause 12/28/2012  . Vaginal atrophy 12/28/2012    Immunization History  Administered Date(s) Administered  . Fluad Quad(high Dose 65+) 12/25/2018  . Influenza, High Dose Seasonal PF 12/18/2019  . PFIZER(Purple Top)SARS-COV-2 Vaccination 05/03/2019, 05/25/2019, 02/01/2020  . Pneumococcal Conjugate-13 12/19/2017  . Pneumococcal Polysaccharide-23 12/25/2018    Conditions to be addressed/monitored:  {USCCMDZASSESSMENTOPTIONS:23563}  There are no care plans that you recently modified to display for this patient.     Medication Assistance: {MEDASSISTANCEINFO:25044}  Patient's preferred pharmacy is:  [id number] [place] [place]Phone: [phone]: [phone] [facility] [place]: [phone]: [phone] [address] [place]Phone: [phone]: [phone] Uses pill box? {Yes or If no, why not?:20788} Pt endorses ***% compliance  We discussed: {Pharmacy options:24294} Patient decided to: {US Pharmacy Plan:23885}  Care Plan and Follow Up Patient Decision:  {FOLLOWUP:24991}  Plan: {CM FOLLOW UP PHYWV:37106} ***   Current Barriers:  . {pharmacybarriers:24917}  Pharmacist Clinical Goal(s):  .Marland KitchenPatient will {PHARMACYGOALCHOICES:24921} through collaboration with PharmD and provider.   Interventions: . 1:1 collaboration with SGlendale Chard MD regarding development and update of comprehensive plan of care as evidenced by provider attestation and co-signature . Inter-disciplinary care team collaboration (see longitudinal plan of care) . Comprehensive medication review performed; medication list updated in electronic medical record  Hypertension (BP goal <130/80) -Controlled -Current treatment: . *** -Medications previously tried: ***  -Current home readings: *** -Current dietary habits: *** -Current exercise habits: *** -{ACTIONS;DENIES/REPORTS:21021675::"Denies"} hypotensive/hypertensive symptoms -Educated on {CCM BP Counseling:25124} -Counseled to monitor BP at home ***, document, and provide log at future appointments -{CCMPHARMDINTERVENTION:25122}  Hyperlipidemia: (LDL goal < 70) -{US controlled/uncontrolled:25276} -Current treatment: . *** -Medications previously tried: ***  -Current dietary patterns:  *** -Current exercise habits: *** -Educated on {CCM HLD Counseling:25126} -{CCMPHARMDINTERVENTION:25122}  Diabetes (A1c goal <7%) -{US controlled/uncontrolled:25276} -Current medications: . *** -Medications previously tried: ***  -Current home glucose readings . fasting glucose: *** . post prandial glucose: *** -{ACTIONS;DENIES/REPORTS:21021675::"Denies"} hypoglycemic/hyperglycemic symptoms -Current meal patterns:  . breakfast: ***  . lunch: ***  . dinner: *** . snacks: *** . drinks: *** -Current exercise: *** -Educated on {CCM DM COUNSELING:25123} -Counseled to check feet daily and get yearly eye exams -{CCMPHARMDINTERVENTION:25122}   Patient Goals/Self-Care Activities . Patient will:  - {pharmacypatientgoals:24919}  Follow Up Plan: {CM FOLLOW UP PYIRS:85462}

## 2020-08-13 ENCOUNTER — Ambulatory Visit: Payer: Medicare HMO | Admitting: Nurse Practitioner

## 2020-09-13 ENCOUNTER — Other Ambulatory Visit: Payer: Self-pay | Admitting: Internal Medicine

## 2020-09-20 ENCOUNTER — Other Ambulatory Visit: Payer: Self-pay | Admitting: Internal Medicine

## 2020-10-02 ENCOUNTER — Other Ambulatory Visit: Payer: Medicare HMO

## 2020-10-02 DIAGNOSIS — R059 Cough, unspecified: Secondary | ICD-10-CM | POA: Diagnosis not present

## 2020-10-02 MED ORDER — AMOXICILLIN-POT CLAVULANATE 875-125 MG PO TABS: 1.0000 | ORAL_TABLET | Freq: Two times a day (BID) | ORAL | 0 refills | Status: AC

## 2020-10-02 MED ORDER — BENZONATATE 100 MG PO CAPS: 100.0000 mg | ORAL_CAPSULE | Freq: Three times a day (TID) | ORAL | 0 refills | Status: DC

## 2020-10-03 ENCOUNTER — Encounter: Payer: Self-pay | Admitting: Internal Medicine

## 2020-10-03 LAB — SARS-COV-2, NAA 2 DAY TAT

## 2020-10-03 LAB — NOVEL CORONAVIRUS, NAA: SARS-CoV-2, NAA: NOT DETECTED

## 2020-10-14 ENCOUNTER — Other Ambulatory Visit: Payer: Self-pay

## 2020-10-14 ENCOUNTER — Ambulatory Visit (INDEPENDENT_AMBULATORY_CARE_PROVIDER_SITE_OTHER): Payer: Medicare HMO | Admitting: Internal Medicine

## 2020-10-14 ENCOUNTER — Encounter: Payer: Self-pay | Admitting: Internal Medicine

## 2020-10-14 VITALS — BP 138/80 | HR 70 | Temp 98.3°F | Ht 61.0 in | Wt 149.8 lb

## 2020-10-14 DIAGNOSIS — J069 Acute upper respiratory infection, unspecified: Secondary | ICD-10-CM | POA: Diagnosis not present

## 2020-10-14 DIAGNOSIS — F4321 Adjustment disorder with depressed mood: Secondary | ICD-10-CM | POA: Diagnosis not present

## 2020-10-14 DIAGNOSIS — Z23 Encounter for immunization: Secondary | ICD-10-CM

## 2020-10-14 DIAGNOSIS — F3341 Major depressive disorder, recurrent, in partial remission: Secondary | ICD-10-CM

## 2020-10-14 DIAGNOSIS — I10 Essential (primary) hypertension: Secondary | ICD-10-CM

## 2020-10-14 DIAGNOSIS — E663 Overweight: Secondary | ICD-10-CM | POA: Diagnosis not present

## 2020-10-14 DIAGNOSIS — E1165 Type 2 diabetes mellitus with hyperglycemia: Secondary | ICD-10-CM

## 2020-10-14 DIAGNOSIS — Z6828 Body mass index (BMI) 28.0-28.9, adult: Secondary | ICD-10-CM

## 2020-10-14 MED ORDER — BENZONATATE 100 MG PO CAPS: 100.0000 mg | ORAL_CAPSULE | Freq: Three times a day (TID) | ORAL | 1 refills | Status: DC

## 2020-10-14 NOTE — Progress Notes (Signed)
Earleen Newport as a Education administrator for Maximino Greenland, MD.,have documented all relevant documentation on the behalf of Maximino Greenland, MD,as directed by  Maximino Greenland, MD while in the presence of Maximino Greenland, MD.  This visit occurred during the SARS-CoV-2 public health emergency.  Safety protocols were in place, including screening questions prior to the visit, additional usage of staff PPE, and extensive cleaning of exam room while observing appropriate contact time as indicated for disinfecting solutions.  Subjective:     Patient ID: Virginia Russell , female    DOB: 1945/06/28 , 75 y.o.   MRN: [id number]   Chief Complaint  Patient presents with   Diabetes   Hypertension    HPI  The patient is here today for a follow-up on her diabetes.  The patient was recently started on Januvia 100 mg daily. She had to switch to Januvia b/c she is waiting for her Nes[place] from [place] [facility] to come in. She has not had any issues with Januvia.   Diabetes She presents for her follow-up diabetic visit. She has type 2 diabetes mellitus. No MedicAlert identification noted. The initial diagnosis of diabetes was made 1 year ago. Her disease course has been stable. There are no hypoglycemic associated symptoms. Pertinent negatives for diabetes include no blurred vision and no chest pain. There are no hypoglycemic complications. Symptoms are stable. Risk factors for coronary artery disease include dyslipidemia, diabetes mellitus, hypertension, post-menopausal and stress. She is compliant with treatment all of the time. She is following a diabetic diet. She participates in exercise intermittently. Her breakfast blood glucose is taken between 7-8 am. Her breakfast blood glucose range is generally 90-110 mg/dl. An ACE inhibitor/angiotensin II receptor blocker is being taken. Eye exam is current.  Hypertension This is a chronic problem. The current episode started more than 1 year ago. The problem has been  gradually improving since onset. The problem is controlled. Pertinent negatives include no blurred vision, chest pain, palpitations or shortness of breath. Past treatments include ACE inhibitors and diuretics. The current treatment provides moderate improvement.    Past Medical History:  Diagnosis Date   Anxiety    Aortic stenosis, mild 02/11/2020   Mean gradient 9 mmHg on 11/2019   Carotid stenosis, bilateral 02/11/2020   Diabetes mellitus without complication (HCC)    High cholesterol    HTN (hypertension)    Hyperlipidemia LDL goal <70 02/11/2020     Family History  Problem Relation Age of Onset   Breast cancer Mother    Heart disease Mother    Heart attack Mother    Stroke Father    Heart attack Father    Heart attack Daughter    Heart attack Brother    Heart attack Maternal Grandmother    Heart attack Paternal Grandmother      Current Outpatient Medications:    Accu-Chek Softclix Lancets lancets, Use as instructed to check blood sugars 1 time per day dx: e11.22, Disp: 150 each, Rfl: 3   Alcohol Swabs (B-D SINGLE USE SWABS BUTTERFLY) PADS, 100 Pieces by Does not apply route as needed., Disp: 100 each, Rfl: 3   aspirin 81 MG tablet, Take 81 mg by mouth daily., Disp: , Rfl:    atorvastatin (LIPITOR) 40 MG tablet, TAKE 1 TABLET EVERY NIGHT, Disp: 90 tablet, Rfl: 1   Blood Glucose Monitoring Suppl (ACCU-CHEK AVIVA PLUS) w/Device KIT, Use as directed to check blood sugars 1 time per day dx: e11.22, Disp: 1 kit, Rfl:  1   celecoxib (CELEBREX) 200 MG capsule, Take 1 capsule (200 mg total) by mouth 2 (two) times daily., Disp: 90 capsule, Rfl: 2   glucose blood (ACCU-CHEK AVIVA PLUS) test strip, Use as instructed to check blood sugars 1 time per day dx: e11.22, Disp: 150 each, Rfl: 3   losartan (COZAAR) 50 MG tablet, TAKE 1 TABLET EVERY DAY, Disp: 90 tablet, Rfl: 1   pioglitazone (ACTOS) 30 MG tablet, TAKE 1 TABLET EVERY DAY, Disp: 90 tablet, Rfl: 1   sitaGLIPtin (JANUVIA) 100 MG tablet,  Take 100 mg by mouth daily., Disp: , Rfl:    venlafaxine (EFFEXOR) 37.5 MG tablet, TAKE 1 TABLET EVERY DAY, Disp: 90 tablet, Rfl: 1   Zoster Vaccine Adjuvanted (SHINGRIX) injection, Inject 0.5 mLs into the muscle once for 1 dose., Disp: 0.5 mL, Rfl: 0   benzonatate (TESSALON) 100 MG capsule, Take 1 capsule (100 mg total) by mouth 3 (three) times daily., Disp: 30 capsule, Rfl: 1   metFORMIN (GLUCOPHAGE-XR) 750 MG 24 hr tablet, TAKE 2 TABLETS EVERY EVENING (Patient not taking: Reported on 10/14/2020), Disp: 180 tablet, Rfl: 1   Allergies  Allergen Reactions   Codeine      Review of Systems  Constitutional: Negative.   Eyes:  Negative for blurred vision.  Respiratory:  Positive for cough. Negative for shortness of breath.        She c/o persistent cough. Recently treated for bronchitis. She tested neg for COVID. Cough is nonproductive. Unfortunately, it still keeps her up at night. Sx did improve with Tessalon perles, needs refill.   Cardiovascular: Negative.  Negative for chest pain and palpitations.  Gastrointestinal: Negative.   Neurological: Negative.   Psychiatric/Behavioral:  Positive for dysphoric mood.   All other systems reviewed and are negative.   Today's Vitals   10/14/20 1133  BP: 138/80  Pulse: 70  Temp: 98.3 F (36.8 C)  Weight: 149 lb 12.8 oz (67.9 kg)  Height: _0  (1.549 m)   Body mass index is 28.3 kg/m.  Wt Readings from Last 3 Encounters:  10/14/20 149 lb 12.8 oz (67.9 kg)  07/15/20 154 lb 6.4 oz (70 kg)  06/26/20 158 lb 6.4 oz (71.8 kg)    Objective:  Physical Exam Vitals and nursing note reviewed.  Constitutional:      Appearance: Normal appearance.  HENT:     Head: Normocephalic and atraumatic.     Nose:     Comments: Masked     Mouth/Throat:     Comments: Masked  Eyes:     Extraocular Movements: Extraocular movements intact.  Cardiovascular:     Rate and Rhythm: Normal rate and regular rhythm.     Heart sounds: Normal heart sounds.   Pulmonary:     Effort: Pulmonary effort is normal.     Breath sounds: Normal breath sounds.  Musculoskeletal:     Cervical back: Normal range of motion.  Skin:    General: Skin is warm.  Neurological:     General: No focal deficit present.     Mental Status: She is alert.  Psychiatric:        Mood and Affect: Mood normal.        Behavior: Behavior normal.        Assessment And Plan:     1. Uncontrolled type 2 diabetes mellitus with hyperglycemia ([place]) Comments: Chjronic, I will check labs as listed below.  - BMP8+eGFR - Hemoglobin A1c  2. Essential hypertension, benign Comments: Chronic, fair control. Advised  to follow low sodium diet.   3. Viral upper respiratory tract infection Comments: Resolving. Advised to avoid dairy products. She was given refill on Tessalon perles. Will consider fexofenadine for PND if her sx persist.   4. Grief Comments: Declines hospice referral for grief counseling.   5. Recurrent major depressive disorder, in partial remission ([place]) Comments: This has been exacerbated by her Mom's recent death. She will c/w venlafaxine.   6. Overweight (BMI 25.0-29.9) Comments: BMI 28. Her BMI is acceptable for her demographic. Advised to aim for at least 150 minutes of exercise per week.   7. Immunization due Comments: I will send rx Shingrix to her local pharmacy. Advised to wait another week or two prior to getting vaccine (would like for cough to resolve).    Patient was given opportunity to ask questions. Patient verbalized understanding of the plan and was able to repeat key elements of the plan. All questions were answered to their satisfaction.   I, Maximino Greenland, MD, have reviewed all documentation for this visit. The documentation on 10/14/20 for the exam, diagnosis, procedures, and orders are all accurate and complete.   IF YOU HAVE BEEN REFERRED TO A SPECIALIST, IT MAY TAKE 1-2 WEEKS TO SCHEDULE/PROCESS THE REFERRAL. IF YOU HAVE NOT HEARD FROM  US/SPECIALIST IN TWO WEEKS, PLEASE GIVE [place] A CALL AT [phone].   THE PATIENT IS ENCOURAGED TO PRACTICE SOCIAL DISTANCING DUE TO THE COVID-19 PANDEMIC.

## 2020-10-14 NOTE — Patient Instructions (Signed)
Type 2 Diabetes Mellitus, Diagnosis, Adult Type 2 diabetes (type 2 diabetes mellitus) is a long-term disease. It may happen when there is one or both of these problems: The pancreas does not make enough insulin. The body does not react in a normal way to insulin that it makes. Insulin lets sugars go into cells in your body. If you have type 2 diabetes, sugars cannot get into your cells. Sugars build up in the blood. This causeshigh blood sugar. What are the causes? The exact cause of this condition is not known. What increases the risk? The following factors may make you more likely to develop this condition: Having type 2 diabetes in your family. Being overweight or very overweight. Not being active. Your body not reacting in a normal way to the insulin it makes. Having higher than normal blood sugar over time. Having a type of diabetes when you were pregnant. Having a condition that causes small fluid-filled sacs on your ovaries. What are the signs or symptoms? At first, you may have no symptoms. You will get symptoms slowly. They may include: More thirst than normal. More hunger than normal. Needing to pee more than normal. Losing weight without trying. Feeling tired. Feeling weak. Seeing things blurry. Dark patches on your skin. How is this treated? This condition may be treated by a diabetes expert. You may need to: Follow an eating plan made by a food expert (dietitian). Get regular exercise. Find ways to deal with stress. Check blood sugar as often as told. Take medicines. Your doctor will set treatment goals for you. Your blood sugar should be at these levels: Before meals: 80-130 mg/dL (4.4-7.2 mmol/L). After meals: below 180 mg/dL (10 mmol/L). Over the last 2-3 months: less than 7%. Follow these instructions at home: Medicines Take your diabetes medicines or insulin every day. Take medicines to help you not get other problems caused by this condition. You may  need: Aspirin. Medicine to lower cholesterol. Medicine to control blood pressure. Questions to ask your doctor Should I meet with a diabetes educator? What medicines do I need, and when should I take them? What will I need to treat my condition at home? When should I check my blood sugar? Where can I find a support group? Who can I call if I have questions? When is my next doctor visit? General instructions Take over-the-counter and prescription medicines only as told by your doctor. Keep all follow-up visits as told by your doctor. This is important. Where to find more information American Diabetes Association (ADA): www.diabetes.org American Association of Diabetes Care and Education Specialists (ADCES): www.diabeteseducator.org International Diabetes Federation (IDF): MemberVerification.ca Contact a doctor if: Your blood sugar is at or above 240 mg/dL (13.3 mmol/L) for 2 days in a row. You have been sick for 2 days or more, and you are not getting better. You have had a fever for 2 days or more, and you are not getting better. You have any of these problems for more than 6 hours: You cannot eat or drink. You feel like you may vomit. You vomit. You have watery poop (diarrhea). Get help right away if: Your blood sugar is very low. This means it is lower than 54 mg/dL (3 mmol/L). You feel mixed up (confused). You have trouble thinking clearly. You have trouble breathing. You have medium or large ketone levels in your pee. These symptoms may be an emergency. Do not wait to see if the symptoms will go away. Get medical help right away. Call  your local emergency services (911 in the [place] Do not drive yourself to the hospital. Summary Type 2 diabetes is a long-term disease. Your pancreas may not make enough insulin, or your body may not react in a normal way to insulin that it makes. This condition is treated with an eating plan, lifestyle changes, and medicines. Your doctor will set  treatment goals for you. These will help you keep your blood sugar in a healthy range. Keep all follow-up visits as told by your doctor. This is important. This information is not intended to replace advice given to you by your health care provider. Make sure you discuss any questions you have with your healthcare provider. Document Revised: 03/05/2020 Document Reviewed: 10/17/2019 Elsevier Patient Education  [place].

## 2020-10-15 LAB — BMP8+EGFR
BUN/Creatinine Ratio: 25 (ref 12–28)
BUN: 16 mg/dL (ref 8–27)
CO2: 24 mmol/L (ref 20–29)
Calcium: 9.8 mg/dL (ref 8.7–10.3)
Chloride: 102 mmol/L (ref 96–106)
Creatinine, Ser: 0.64 mg/dL (ref 0.57–1.00)
Glucose: 149 mg/dL — ABNORMAL HIGH (ref 65–99)
Potassium: 4.9 mmol/L (ref 3.5–5.2)
Sodium: 145 mmol/L — ABNORMAL HIGH (ref 134–144)
eGFR: 92 mL/min/{1.73_m2} (ref >59)

## 2020-10-15 LAB — HEMOGLOBIN A1C
Est. average glucose Bld gHb Est-mCnc: 183 mg/dL
Hgb A1c MFr Bld: 8 % — ABNORMAL HIGH (ref 4.8–5.6)

## 2020-10-19 MED ORDER — SHINGRIX 50 MCG/0.5ML IM SUSR: 0.5000 mL | Freq: Once | INTRAMUSCULAR | 0 refills | Status: AC

## 2020-10-23 DIAGNOSIS — M1712 Unilateral primary osteoarthritis, left knee: Secondary | ICD-10-CM | POA: Diagnosis not present

## 2020-11-21 DIAGNOSIS — M25562 Pain in left knee: Secondary | ICD-10-CM | POA: Diagnosis not present

## 2020-11-24 DIAGNOSIS — M1712 Unilateral primary osteoarthritis, left knee: Secondary | ICD-10-CM | POA: Diagnosis not present

## 2020-11-25 ENCOUNTER — Other Ambulatory Visit: Payer: Self-pay | Admitting: Internal Medicine

## 2020-12-17 ENCOUNTER — Ambulatory Visit: Payer: Medicare HMO

## 2020-12-17 ENCOUNTER — Telehealth: Payer: Self-pay

## 2020-12-17 NOTE — Telephone Encounter (Signed)
This nurse attempted to call patient in regard to missing today's AWV. Message left for her to call back in order to reschedule.

## 2020-12-22 ENCOUNTER — Telehealth: Payer: Self-pay | Admitting: Internal Medicine

## 2020-12-22 NOTE — Telephone Encounter (Signed)
Left message for patient to call back and schedule Medicare Annual Wellness Visit (AWV) either virtually or in office.  Left both my jabber number [phone] and office number    Last AWV 03/13/20 ; please schedule at anytime with [facility]    This should be a 45 minute visit.

## 2020-12-31 ENCOUNTER — Ambulatory Visit (INDEPENDENT_AMBULATORY_CARE_PROVIDER_SITE_OTHER): Payer: Medicare HMO

## 2020-12-31 ENCOUNTER — Other Ambulatory Visit: Payer: Self-pay

## 2020-12-31 VITALS — BP 130/64 | HR 71 | Temp 97.9°F | Ht 60.6 in | Wt 147.4 lb

## 2020-12-31 DIAGNOSIS — Z0000 Encounter for general adult medical examination without abnormal findings: Secondary | ICD-10-CM | POA: Diagnosis not present

## 2020-12-31 NOTE — Patient Instructions (Signed)
Virginia Russell , Thank you for taking time to come for your Medicare Wellness Visit. I appreciate your ongoing commitment to your health goals. Please review the following plan we discussed and let me know if I can assist you in the future.   Screening recommendations/referrals: Colonoscopy: cologuard 05/27/2019, due 05/26/2022 Mammogram: completed 05/27/2020 Bone Density: completed 04/04/2019 Recommended yearly ophthalmology/optometry visit for glaucoma screening and checkup Recommended yearly dental visit for hygiene and checkup  Vaccinations: Influenza vaccine: due Pneumococcal vaccine: completed 12/25/2018 Tdap vaccine: completed 01/09/2013, due 01/10/2023 Shingles vaccine: discussed   Covid-19: 02/01/2020, 05/25/2019, 05/03/2019  Advanced directives: Please bring a copy of your POA (Power of Attorney) and/or Living Will to your next appointment.   Conditions/risks identified: none  Next appointment: Follow up in one year for your annual wellness visit    Preventive Care 65 Years and Older, Female Preventive care refers to lifestyle choices and visits with your health care provider that can promote health and wellness. What does preventive care include? A yearly physical exam. This is also called an annual well check. Dental exams once or twice a year. Routine eye exams. Ask your health care provider how often you should have your eyes checked. Personal lifestyle choices, including: Daily care of your teeth and gums. Regular physical activity. Eating a healthy diet. Avoiding tobacco and drug use. Limiting alcohol use. Practicing safe sex. Taking low-dose aspirin every day. Taking vitamin and mineral supplements as recommended by your health care provider. What happens during an annual well check? The services and screenings done by your health care provider during your annual well check will depend on your age, overall health, lifestyle risk factors, and family history of  disease. Counseling  Your health care provider may ask you questions about your: Alcohol use. Tobacco use. Drug use. Emotional well-being. Home and relationship well-being. Sexual activity. Eating habits. History of falls. Memory and ability to understand (cognition). Work and work Statistician. Reproductive health. Screening  You may have the following tests or measurements: Height, weight, and BMI. Blood pressure. Lipid and cholesterol levels. These may be checked every 5 years, or more frequently if you are over 49 years old. Skin check. Lung cancer screening. You may have this screening every year starting at age 28 if you have a 30-pack-year history of smoking and currently smoke or have quit within the past 15 years. Fecal occult blood test (FOBT) of the stool. You may have this test every year starting at age 17. Flexible sigmoidoscopy or colonoscopy. You may have a sigmoidoscopy every 5 years or a colonoscopy every 10 years starting at age 61. Hepatitis C blood test. Hepatitis B blood test. Sexually transmitted disease (STD) testing. Diabetes screening. This is done by checking your blood sugar (glucose) after you have not eaten for a while (fasting). You may have this done every 1-3 years. Bone density scan. This is done to screen for osteoporosis. You may have this done starting at age 26. Mammogram. This may be done every 1-2 years. Talk to your health care provider about how often you should have regular mammograms. Talk with your health care provider about your test results, treatment options, and if necessary, the need for more tests. Vaccines  Your health care provider may recommend certain vaccines, such as: Influenza vaccine. This is recommended every year. Tetanus, diphtheria, and acellular pertussis (Tdap, Td) vaccine. You may need a Td booster every 10 years. Zoster vaccine. You may need this after age 73. Pneumococcal 13-valent conjugate (PCV13) vaccine. One  dose is recommended after age 35. Pneumococcal polysaccharide (PPSV23) vaccine. One dose is recommended after age 4. Talk to your health care provider about which screenings and vaccines you need and how often you need them. This information is not intended to replace advice given to you by your health care provider. Make sure you discuss any questions you have with your health care provider. Document Released: 04/18/2015 Document Revised: 12/10/2015 Document Reviewed: 01/21/2015 Elsevier Interactive Patient Education  2017 [place] Prevention in the Home Falls can cause injuries. They can happen to people of all ages. There are many things you can do to make your home safe and to help prevent falls. What can I do on the outside of my home? Regularly fix the edges of walkways and driveways and fix any cracks. Remove anything that might make you trip as you walk through a door, such as a raised step or threshold. Trim any bushes or trees on the path to your home. Use bright outdoor lighting. Clear any walking paths of anything that might make someone trip, such as rocks or tools. Regularly check to see if handrails are loose or broken. Make sure that both sides of any steps have handrails. Any raised decks and porches should have guardrails on the edges. Have any leaves, snow, or ice cleared regularly. Use sand or salt on walking paths during winter. Clean up any spills in your garage right away. This includes oil or grease spills. What can I do in the bathroom? Use night lights. Install grab bars by the toilet and in the tub and shower. Do not use towel bars as grab bars. Use non-skid mats or decals in the tub or shower. If you need to sit down in the shower, use a plastic, non-slip stool. Keep the floor dry. Clean up any water that spills on the floor as soon as it happens. Remove soap buildup in the tub or shower regularly. Attach bath mats securely with double-sided  non-slip rug tape. Do not have throw rugs and other things on the floor that can make you trip. What can I do in the bedroom? Use night lights. Make sure that you have a light by your bed that is easy to reach. Do not use any sheets or blankets that are too big for your bed. They should not hang down onto the floor. Have a firm chair that has side arms. You can use this for support while you get dressed. Do not have throw rugs and other things on the floor that can make you trip. What can I do in the kitchen? Clean up any spills right away. Avoid walking on wet floors. Keep items that you use a lot in easy-to-reach places. If you need to reach something above you, use a strong step stool that has a grab bar. Keep electrical cords out of the way. Do not use floor polish or wax that makes floors slippery. If you must use wax, use non-skid floor wax. Do not have throw rugs and other things on the floor that can make you trip. What can I do with my stairs? Do not leave any items on the stairs. Make sure that there are handrails on both sides of the stairs and use them. Fix handrails that are broken or loose. Make sure that handrails are as long as the stairways. Check any carpeting to make sure that it is firmly attached to the stairs. Fix any carpet that is loose or worn. Avoid  having throw rugs at the top or bottom of the stairs. If you do have throw rugs, attach them to the floor with carpet tape. Make sure that you have a light switch at the top of the stairs and the bottom of the stairs. If you do not have them, ask someone to add them for you. What else can I do to help prevent falls? Wear shoes that: Do not have high heels. Have rubber bottoms. Are comfortable and fit you well. Are closed at the toe. Do not wear sandals. If you use a stepladder: Make sure that it is fully opened. Do not climb a closed stepladder. Make sure that both sides of the stepladder are locked into place. Ask  someone to hold it for you, if possible. Clearly mark and make sure that you can see: Any grab bars or handrails. First and last steps. Where the edge of each step is. Use tools that help you move around (mobility aids) if they are needed. These include: Canes. Walkers. Scooters. Crutches. Turn on the lights when you go into a dark area. Replace any light bulbs as soon as they burn out. Set up your furniture so you have a clear path. Avoid moving your furniture around. If any of your floors are uneven, fix them. If there are any pets around you, be aware of where they are. Review your medicines with your doctor. Some medicines can make you feel dizzy. This can increase your chance of falling. Ask your doctor what other things that you can do to help prevent falls. This information is not intended to replace advice given to you by your health care provider. Make sure you discuss any questions you have with your health care provider. Document Released: 01/16/2009 Document Revised: 08/28/2015 Document Reviewed: 04/26/2014 Elsevier Interactive Patient Education  2017 [employer].

## 2020-12-31 NOTE — Progress Notes (Signed)
This visit occurred during the SARS-CoV-2 public health emergency.  Safety protocols were in place, including screening questions prior to the visit, additional usage of staff PPE, and extensive cleaning of exam room while observing appropriate contact time as indicated for disinfecting solutions.  Subjective:   Virginia Russell is a 75 y.o. female who presents for Medicare Annual (Subsequent) preventive examination.  Review of Systems     Cardiac Risk Factors include: advanced age (>29mn, >>65women);diabetes mellitus;dyslipidemia;hypertension     Objective:    Today's Vitals   12/31/20 0828 12/31/20 0832  BP: 138/64   Pulse: 71   Temp: 97.9 F (36.6 C)   TempSrc: Oral   SpO2: 93%   Weight: 147 lb 6.4 oz (66.9 kg)   Height: 5' 0.6" (1.539 m)   PainSc:  6    Body mass index is 28.22 kg/m.  Advanced Directives 12/31/2020 03/13/2020 03/07/2019  Does Patient Have a Medical Advance Directive? Yes Yes Yes  Type of [place]of [place] will [place] will [place] will  Copy of [place] Chart? No - copy requested No - copy requested No - copy requested    Current Medications (verified) Outpatient Encounter Medications as of 12/31/2020  Medication Sig   Accu-Chek Softclix Lancets lancets Use as instructed to check blood sugars 1 time per day dx: e11.22   Alcohol Swabs (B-D SINGLE USE SWABS BUTTERFLY) PADS 100 Pieces by Does not apply route as needed.   aspirin 81 MG tablet Take 81 mg by mouth daily.   atorvastatin (LIPITOR) 40 MG tablet TAKE 1 TABLET EVERY NIGHT   Blood Glucose Monitoring Suppl (ACCU-CHEK AVIVA PLUS) w/Device KIT Use as directed to check blood sugars 1 time per day dx: e11.22   celecoxib (CELEBREX) 200 MG capsule Take 1 capsule (200 mg total) by mouth 2 (two) times daily.   glucose blood (ACCU-CHEK AVIVA PLUS) test strip Use as instructed to check blood  sugars 1 time per day dx: e11.22   losartan (COZAAR) 50 MG tablet TAKE 1 TABLET EVERY DAY   metFORMIN (GLUCOPHAGE-XR) 750 MG 24 hr tablet TAKE 2 TABLETS EVERY EVENING   sitaGLIPtin (JANUVIA) 100 MG tablet Take 100 mg by mouth daily.   venlafaxine (EFFEXOR) 37.5 MG tablet TAKE 1 TABLET EVERY DAY   benzonatate (TESSALON) 100 MG capsule Take 1 capsule (100 mg total) by mouth 3 (three) times daily. (Patient not taking: Reported on 12/31/2020)   pioglitazone (ACTOS) 30 MG tablet TAKE 1 TABLET EVERY DAY (Patient not taking: Reported on 12/31/2020)   No facility-administered encounter medications on file as of 12/31/2020.    Allergies (verified) Codeine   History: Past Medical History:  Diagnosis Date   Anxiety    Aortic stenosis, mild 02/11/2020   Mean gradient 9 mmHg on 11/2019   Carotid stenosis, bilateral 02/11/2020   Diabetes mellitus without complication (HCC)    High cholesterol    HTN (hypertension)    Hyperlipidemia LDL goal <70 02/11/2020   Past Surgical History:  Procedure Laterality Date   ABDOMINAL HYSTERECTOMY     ABDOMINAL HYSTERECTOMY   BACK SURGERY     BREAST BIOPSY Left    NECK SURGERY     SHOULDER SURGERY     X2   Family History  Problem Relation Age of Onset   Breast cancer Mother    Heart disease Mother    Heart attack Mother    Stroke Father  Heart attack Father    Heart attack Daughter    Heart attack Brother    Heart attack Maternal Grandmother    Heart attack Paternal Grandmother    Social History   Socioeconomic History   Marital status: Married    Spouse name: Not on file   Number of children: 3   Years of education: Not on file   Highest education level: Not on file  Occupational History   Occupation: house cleaner  Tobacco Use   Smoking status: Never   Smokeless tobacco: Never  Vaping Use   Vaping Use: Never used  Substance and Sexual Activity   Alcohol use: No   Drug use: Never   Sexual activity: Not Currently  Other Topics Concern    Not on file  Social History Narrative   Not on file   Social Determinants of Health   Financial Resource Strain: Low Risk    Difficulty of Paying Living Expenses: Not hard at all  Food Insecurity: No Food Insecurity   Worried About Charity fundraiser in the Last Year: Never true   [place] in the Last Year: Never true  Transportation Needs: No Transportation Needs   Lack of Transportation (Medical): No   Lack of Transportation (Non-Medical): No  Physical Activity: Inactive   Days of Exercise per Week: 0 days   Minutes of Exercise per Session: 0 min  Stress: Stress Concern Present   Feeling of Stress : To some extent  Social Connections: Not on file    Tobacco Counseling Counseling given: Not Answered   Clinical Intake:  Pre-visit preparation completed: Yes  Pain : 0-10 Pain Score: 6  Pain Type: Acute pain Pain Location: Knee Pain Orientation: Left Pain Descriptors / Indicators: Aching Pain Onset: More than a month ago Pain Frequency: Constant     Nutritional Status: BMI 25 -29 Overweight Nutritional Risks: None Diabetes: Yes  How often do you need to have someone help you when you read instructions, pamphlets, or other written materials from your doctor or pharmacy?: 1 - Never What is the last grade level you completed in school?: 10th grade  Diabetic? Yes Nutrition Risk Assessment:  Has the patient had any N/V/D within the last 2 months?  No  Does the patient have any non-healing wounds?  No  Has the patient had any unintentional weight loss or weight gain?  No   Diabetes:  Is the patient diabetic?  Yes  If diabetic, was a CBG obtained today?  No  Did the patient bring in their glucometer from home?  No  How often do you monitor your CBG's? Twice daily.   Financial Strains and Diabetes Management:  Are you having any financial strains with the device, your supplies or your medication? No .  Does the patient want to be seen by Chronic Care  Management for management of their diabetes?  No  Would the patient like to be referred to a Nutritionist or for Diabetic Management?  No   Diabetic Exams:  Diabetic Eye Exam: Overdue for diabetic eye exam. Pt has been advised about the importance in completing this exam. Patient advised to call and schedule an eye exam. Diabetic Foot Exam: Completed 03/13/2020          Activities of Daily Living In your present state of health, do you have any difficulty performing the following activities: 12/31/2020 03/13/2020  Hearing? N N  Vision? N N  Difficulty concentrating or making decisions? N N  Walking or climbing stairs? N N  Dressing or bathing? N N  Doing errands, shopping? N N  Preparing Food and eating ? N N  Using the Toilet? N N  In the past six months, have you accidently leaked urine? N N  Do you have problems with loss of bowel control? N N  Managing your Medications? N N  Managing your Finances? N N  Housekeeping or managing your Housekeeping? N N  Some recent data might be hidden    Patient Care Team: Glendale Chard, MD as PCP - General (Internal Medicine) Skeet Latch, MD as PCP - Cardiology (Cardiology)  Indicate any recent Medical Services you may have received from other than [facility] providers in the past year (date may be approximate).     Assessment:   This is a routine wellness examination for [place].  Hearing/Vision screen No results found.  Dietary issues and exercise activities discussed: Current Exercise Habits: The patient has a physically strenuous job, but has no regular exercise apart from work.   Goals Addressed             This Visit's Progress    Patient Stated       12/31/2020, stay healthy       Depression Screen PHQ 2/9 Scores 12/31/2020 10/14/2020 07/15/2020 03/13/2020 03/13/2020 10/22/2019 06/19/2019  PHQ - 2 Score 0 6 0 0 0 3 0  PHQ- 9 Score - 18 1 0 - 6 3    Fall Risk Fall Risk  12/31/2020 03/13/2020 03/07/2019 03/07/2019 09/19/2018   Falls in the past year? 1 1 0 0 0  Comment tripped over vacuum cord tripped over feet - - -  Number falls in past yr: 0 1 - - -  Injury with Fall? 0 0 - - -  Risk for fall due to : Medication side effect History of fall(s);Medication side effect Medication side effect - -  Follow up Falls evaluation completed;Education provided;Falls prevention discussed Falls evaluation completed;Education provided;Falls prevention discussed Falls evaluation completed;Education provided;Falls prevention discussed - -    FALL RISK PREVENTION PERTAINING TO THE HOME:  Any stairs in or around the home? Yes  If so, are there any without handrails? No  Home free of loose throw rugs in walkways, pet beds, electrical cords, etc? Yes  Adequate lighting in your home to reduce risk of falls? Yes   ASSISTIVE DEVICES UTILIZED TO PREVENT FALLS:  Life alert? No  Use of a cane, walker or w/c? No  Grab bars in the bathroom? Yes  Shower chair or bench in shower? Yes  Elevated toilet seat or a handicapped toilet? No   TIMED UP AND GO:  Was the test performed? No .    Gait steady and fast without use of assistive device  Cognitive Function:     6CIT Screen 12/31/2020 03/13/2020 03/07/2019  What Year? 0 points 0 points 0 points  What month? 0 points 0 points 0 points  What time? 0 points 0 points 0 points  Count back from 20 0 points 0 points 0 points  Months in reverse 0 points 0 points 4 points  Repeat phrase 6 points 6 points 8 points  Total Score '6 6 12    ' Immunizations Immunization History  Administered Date(s) Administered   Fluad Quad(high Dose 65+) 12/25/2018   Influenza, High Dose Seasonal PF 12/18/2019   PFIZER(Purple Top)SARS-COV-2 Vaccination 05/03/2019, 05/25/2019, 02/01/2020   Pneumococcal Conjugate-13 12/19/2017   Pneumococcal Polysaccharide-23 12/25/2018    TDAP status: Up to  date  Flu Vaccine status: Due, Education has been provided regarding the importance of this vaccine. Advised  may receive this vaccine at local pharmacy or Health Dept. Aware to provide a copy of the vaccination record if obtained from local pharmacy or Health Dept. Verbalized acceptance and understanding.  Pneumococcal vaccine status: Up to date  Covid-19 vaccine status: Completed vaccines  Qualifies for Shingles Vaccine? Yes   Zostavax completed No   Shingrix Completed?: No.    Education has been provided regarding the importance of this vaccine. Patient has been advised to call insurance company to determine out of pocket expense if they have not yet received this vaccine. Advised may also receive vaccine at local pharmacy or Health Dept. Verbalized acceptance and understanding.  Screening Tests Health Maintenance  Topic Date Due   Zoster Vaccines- Shingrix (1 of 2) Never done   COVID-19 Vaccine (4 - Booster for Pfizer series) 04/25/2020   OPHTHALMOLOGY EXAM  10/08/2020   INFLUENZA VACCINE  11/03/2020   FOOT EXAM  03/13/2021   HEMOGLOBIN A1C  04/16/2021   TETANUS/TDAP  01/10/2023   DEXA SCAN  Completed   Hepatitis C Screening  Completed   HPV VACCINES  Aged Out    Health Maintenance  Health Maintenance Due  Topic Date Due   Zoster Vaccines- Shingrix (1 of 2) Never done   COVID-19 Vaccine (4 - Booster for Pfizer series) 04/25/2020   OPHTHALMOLOGY EXAM  10/08/2020   INFLUENZA VACCINE  11/03/2020    Colorectal cancer screening: Type of screening: Cologuard. Completed 05/27/2019. Repeat every 3 years  Mammogram status: Completed 05/27/2020. Repeat every year  Bone Density status: Completed 04/04/2019. Results reflect: Bone density results: OSTEOPENIA. Repeat every 3 years.  Lung Cancer Screening: (Low Dose CT Chest recommended if Age 80-80 years, 30 pack-year currently smoking OR have quit w/in 15years.) does not qualify.   Lung Cancer Screening Referral: no  Additional Screening:  Hepatitis C Screening: does qualify; Completed 05/15/2018  Vision Screening: Recommended annual  ophthalmology exams for early detection of glaucoma and other disorders of the eye. Is the patient up to date with their annual eye exam?  No  Who is the provider or what is the name of the office in which the patient attends annual eye exams? Dr. Katy Fitch  If pt is not established with a provider, would they like to be referred to a provider to establish care? No .   Dental Screening: Recommended annual dental exams for proper oral hygiene  Community Resource Referral / Chronic Care Management: CRR required this visit?  No   CCM required this visit?  No      Plan:     I have personally reviewed and noted the following in the patient's chart:   Medical and social history Use of alcohol, tobacco or illicit drugs  Current medications and supplements including opioid prescriptions.  Functional ability and status Nutritional status Physical activity Advanced directives List of other physicians Hospitalizations, surgeries, and ER visits in previous 12 months Vitals Screenings to include cognitive, depression, and falls Referrals and appointments  In addition, I have reviewed and discussed with patient certain preventive protocols, quality metrics, and best practice recommendations. A written personalized care plan for preventive services as well as general preventive health recommendations were provided to patient.     Kellie Simmering, LPN   [id number]   Nurse Notes:

## 2021-02-10 ENCOUNTER — Ambulatory Visit (HOSPITAL_COMMUNITY): Payer: Medicare HMO

## 2021-02-16 ENCOUNTER — Other Ambulatory Visit: Payer: Self-pay

## 2021-02-16 MED ORDER — ACCU-CHEK SOFTCLIX LANCETS MISC: 3 refills | Status: DC

## 2021-02-16 MED ORDER — ACCU-CHEK GUIDE VI STRP: ORAL_STRIP | 3 refills | Status: AC

## 2021-02-16 MED ORDER — ACCU-CHEK GUIDE W/DEVICE KIT: PACK | 2 refills | Status: DC

## 2021-03-10 ENCOUNTER — Other Ambulatory Visit: Payer: Self-pay

## 2021-03-10 ENCOUNTER — Ambulatory Visit (HOSPITAL_COMMUNITY)
Admission: RE | Admit: 2021-03-10 | Discharge: 2021-03-10 | Disposition: A | Discharge status: Home / Self Care | Payer: Medicare HMO | Source: Ambulatory Visit | Attending: Internal Medicine | Admitting: Internal Medicine

## 2021-03-10 DIAGNOSIS — I6523 Occlusion and stenosis of bilateral carotid arteries: Secondary | ICD-10-CM | POA: Diagnosis not present

## 2021-03-19 ENCOUNTER — Ambulatory Visit: Payer: Medicare HMO

## 2021-03-19 ENCOUNTER — Ambulatory Visit: Payer: Medicare HMO | Admitting: Internal Medicine

## 2021-03-25 ENCOUNTER — Encounter: Payer: Self-pay | Admitting: Internal Medicine

## 2021-03-25 ENCOUNTER — Other Ambulatory Visit: Payer: Self-pay

## 2021-03-25 ENCOUNTER — Ambulatory Visit (INDEPENDENT_AMBULATORY_CARE_PROVIDER_SITE_OTHER): Payer: Medicare HMO | Admitting: Internal Medicine

## 2021-03-25 VITALS — BP 136/82 | HR 62 | Temp 98.1°F | Ht 60.6 in | Wt 148.8 lb

## 2021-03-25 DIAGNOSIS — E1165 Type 2 diabetes mellitus with hyperglycemia: Secondary | ICD-10-CM | POA: Diagnosis not present

## 2021-03-25 DIAGNOSIS — I10 Essential (primary) hypertension: Secondary | ICD-10-CM | POA: Diagnosis not present

## 2021-03-25 DIAGNOSIS — Z6828 Body mass index (BMI) 28.0-28.9, adult: Secondary | ICD-10-CM | POA: Diagnosis not present

## 2021-03-25 DIAGNOSIS — Z0000 Encounter for general adult medical examination without abnormal findings: Secondary | ICD-10-CM | POA: Diagnosis not present

## 2021-03-25 DIAGNOSIS — E663 Overweight: Secondary | ICD-10-CM | POA: Diagnosis not present

## 2021-03-25 DIAGNOSIS — E7800 Pure hypercholesterolemia, unspecified: Secondary | ICD-10-CM

## 2021-03-25 LAB — POCT URINALYSIS DIPSTICK
Bilirubin, UA: NEGATIVE
Blood, UA: NEGATIVE
Glucose, UA: NEGATIVE
Ketones, UA: NEGATIVE
Nitrite, UA: NEGATIVE
Protein, UA: NEGATIVE
Spec Grav, UA: 1.02 (ref 1.010–1.025)
Urobilinogen, UA: 0.2 E.U./dL
pH, UA: 7.5 (ref 5.0–8.0)

## 2021-03-25 LAB — POCT UA - MICROALBUMIN
Albumin/Creatinine Ratio, Urine, POC: <30
Creatinine, POC: 200 mg/dL
Microalbumin Ur, POC: 30 mg/L

## 2021-03-25 NOTE — Patient Instructions (Signed)

## 2021-03-25 NOTE — Progress Notes (Signed)
Rich Brave Llittleton,acting as a Education administrator for Maximino Greenland, MD.,have documented all relevant documentation on the behalf of Maximino Greenland, MD,as directed by  Maximino Greenland, MD while in the presence of Maximino Greenland, MD.  This visit occurred during the SARS-CoV-2 public health emergency.  Safety protocols were in place, including screening questions prior to the visit, additional usage of staff PPE, and extensive cleaning of exam room while observing appropriate contact time as indicated for disinfecting solutions.  Subjective:     Patient ID: Virginia Russell , female    DOB: [birth year] , 75 y.o.   MRN: [id number]   Chief Complaint  Patient presents with   Annual Exam   Diabetes   Hypertension    HPI  She is here today for a full physical examination. She is no longer followed by GYN. She has no specific concerns or complaints at this time.   Diabetes She presents for her follow-up diabetic visit. She has type 2 diabetes mellitus. Her disease course has been stable. There are no hypoglycemic associated symptoms. Pertinent negatives for diabetes include no blurred vision and no chest pain. There are no hypoglycemic complications. Risk factors for coronary artery disease include dyslipidemia, diabetes mellitus, hypertension, post-menopausal and stress. She is compliant with treatment most of the time. She is following a diabetic diet. She participates in exercise intermittently. Her breakfast blood glucose is taken between 7-8 am. Her breakfast blood glucose range is generally 90-110 mg/dl. An ACE inhibitor/angiotensin II receptor blocker is being taken. Eye exam is current.  Hypertension This is a chronic problem. The current episode started more than 1 year ago. The problem has been gradually improving since onset. The problem is controlled. Pertinent negatives include no blurred vision, chest pain, palpitations or shortness of breath. Past treatments include ACE inhibitors and  diuretics. The current treatment provides moderate improvement.    Past Medical History:  Diagnosis Date   Anxiety    Aortic stenosis, mild 02/11/2020   Mean gradient 9 mmHg on 11/2019   Carotid stenosis, bilateral 02/11/2020   Diabetes mellitus without complication (HCC)    High cholesterol    HTN (hypertension)    Hyperlipidemia LDL goal <70 02/11/2020     Family History  Problem Relation Age of Onset   Breast cancer Mother    Heart disease Mother    Heart attack Mother    Stroke Father    Heart attack Father    Heart attack Daughter    Heart attack Brother    Heart attack Maternal Grandmother    Heart attack Paternal Grandmother      Current Outpatient Medications:    Alcohol Swabs (B-D SINGLE USE SWABS BUTTERFLY) PADS, 100 Pieces by Does not apply route as needed., Disp: 100 each, Rfl: 3   aspirin 81 MG tablet, Take 81 mg by mouth daily., Disp: , Rfl:    atorvastatin (LIPITOR) 40 MG tablet, TAKE 1 TABLET EVERY NIGHT, Disp: 90 tablet, Rfl: 1   celecoxib (CELEBREX) 200 MG capsule, Take 1 capsule (200 mg total) by mouth 2 (two) times daily., Disp: 90 capsule, Rfl: 2   losartan (COZAAR) 50 MG tablet, TAKE 1 TABLET EVERY DAY, Disp: 90 tablet, Rfl: 1   metFORMIN (GLUCOPHAGE-XR) 750 MG 24 hr tablet, TAKE 2 TABLETS EVERY EVENING, Disp: 180 tablet, Rfl: 1   sitaGLIPtin (JANUVIA) 100 MG tablet, Take 100 mg by mouth daily., Disp: , Rfl:    venlafaxine (EFFEXOR) 37.5 MG tablet, TAKE 1 TABLET EVERY  DAY, Disp: 90 tablet, Rfl: 1   Accu-Chek Softclix Lancets lancets, Use as instructed to check blood sugars 1 time per day dx: e11.9, Disp: 150 each, Rfl: 3   benzonatate (TESSALON) 100 MG capsule, Take 1 capsule (100 mg total) by mouth 3 (three) times daily. (Patient not taking: Reported on 12/31/2020), Disp: 30 capsule, Rfl: 1   Blood Glucose Monitoring Suppl (ACCU-CHEK AVIVA PLUS) w/Device KIT, Use as directed to check blood sugars 1 time per day dx: e11.22, Disp: 1 kit, Rfl: 1   Blood Glucose  Monitoring Suppl (ACCU-CHEK GUIDE) w/Device KIT, Use to check blood sugars once daily E11.9, Disp: 1 kit, Rfl: 2   glucose blood (ACCU-CHEK AVIVA PLUS) test strip, Use as instructed to check blood sugars 1 time per day dx: e11.22, Disp: 150 each, Rfl: 3   glucose blood (ACCU-CHEK GUIDE) test strip, Use as instructed to check blood sugars once daily E11.9, Disp: 100 each, Rfl: 3   Allergies  Allergen Reactions   Codeine       The patient states she uses post menopausal status for birth control. Last LMP was No LMP recorded. Patient has had a hysterectomy.. Negative for Dysmenorrhea. Negative for: breast discharge, breast lump(s), breast pain and breast self exam. Associated symptoms include abnormal vaginal bleeding. Pertinent negatives include abnormal bleeding (hematology), anxiety, decreased libido, depression, difficulty falling sleep, dyspareunia, history of infertility, nocturia, sexual dysfunction, sleep disturbances, urinary incontinence, urinary urgency, vaginal discharge and vaginal itching. 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   . The patient's tobacco use is:  Social History   Tobacco Use  Smoking Status Never  Smokeless Tobacco Never  . She has been exposed to passive smoke. The patient's alcohol use is:  Social History   Substance and Sexual Activity  Alcohol Use No   Review of Systems  Constitutional: Negative.   HENT: Negative.    Eyes: Negative.  Negative for blurred vision.  Respiratory: Negative.  Negative for shortness of breath.   Cardiovascular: Negative.  Negative for chest pain and palpitations.  Gastrointestinal: Negative.   Endocrine: Negative.   Genitourinary: Negative.   Musculoskeletal: Negative.   Skin: Negative.   Allergic/Immunologic: Negative.   Neurological: Negative.   Hematological: Negative.   Psychiatric/Behavioral: Negative.      Today's Vitals   03/25/21 0847  BP: 136/82   Pulse: 62  Temp: 98.1 F (36.7 C)  Weight: 148 lb 12.8 oz (67.5 kg)  Height: 5' 0.6" (1.539 m)  PainSc: 0-No pain   Body mass index is 28.49 kg/m.  Wt Readings from Last 3 Encounters:  03/25/21 148 lb 12.8 oz (67.5 kg)  12/31/20 147 lb 6.4 oz (66.9 kg)  10/14/20 149 lb 12.8 oz (67.9 kg)    BP Readings from Last 3 Encounters:  03/25/21 136/82  12/31/20 130/64  10/14/20 138/80    Objective:  Physical Exam Vitals and nursing note reviewed.  Constitutional:      Appearance: Normal appearance.  HENT:     Head: Normocephalic and atraumatic.     Right Ear: Tympanic membrane, ear canal and external ear normal.     Left Ear: Tympanic membrane, ear canal and external ear normal.     Nose:     Comments: Masked     Mouth/Throat:     Comments: Masked  Eyes:     Extraocular Movements: Extraocular movements intact.     Conjunctiva/sclera: Conjunctivae normal.     Pupils:  Pupils are equal, round, and reactive to light.  Cardiovascular:     Rate and Rhythm: Normal rate and regular rhythm.     Pulses: Normal pulses.     Heart sounds: Normal heart sounds.  Pulmonary:     Effort: Pulmonary effort is normal.     Breath sounds: Normal breath sounds.  Chest:  Breasts:    Tanner Score is 5.     Right: Normal.     Left: Normal.  Abdominal:     General: Abdomen is flat. Bowel sounds are normal.     Palpations: Abdomen is soft.  Genitourinary:    Comments: deferred Musculoskeletal:        General: Normal range of motion.     Cervical back: Normal range of motion and neck supple.  Feet:     Comments: She declined foot exam, she did not want to remove shoes/socks.  Skin:    General: Skin is warm and dry.  Neurological:     General: No focal deficit present.     Mental Status: She is alert and oriented to person, place, and time.  Psychiatric:        Mood and Affect: Mood normal.        Behavior: Behavior normal.        Assessment And Plan:     1. Encounter for general  adult medical examination w/o abnormal findings Comments: A full exam was performed. Importance of monthly self breast exams was discussed with the patient. PATIENT IS ADVISED TO GET 30-45 MINUTES REGULAR EXERCISE NO LESS THAN FOUR TO FIVE DAYS PER WEEK - BOTH WEIGHTBEARING EXERCISES AND AEROBIC ARE RECOMMENDED.  PATIENT IS ADVISED TO FOLLOW A HEALTHY DIET WITH AT LEAST SIX FRUITS/VEGGIES PER DAY, DECREASE INTAKE OF RED MEAT, AND TO INCREASE FISH INTAKE TO TWO DAYS PER WEEK.  MEATS/FISH SHOULD NOT BE FRIED, BAKED OR BROILED IS PREFERABLE.  IT IS ALSO IMPORTANT TO CUT BACK ON YOUR SUGAR INTAKE. PLEASE AVOID ANYTHING WITH ADDED SUGAR, CORN SYRUP OR OTHER SWEETENERS. IF YOU MUST USE A SWEETENER, YOU CAN TRY STEVIA. IT IS ALSO IMPORTANT TO AVOID ARTIFICIALLY SWEETENERS AND DIET BEVERAGES. LASTLY, I SUGGEST WEARING SPF 50 SUNSCREEN ON EXPOSED PARTS AND ESPECIALLY WHEN IN THE DIRECT SUNLIGHT FOR AN EXTENDED PERIOD OF TIME.  PLEASE AVOID FAST FOOD RESTAURANTS AND INCREASE YOUR WATER INTAKE.   2. Uncontrolled type 2 diabetes mellitus with hyperglycemia (HCC) Comments: Diabetic foot exam NOT performed, per patients' request. She will rto in 4 months for re-evaluation. I DISCUSSED WITH THE PATIENT AT LENGTH REGARDING THE GOALS OF GLYCEMIC CONTROL AND POSSIBLE LONG-TERM COMPLICATIONS.  I  ALSO STRESSED THE IMPORTANCE OF COMPLIANCE WITH HOME GLUCOSE MONITORING, DIETARY RESTRICTIONS INCLUDING AVOIDANCE OF SUGARY DRINKS/PROCESSED FOODS,  ALONG WITH REGULAR EXERCISE.  I  ALSO STRESSED THE IMPORTANCE OF ANNUAL EYE EXAMS, SELF FOOT CARE AND COMPLIANCE WITH OFFICE VISITS.  - POCT Urinalysis Dipstick (81002) - POCT UA - Microalbumin - Hemoglobin A1c - CBC - CMP14+EGFR - Lipid panel - TSH  3. Essential hypertension, benign Comments: Chronic, fair control. Goal BP <130/80. EKG performed, NSR w/ ectopic ventricular beat. NO med changes. She is encouraged to follow low sodium diet. - EKG 12-Lead  4. Pure  hypercholesterolemia Comments: She is currently on atorvastatin 80m daily. Goal LDL < 70. She is encouraged to follow heart healthy diet, lead active lifestyle and practice stress mgmt.  - Lipid panel - TSH  5. Overweight with body mass index (BMI) of 28 to 28.9  in adult Comments: Her BMI is acceptable for her demographic. Encouraged to gradually incorporate more activity into her daily routine OUTSIDE of work, aiming for 123mn/wk.   Patient was given opportunity to ask questions. Patient verbalized understanding of the plan and was able to repeat key elements of the plan. All questions were answered to their satisfaction.   I, RMaximino Greenland MD, have reviewed all documentation for this visit. The documentation on 04/02/21 for the exam, diagnosis, procedures, and orders are all accurate and complete.   THE PATIENT IS ENCOURAGED TO PRACTICE SOCIAL DISTANCING DUE TO THE COVID-19 PANDEMIC.

## 2021-03-26 LAB — CMP14+EGFR
ALT: 14 IU/L (ref 0–32)
AST: 17 IU/L (ref 0–40)
Albumin/Globulin Ratio: 2.1 (ref 1.2–2.2)
Albumin: 4.6 g/dL (ref 3.7–4.7)
Alkaline Phosphatase: 76 IU/L (ref 44–121)
BUN/Creatinine Ratio: 37 — ABNORMAL HIGH (ref 12–28)
BUN: 25 mg/dL (ref 8–27)
Bilirubin Total: 0.9 mg/dL (ref 0.0–1.2)
CO2: 23 mmol/L (ref 20–29)
Calcium: 9.5 mg/dL (ref 8.7–10.3)
Chloride: 100 mmol/L (ref 96–106)
Creatinine, Ser: 0.67 mg/dL (ref 0.57–1.00)
Globulin, Total: 2.2 g/dL (ref 1.5–4.5)
Glucose: 159 mg/dL — ABNORMAL HIGH (ref 70–99)
Potassium: 4.9 mmol/L (ref 3.5–5.2)
Sodium: 143 mmol/L (ref 134–144)
Total Protein: 6.8 g/dL (ref 6.0–8.5)
eGFR: 91 mL/min/{1.73_m2} (ref >59)

## 2021-03-26 LAB — LIPID PANEL
Chol/HDL Ratio: 2.5 ratio (ref 0.0–4.4)
Cholesterol, Total: 168 mg/dL (ref 100–199)
HDL: 68 mg/dL (ref >39)
LDL Chol Calc (NIH): 86 mg/dL (ref 0–99)
Triglycerides: 74 mg/dL (ref 0–149)
VLDL Cholesterol Cal: 14 mg/dL (ref 5–40)

## 2021-03-26 LAB — CBC
Hematocrit: 40.1 % (ref 34.0–46.6)
Hemoglobin: 13.2 g/dL (ref 11.1–15.9)
MCH: 31 pg (ref 26.6–33.0)
MCHC: 32.9 g/dL (ref 31.5–35.7)
MCV: 94 fL (ref 79–97)
Platelets: 166 10*3/uL (ref 150–450)
RBC: 4.26 x10E6/uL (ref 3.77–5.28)
RDW: 12 % (ref 11.7–15.4)
WBC: 6.2 10*3/uL (ref 3.4–10.8)

## 2021-03-26 LAB — HEMOGLOBIN A1C
Est. average glucose Bld gHb Est-mCnc: 189 mg/dL
Hgb A1c MFr Bld: 8.2 % — ABNORMAL HIGH (ref 4.8–5.6)

## 2021-03-26 LAB — TSH: TSH: 2.48 u[IU]/mL (ref 0.450–4.500)

## 2021-04-22 ENCOUNTER — Other Ambulatory Visit: Payer: Self-pay | Admitting: Internal Medicine

## 2021-07-04 ENCOUNTER — Other Ambulatory Visit: Payer: Self-pay | Admitting: Internal Medicine

## 2021-07-06 DIAGNOSIS — M170 Bilateral primary osteoarthritis of knee: Secondary | ICD-10-CM | POA: Diagnosis not present

## 2021-11-11 ENCOUNTER — Ambulatory Visit (INDEPENDENT_AMBULATORY_CARE_PROVIDER_SITE_OTHER): Payer: Medicare HMO | Admitting: Internal Medicine

## 2021-11-11 ENCOUNTER — Encounter: Payer: Self-pay | Admitting: Internal Medicine

## 2021-11-11 VITALS — BP 160/100 | HR 76 | Temp 97.8°F | Ht 60.6 in | Wt 144.6 lb

## 2021-11-11 DIAGNOSIS — D692 Other nonthrombocytopenic purpura: Secondary | ICD-10-CM | POA: Diagnosis not present

## 2021-11-11 DIAGNOSIS — Z79899 Other long term (current) drug therapy: Secondary | ICD-10-CM | POA: Diagnosis not present

## 2021-11-11 DIAGNOSIS — R202 Paresthesia of skin: Secondary | ICD-10-CM | POA: Diagnosis not present

## 2021-11-11 DIAGNOSIS — F3341 Major depressive disorder, recurrent, in partial remission: Secondary | ICD-10-CM | POA: Diagnosis not present

## 2021-11-11 DIAGNOSIS — Z6827 Body mass index (BMI) 27.0-27.9, adult: Secondary | ICD-10-CM

## 2021-11-11 DIAGNOSIS — I10 Essential (primary) hypertension: Secondary | ICD-10-CM

## 2021-11-11 DIAGNOSIS — Z23 Encounter for immunization: Secondary | ICD-10-CM | POA: Diagnosis not present

## 2021-11-11 DIAGNOSIS — E1165 Type 2 diabetes mellitus with hyperglycemia: Secondary | ICD-10-CM

## 2021-11-11 MED ORDER — AMLODIPINE BESYLATE 5 MG PO TABS: ORAL_TABLET | ORAL | 11 refills | Status: DC

## 2021-11-11 NOTE — Patient Instructions (Signed)

## 2021-11-11 NOTE — Progress Notes (Signed)
Rich Brave Llittleton,acting as a Education administrator for Virginia Greenland, MD.,have documented all relevant documentation on the behalf of Virginia Greenland, MD,as directed by  Virginia Greenland, MD while in the presence of Virginia Greenland, MD.    Subjective:     Patient ID: Virginia Russell, Virginia Russell , Virginia Russell    DOB: 01-Jan-1946 , 76 y.o.   MRN: [id number]   Chief Complaint  Patient presents with  . Diabetes  . Hypertension    HPI  The patient is here today for a follow-up on her diabetes and blood pressure.  She reports compliance with meds. Unfortunately, her last appt was in December 2022 for CPE.   Diabetes She presents for her follow-up diabetic visit. She has type 2 diabetes mellitus. No MedicAlert identification noted. The initial diagnosis of diabetes was made 1 year ago. Her disease course has been stable. There are no hypoglycemic associated symptoms. Pertinent negatives for diabetes include no blurred vision, no polydipsia, no polyphagia and no polyuria. There are no hypoglycemic complications. Symptoms are stable. Risk factors for coronary artery disease include dyslipidemia, diabetes mellitus, hypertension, post-menopausal and stress. She is compliant with treatment all of the time. She is following a diabetic diet. She participates in exercise intermittently. Her breakfast blood glucose is taken between 7-8 am. Her breakfast blood glucose range is generally 90-110 mg/dl. An ACE inhibitor/angiotensin II receptor blocker is being taken. Eye exam is current.  Hypertension This is a chronic problem. The current episode started more than 1 year ago. The problem has been gradually improving since onset. The problem is controlled. Pertinent negatives include no blurred vision or palpitations. Past treatments include ACE inhibitors and diuretics. The current treatment provides moderate improvement.     Past Medical History:  Diagnosis Date  . Anxiety   . Aortic stenosis, mild 02/11/2020   Mean gradient 9  mmHg on 11/2019  . Carotid stenosis, bilateral 02/11/2020  . Diabetes mellitus without complication ([place])   . High cholesterol   . HTN (hypertension)   . Hyperlipidemia LDL goal <70 02/11/2020     Family History  Problem Relation Age of Onset  . Breast cancer Mother   . Heart disease Mother   . Heart attack Mother   . Stroke Father   . Heart attack Father   . Heart attack Daughter   . Heart attack Brother   . Heart attack Maternal Grandmother   . Heart attack Paternal Grandmother      Current Outpatient Medications:  .  Accu-Chek Softclix Lancets lancets, Use as instructed to check blood sugars 1 time per day dx: e11.9, Disp: 150 each, Rfl: 3 .  Alcohol Swabs (B-D SINGLE USE SWABS BUTTERFLY) PADS, 100 Pieces by Does not apply route as needed., Disp: 100 each, Rfl: 3 .  aspirin 81 MG tablet, Take 81 mg by mouth daily., Disp: , Rfl:  .  atorvastatin (LIPITOR) 40 MG tablet, TAKE 1 TABLET EVERY NIGHT, Disp: 90 tablet, Rfl: 1 .  Blood Glucose Monitoring Suppl (ACCU-CHEK AVIVA PLUS) w/Device KIT, Use as directed to check blood sugars 1 time per day dx: e11.22, Disp: 1 kit, Rfl: 1 .  Blood Glucose Monitoring Suppl (ACCU-CHEK GUIDE) w/Device KIT, Use to check blood sugars once daily E11.9, Disp: 1 kit, Rfl: 2 .  celecoxib (CELEBREX) 200 MG capsule, Take 1 capsule (200 mg total) by mouth 2 (two) times daily., Disp: 90 capsule, Rfl: 2 .  glucose blood (ACCU-CHEK AVIVA PLUS) test strip, Use as instructed to check  blood sugars 1 time per day dx: e11.22, Disp: 150 each, Rfl: 3 .  glucose blood (ACCU-CHEK GUIDE) test strip, Use as instructed to check blood sugars once daily E11.9, Disp: 100 each, Rfl: 3 .  losartan (COZAAR) 50 MG tablet, TAKE 1 TABLET EVERY DAY, Disp: 90 tablet, Rfl: 1 .  metFORMIN (GLUCOPHAGE-XR) 750 MG 24 hr tablet, TAKE 2 TABLETS EVERY EVENING, Disp: 180 tablet, Rfl: 1 .  sitaGLIPtin (JANUVIA) 100 MG tablet, Take 100 mg by mouth daily., Disp: , Rfl:  .  venlafaxine (EFFEXOR)  37.5 MG tablet, TAKE 1 TABLET EVERY DAY, Disp: 90 tablet, Rfl: 1 .  benzonatate (TESSALON) 100 MG capsule, Take 1 capsule (100 mg total) by mouth 3 (three) times daily. (Patient not taking: Reported on 11/11/2021), Disp: 30 capsule, Rfl: 1   Allergies  Allergen Reactions  . Codeine      Review of Systems  Constitutional: Negative.   Eyes: Negative.  Negative for blurred vision.  Respiratory: Negative.    Cardiovascular: Negative.  Negative for palpitations.  Endocrine: Negative for polydipsia, polyphagia and polyuria.  Musculoskeletal: Negative.   Skin: Negative.   Neurological: Negative.   Psychiatric/Behavioral: Negative.       Today's Vitals   11/11/21 0858  BP: (!) 140/76  Pulse: 76  Temp: 97.8 F (36.6 C)  Weight: 144 lb 9.6 oz (65.6 kg)  Height: 5' 0.6" (1.539 m)  PainSc: 0-No pain   Body mass index is 27.68 kg/m.  Wt Readings from Last 3 Encounters:  11/11/21 144 lb 9.6 oz (65.6 kg)  03/25/21 148 lb 12.8 oz (67.5 kg)  12/31/20 147 lb 6.4 oz (66.9 kg)     Objective:  Physical Exam Vitals and nursing note reviewed.  Constitutional:      Appearance: Normal appearance.  HENT:     Head: Normocephalic and atraumatic.  Cardiovascular:     Rate and Rhythm: Normal rate and regular rhythm.     Heart sounds: Murmur heard.  Pulmonary:     Effort: Pulmonary effort is normal.     Breath sounds: Normal breath sounds.  Musculoskeletal:     Cervical back: Normal range of motion.  Skin:    General: Skin is warm.  Neurological:     General: No focal deficit present.     Mental Status: She is alert.  Psychiatric:        Mood and Affect: Mood normal.        Behavior: Behavior normal.     Assessment And Plan:     1. Uncontrolled type 2 diabetes mellitus with hyperglycemia (HCC) Comments: Chronic, diabetic foot exam was performed. I will check labs as below. Encouraged to keep all upcoming appts. She will f/u in 3 months  - Hemoglobin A1c - Lipid panel -  CMP14+EGFR  2. Essential hypertension, benign Comments: Chronic, uncontrolled. Repeat BP .  - CBC no Diff  3. Recurrent major depressive disorder, in partial remission ([place])  4. Right hand paresthesia  5. Senile purpura ([place]) Comments: She was educated on sun protection measures, including sunscreen application and sun-protective clothing to protect her skin from further photodamage.  6. Immunization due - Varicella-zoster vaccine IM  7. BMI 27.0-27.9,adult Comments: She is encouraged to aim for at least 150 minutes of exercise per week.   8. Drug therapy - Vitamin B12     Patient was given opportunity to ask questions. Patient verbalized understanding of the plan and was able to repeat key elements of the plan. All questions were  answered to their satisfaction.   I, Virginia Greenland, MD, have reviewed all documentation for this visit. The documentation on 11/11/21 for the exam, diagnosis, procedures, and orders are all accurate and complete.   IF YOU HAVE BEEN REFERRED TO A SPECIALIST, IT MAY TAKE 1-2 WEEKS TO SCHEDULE/PROCESS THE REFERRAL. IF YOU HAVE NOT HEARD FROM US/SPECIALIST IN TWO WEEKS, PLEASE GIVE [place] A CALL AT [phone].   THE PATIENT IS ENCOURAGED TO PRACTICE SOCIAL DISTANCING DUE TO THE COVID-19 PANDEMIC.

## 2021-11-12 LAB — CMP14+EGFR
ALT: 15 IU/L (ref 0–32)
AST: 20 IU/L (ref 0–40)
Albumin/Globulin Ratio: 1.8 (ref 1.2–2.2)
Albumin: 4.3 g/dL (ref 3.8–4.8)
Alkaline Phosphatase: 69 IU/L (ref 44–121)
BUN/Creatinine Ratio: 34 — ABNORMAL HIGH (ref 12–28)
BUN: 25 mg/dL (ref 8–27)
Bilirubin Total: 0.6 mg/dL (ref 0.0–1.2)
CO2: 23 mmol/L (ref 20–29)
Calcium: 9.4 mg/dL (ref 8.7–10.3)
Chloride: 100 mmol/L (ref 96–106)
Creatinine, Ser: 0.74 mg/dL (ref 0.57–1.00)
Globulin, Total: 2.4 g/dL (ref 1.5–4.5)
Glucose: 160 mg/dL — ABNORMAL HIGH (ref 70–99)
Potassium: 4.4 mmol/L (ref 3.5–5.2)
Sodium: 138 mmol/L (ref 134–144)
Total Protein: 6.7 g/dL (ref 6.0–8.5)
eGFR: 84 mL/min/{1.73_m2} (ref >59)

## 2021-11-12 LAB — CBC
Hematocrit: 36.5 % (ref 34.0–46.6)
Hemoglobin: 12.1 g/dL (ref 11.1–15.9)
MCH: 30.4 pg (ref 26.6–33.0)
MCHC: 33.2 g/dL (ref 31.5–35.7)
MCV: 92 fL (ref 79–97)
Platelets: 154 10*3/uL (ref 150–450)
RBC: 3.98 x10E6/uL (ref 3.77–5.28)
RDW: 12.7 % (ref 11.7–15.4)
WBC: 6.1 10*3/uL (ref 3.4–10.8)

## 2021-11-12 LAB — LIPID PANEL
Chol/HDL Ratio: 2.8 ratio (ref 0.0–4.4)
Cholesterol, Total: 145 mg/dL (ref 100–199)
HDL: 52 mg/dL (ref >39)
LDL Chol Calc (NIH): 77 mg/dL (ref 0–99)
Triglycerides: 84 mg/dL (ref 0–149)
VLDL Cholesterol Cal: 16 mg/dL (ref 5–40)

## 2021-11-12 LAB — HEMOGLOBIN A1C
Est. average glucose Bld gHb Est-mCnc: 180 mg/dL
Hgb A1c MFr Bld: 7.9 % — ABNORMAL HIGH (ref 4.8–5.6)

## 2021-11-12 LAB — VITAMIN B12: Vitamin B-12: 651 pg/mL (ref 232–1245)

## 2021-11-23 ENCOUNTER — Other Ambulatory Visit: Payer: Self-pay | Admitting: Nurse Practitioner

## 2021-11-23 DIAGNOSIS — Z1231 Encounter for screening mammogram for malignant neoplasm of breast: Secondary | ICD-10-CM

## 2021-11-24 ENCOUNTER — Ambulatory Visit: Payer: Medicare HMO

## 2021-11-28 ENCOUNTER — Other Ambulatory Visit: Payer: Self-pay | Admitting: Internal Medicine

## 2021-12-16 ENCOUNTER — Ambulatory Visit
Admission: RE | Admit: 2021-12-16 | Discharge: 2021-12-16 | Disposition: A | Discharge status: Home / Self Care | Payer: Medicare HMO | Source: Ambulatory Visit | Attending: Nurse Practitioner | Admitting: Nurse Practitioner

## 2021-12-16 DIAGNOSIS — Z1231 Encounter for screening mammogram for malignant neoplasm of breast: Secondary | ICD-10-CM | POA: Diagnosis not present

## 2022-01-15 DIAGNOSIS — E119 Type 2 diabetes mellitus without complications: Secondary | ICD-10-CM | POA: Diagnosis not present

## 2022-01-15 DIAGNOSIS — H0102B Squamous blepharitis left eye, upper and lower eyelids: Secondary | ICD-10-CM | POA: Diagnosis not present

## 2022-01-15 DIAGNOSIS — H40013 Open angle with borderline findings, low risk, bilateral: Secondary | ICD-10-CM | POA: Diagnosis not present

## 2022-01-15 DIAGNOSIS — H0102A Squamous blepharitis right eye, upper and lower eyelids: Secondary | ICD-10-CM | POA: Diagnosis not present

## 2022-01-15 DIAGNOSIS — H25813 Combined forms of age-related cataract, bilateral: Secondary | ICD-10-CM | POA: Diagnosis not present

## 2022-01-15 DIAGNOSIS — H10413 Chronic giant papillary conjunctivitis, bilateral: Secondary | ICD-10-CM | POA: Diagnosis not present

## 2022-01-15 LAB — HM DIABETES EYE EXAM

## 2022-01-28 ENCOUNTER — Ambulatory Visit: Payer: Medicare HMO | Admitting: Internal Medicine

## 2022-01-28 ENCOUNTER — Ambulatory Visit (INDEPENDENT_AMBULATORY_CARE_PROVIDER_SITE_OTHER): Payer: Medicare HMO

## 2022-01-28 VITALS — Ht 61.0 in | Wt 145.0 lb

## 2022-01-28 DIAGNOSIS — Z0000 Encounter for general adult medical examination without abnormal findings: Secondary | ICD-10-CM

## 2022-01-28 NOTE — Progress Notes (Signed)
Subjective:   Virginia Russell is a 76 y.o. female who presents for Medicare Annual (Subsequent) preventive examination.  Review of Systems     Cardiac Risk Factors include: advanced age (>63mn, >>5women);diabetes mellitus;dyslipidemia;hypertension     Objective:    Today's Vitals   01/28/22 0937  Weight: 145 lb (65.8 kg)  Height: _0  (1.549 m)   Body mass index is 27.4 kg/m.     01/28/2022    9:41 AM 12/31/2020    8:37 AM 03/13/2020    2:16 PM 03/07/2019    4:12 PM  Advanced Directives  Does Patient Have a Medical Advance Directive? Yes Yes Yes Yes  Type of AParamedicof AMacdoelLiving will HPettiboneLiving will HBuck MeadowsLiving will HSharonLiving will  Copy of H[place] Chart? No - copy requested No - copy requested No - copy requested No - copy requested    Current Medications (verified) Outpatient Encounter Medications as of 01/28/2022  Medication Sig   Accu-Chek Softclix Lancets lancets Use as instructed to check blood sugars 1 time per day dx: e11.9   Alcohol Swabs (B-D SINGLE USE SWABS BUTTERFLY) PADS 100 Pieces by Does not apply route as needed.   amLODipine (NORVASC) 5 MG tablet Take one tab po qpm   aspirin 81 MG tablet Take 81 mg by mouth daily.   atorvastatin (LIPITOR) 40 MG tablet TAKE 1 TABLET EVERY NIGHT   Blood Glucose Monitoring Suppl (ACCU-CHEK AVIVA PLUS) w/Device KIT Use as directed to check blood sugars 1 time per day dx: e11.22   Blood Glucose Monitoring Suppl (ACCU-CHEK GUIDE) w/Device KIT Use to check blood sugars once daily E11.9   celecoxib (CELEBREX) 200 MG capsule Take 1 capsule (200 mg total) by mouth 2 (two) times daily.   glucose blood (ACCU-CHEK AVIVA PLUS) test strip Use as instructed to check blood sugars 1 time per day dx: e11.22   glucose blood (ACCU-CHEK GUIDE) test strip Use as instructed to check blood sugars once daily  E11.9   losartan (COZAAR) 50 MG tablet TAKE 1 TABLET EVERY DAY   metFORMIN (GLUCOPHAGE-XR) 750 MG 24 hr tablet TAKE 2 TABLETS EVERY EVENING   sitaGLIPtin (JANUVIA) 100 MG tablet Take 100 mg by mouth daily.   venlafaxine (EFFEXOR) 37.5 MG tablet TAKE 1 TABLET EVERY DAY   benzonatate (TESSALON) 100 MG capsule Take 1 capsule (100 mg total) by mouth 3 (three) times daily. (Patient not taking: Reported on 11/11/2021)   No facility-administered encounter medications on file as of 01/28/2022.    Allergies (verified) Codeine   History: Past Medical History:  Diagnosis Date   Anxiety    Aortic stenosis, mild 02/11/2020   Mean gradient 9 mmHg on 11/2019   Carotid stenosis, bilateral 02/11/2020   Diabetes mellitus without complication (HCC)    High cholesterol    HTN (hypertension)    Hyperlipidemia LDL goal <70 02/11/2020   Past Surgical History:  Procedure Laterality Date   ABDOMINAL HYSTERECTOMY     ABDOMINAL HYSTERECTOMY   BACK SURGERY     BREAST BIOPSY Left    NECK SURGERY     SHOULDER SURGERY     X2   Family History  Problem Relation Age of Onset   Breast cancer Mother    Heart disease Mother    Heart attack Mother    Stroke Father    Heart attack Father    Heart attack Daughter  Heart attack Brother    Heart attack Maternal Grandmother    Heart attack Paternal Grandmother    Social History   Socioeconomic History   Marital status: Married    Spouse name: Not on file   Number of children: 3   Years of education: Not on file   Highest education level: Not on file  Occupational History   Occupation: house cleaner  Tobacco Use   Smoking status: Never   Smokeless tobacco: Never  Vaping Use   Vaping Use: Never used  Substance and Sexual Activity   Alcohol use: No   Drug use: Never   Sexual activity: Not Currently  Other Topics Concern   Not on file  Social History Narrative   Not on file   Social Determinants of Health   Financial Resource Strain: Low Risk   (01/28/2022)   Overall Financial Resource Strain (CARDIA)    Difficulty of Paying Living Expenses: Not hard at all  Food Insecurity: No Food Insecurity (01/28/2022)   Hunger Vital Sign    Worried About Running Out of Food in the Last Year: Never true    Momence in the Last Year: Never true  Transportation Needs: No Transportation Needs (01/28/2022)   PRAPARE - Hydrologist (Medical): No    Lack of Transportation (Non-Medical): No  Physical Activity: Inactive (01/28/2022)   Exercise Vital Sign    Days of Exercise per Week: 0 days    Minutes of Exercise per Session: 0 min  Stress: No Stress Concern Present (01/28/2022)   [place]    Feeling of Stress : Not at all  Social Connections: Not on file    Tobacco Counseling Counseling given: Not Answered   Clinical Intake:  Pre-visit preparation completed: Yes  Pain : No/denies pain     Nutritional Status: BMI 25 -29 Overweight Nutritional Risks: None Diabetes: Yes  How often do you need to have someone help you when you read instructions, pamphlets, or other written materials from your doctor or pharmacy?: 1 - Never What is the last grade level you completed in school?: 10th grade  Diabetic? Yes Nutrition Risk Assessment:  Has the patient had any N/V/D within the last 2 months?  No  Does the patient have any non-healing wounds?  No  Has the patient had any unintentional weight loss or weight gain?  No   Diabetes:  Is the patient diabetic?  Yes  If diabetic, was a CBG obtained today?  No  Did the patient bring in their glucometer from home?  No  How often do you monitor your CBG's? Every other day.   Financial Strains and Diabetes Management:  Are you having any financial strains with the device, your supplies or your medication? No .  Does the patient want to be seen by Chronic Care Management for management of  their diabetes?  No  Would the patient like to be referred to a Nutritionist or for Diabetic Management?  No   Diabetic Exams:  Diabetic Eye Exam: Completed 01/15/2022 Diabetic Foot Exam: Completed 11/11/2021   Interpreter Needed?: No  Information entered by :: NAllen LPN   Activities of Daily Living    01/28/2022    9:42 AM  In your present state of health, do you have any difficulty performing the following activities:  Hearing? 0  Vision? 0  Difficulty concentrating or making decisions? 0  Walking or climbing stairs? 1  Comment due to knees  Dressing or bathing? 0  Doing errands, shopping? 0  Preparing Food and eating ? N  Using the Toilet? N  In the past six months, have you accidently leaked urine? N  Do you have problems with loss of bowel control? N  Managing your Medications? N  Managing your Finances? N  Housekeeping or managing your Housekeeping? N    Patient Care Team: Glendale Chard, MD as PCP - General (Internal Medicine) Skeet Latch, MD as PCP - Cardiology (Cardiology)  Indicate any recent Medical Services you may have received from other than [facility] providers in the past year (date may be approximate).     Assessment:   This is a routine wellness examination for [place].  Hearing/Vision screen Vision Screening - Comments:: Regular eye exams, [facility]  Dietary issues and exercise activities discussed: Current Exercise Habits: The patient has a physically strenuous job, but has no regular exercise apart from work.   Goals Addressed             This Visit's Progress    Patient Stated       01/28/2022, wants to get sugar down       Depression Screen    01/28/2022    9:42 AM 11/11/2021    8:59 AM 03/25/2021    8:44 AM 12/31/2020    8:38 AM 10/14/2020   11:36 AM 07/15/2020    9:39 AM 03/13/2020    2:29 PM  PHQ 2/9 Scores  PHQ - 2 Score 0 0 0 0 6 0 0  PHQ- 9 Score  0 _0 0    Fall Risk    01/28/2022    9:42 AM 12/31/2020     8:37 AM 03/13/2020    2:16 PM 03/07/2019    4:13 PM 03/07/2019    3:03 PM  Fall Risk   Falls in the past year? _1 0 0  Comment trips when working tripped over vacuum cord tripped over feet    Number falls in past yr: 1 0 1    Injury with Fall? 0 0 0    Risk for fall due to : Medication side effect Medication side effect History of fall(s);Medication side effect Medication side effect   Follow up Falls prevention discussed;Education provided;Falls evaluation completed Falls evaluation completed;Education provided;Falls prevention discussed Falls evaluation completed;Education provided;Falls prevention discussed Falls evaluation completed;Education provided;Falls prevention discussed     FALL RISK PREVENTION PERTAINING TO THE HOME:  Any stairs in or around the home? No  If so, are there any without handrails?  N/a Home free of loose throw rugs in walkways, pet beds, electrical cords, etc? Yes  Adequate lighting in your home to reduce risk of falls? Yes   ASSISTIVE DEVICES UTILIZED TO PREVENT FALLS:  Life alert? No  Use of a cane, walker or w/c? No  Grab bars in the bathroom? Yes  Shower chair or bench in shower? Yes  Elevated toilet seat or a handicapped toilet? No   TIMED UP AND GO:  Was the test performed? No .      Cognitive Function:        01/28/2022    9:43 AM 12/31/2020    8:39 AM 03/13/2020    2:18 PM 03/07/2019    4:15 PM  6CIT Screen  What Year? 0 points 0 points 0 points 0 points  What month? 0 points 0 points 0 points 0 points  What time? 0 points 0  points 0 points 0 points  Count back from 20 0 points 0 points 0 points 0 points  Months in reverse 0 points 0 points 0 points 4 points  Repeat phrase 0 points 6 points 6 points 8 points  Total Score 0 points 6 points 6 points 12 points    Immunizations Immunization History  Administered Date(s) Administered   Fluad Quad(high Dose 65+) 12/25/2018, 01/08/2021   Influenza, High Dose Seasonal PF 12/18/2019    PFIZER(Purple Top)SARS-COV-2 Vaccination 05/03/2019, 05/25/2019, 02/01/2020   Pfizer Covid-19 Vaccine Bivalent Booster 56yr & up 03/04/2021   Pneumococcal Conjugate-13 12/19/2017   Pneumococcal Polysaccharide-23 12/25/2018   Zoster Recombinat (Shingrix) 04/27/2021, 11/11/2021   Zoster, Live 12/21/2013    TDAP status: Up to date  Flu Vaccine status: Up to date  Pneumococcal vaccine status: Up to date  Covid-19 vaccine status: Completed vaccines  Qualifies for Shingles Vaccine? Yes   Zostavax completed Yes   Shingrix Completed?: Yes  Screening Tests Health Maintenance  Topic Date Due   COVID-19 Vaccine (5 - Pfizer risk series) 04/29/2021   INFLUENZA VACCINE  11/03/2021   Medicare Annual Wellness (AWV)  01/30/2022   Diabetic kidney evaluation - Urine ACR  03/25/2022   HEMOGLOBIN A1C  05/14/2022   Diabetic kidney evaluation - GFR measurement  11/12/2022   FOOT EXAM  11/12/2022   TETANUS/TDAP  01/10/2023   OPHTHALMOLOGY EXAM  01/16/2023   Pneumonia Vaccine 76 Years old  Completed   DEXA SCAN  Completed   Hepatitis C Screening  Completed   Zoster Vaccines- Shingrix  Completed   HPV VACCINES  Aged Out   COLONOSCOPY (Pts 45-425yrInsurance coverage will need to be confirmed)  Discontinued   Fecal DNA (Cologuard)  Discontinued    Health Maintenance  Health Maintenance Due  Topic Date Due   COVID-19 Vaccine (5 - Pfizer risk series) 04/29/2021   INFLUENZA VACCINE  11/03/2021   Medicare Annual Wellness (AWV)  01/30/2022    Colorectal cancer screening: No longer required.   Mammogram status: Completed 12/16/2021. Repeat every year  Bone Density status: Completed 04/04/2019.   Lung Cancer Screening: (Low Dose CT Chest recommended if Age 77-80ears, 30 pack-year currently smoking OR have quit w/in 15years.) does not qualify.   Lung Cancer Screening Referral: no  Additional Screening:  Hepatitis C Screening: does qualify; Completed 05/15/2018  Vision Screening:  Recommended annual ophthalmology exams for early detection of glaucoma and other disorders of the eye. Is the patient up to date with their annual eye exam?  Yes  Who is the provider or what is the name of the office in which the patient attends annual [facility] exams? [facility] If pt is not established with a provider, would they like to be referred to a provider to establish care? No .   Dental Screening: Recommended annual dental exams for proper oral hygiene  Community Resource Referral / Chronic Care Management: CRR required this visit?  No   CCM required this visit?  No      Plan:     I have personally reviewed and noted the following in the patient's chart:   Medical and social history Use of alcohol, tobacco or illicit drugs  Current medications and supplements including opioid prescriptions. Patient is not currently taking opioid prescriptions. Functional ability and status Nutritional status Physical activity Advanced directives List of other physicians Hospitalizations, surgeries, and ER visits in previous 12 months Vitals Screenings to include cognitive, depression, and falls Referrals and appointments  In  addition, I have reviewed and discussed with patient certain preventive protocols, quality metrics, and best practice recommendations. A written personalized care plan for preventive services as well as general preventive health recommendations were provided to patient.     Kellie Simmering, LPN   [id number]   Nurse Notes: none  Due to this being a virtual visit, the after visit summary with patients personalized plan was offered to patient via mail or my-chart.  Patient would like to access on my-chart

## 2022-01-28 NOTE — Patient Instructions (Addendum)
Ms. Virginia Russell , Thank you for taking time to come for your Medicare Wellness Visit. I appreciate your ongoing commitment to your health goals. Please review the following plan we discussed and let me know if I can assist you in the future.   Screening recommendations/referrals: Colonoscopy: not required Mammogram: completed 12/16/2021 Bone Density: completed 04/04/2019 Recommended yearly ophthalmology/optometry visit for glaucoma screening and checkup Recommended yearly dental visit for hygiene and checkup  Vaccinations: Influenza vaccine: completed 12/12/2021 Pneumococcal vaccine: completed 12/25/2018 Tdap vaccine: completed 01/09/2013, due 01/10/2023 Shingles vaccine: completed   Covid-19: 03/04/2021, 02/01/2020, 05/25/2019, 05/03/2019  Advanced directives: Please bring a copy of your POA (Power of Attorney) and/or Living Will to your next appointment.   Conditions/risks identified: none  Next appointment: Follow up in one year for your annual wellness visit    Preventive Care 65 Years and Older, Female Preventive care refers to lifestyle choices and visits with your health care provider that can promote health and wellness. What does preventive care include? A yearly physical exam. This is also called an annual well check. Dental exams once or twice a year. Routine eye exams. Ask your health care provider how often you should have your eyes checked. Personal lifestyle choices, including: Daily care of your teeth and gums. Regular physical activity. Eating a healthy diet. Avoiding tobacco and drug use. Limiting alcohol use. Practicing safe sex. Taking low-dose aspirin every day. Taking vitamin and mineral supplements as recommended by your health care provider. What happens during an annual well check? The services and screenings done by your health care provider during your annual well check will depend on your age, overall health, lifestyle risk factors, and family history of  disease. Counseling  Your health care provider may ask you questions about your: Alcohol use. Tobacco use. Drug use. Emotional well-being. Home and relationship well-being. Sexual activity. Eating habits. History of falls. Memory and ability to understand (cognition). Work and work Statistician. Reproductive health. Screening  You may have the following tests or measurements: Height, weight, and BMI. Blood pressure. Lipid and cholesterol levels. These may be checked every 5 years, or more frequently if you are over 74 years old. Skin check. Lung cancer screening. You may have this screening every year starting at age 38 if you have a 30-pack-year history of smoking and currently smoke or have quit within the past 15 years. Fecal occult blood test (FOBT) of the stool. You may have this test every year starting at age 44. Flexible sigmoidoscopy or colonoscopy. You may have a sigmoidoscopy every 5 years or a colonoscopy every 10 years starting at age 74. Hepatitis C blood test. Hepatitis B blood test. Sexually transmitted disease (STD) testing. Diabetes screening. This is done by checking your blood sugar (glucose) after you have not eaten for a while (fasting). You may have this done every 1-3 years. Bone density scan. This is done to screen for osteoporosis. You may have this done starting at age 14. Mammogram. This may be done every 1-2 years. Talk to your health care provider about how often you should have regular mammograms. Talk with your health care provider about your test results, treatment options, and if necessary, the need for more tests. Vaccines  Your health care provider may recommend certain vaccines, such as: Influenza vaccine. This is recommended every year. Tetanus, diphtheria, and acellular pertussis (Tdap, Td) vaccine. You may need a Td booster every 10 years. Zoster vaccine. You may need this after age 87. Pneumococcal 13-valent conjugate (PCV13) vaccine. One  dose is recommended after age 35. Pneumococcal polysaccharide (PPSV23) vaccine. One dose is recommended after age 4. Talk to your health care provider about which screenings and vaccines you need and how often you need them. This information is not intended to replace advice given to you by your health care provider. Make sure you discuss any questions you have with your health care provider. Document Released: 04/18/2015 Document Revised: 12/10/2015 Document Reviewed: 01/21/2015 Elsevier Interactive Patient Education  2017 [place] Prevention in the Home Falls can cause injuries. They can happen to people of all ages. There are many things you can do to make your home safe and to help prevent falls. What can I do on the outside of my home? Regularly fix the edges of walkways and driveways and fix any cracks. Remove anything that might make you trip as you walk through a door, such as a raised step or threshold. Trim any bushes or trees on the path to your home. Use bright outdoor lighting. Clear any walking paths of anything that might make someone trip, such as rocks or tools. Regularly check to see if handrails are loose or broken. Make sure that both sides of any steps have handrails. Any raised decks and porches should have guardrails on the edges. Have any leaves, snow, or ice cleared regularly. Use sand or salt on walking paths during winter. Clean up any spills in your garage right away. This includes oil or grease spills. What can I do in the bathroom? Use night lights. Install grab bars by the toilet and in the tub and shower. Do not use towel bars as grab bars. Use non-skid mats or decals in the tub or shower. If you need to sit down in the shower, use a plastic, non-slip stool. Keep the floor dry. Clean up any water that spills on the floor as soon as it happens. Remove soap buildup in the tub or shower regularly. Attach bath mats securely with double-sided  non-slip rug tape. Do not have throw rugs and other things on the floor that can make you trip. What can I do in the bedroom? Use night lights. Make sure that you have a light by your bed that is easy to reach. Do not use any sheets or blankets that are too big for your bed. They should not hang down onto the floor. Have a firm chair that has side arms. You can use this for support while you get dressed. Do not have throw rugs and other things on the floor that can make you trip. What can I do in the kitchen? Clean up any spills right away. Avoid walking on wet floors. Keep items that you use a lot in easy-to-reach places. If you need to reach something above you, use a strong step stool that has a grab bar. Keep electrical cords out of the way. Do not use floor polish or wax that makes floors slippery. If you must use wax, use non-skid floor wax. Do not have throw rugs and other things on the floor that can make you trip. What can I do with my stairs? Do not leave any items on the stairs. Make sure that there are handrails on both sides of the stairs and use them. Fix handrails that are broken or loose. Make sure that handrails are as long as the stairways. Check any carpeting to make sure that it is firmly attached to the stairs. Fix any carpet that is loose or worn. Avoid  having throw rugs at the top or bottom of the stairs. If you do have throw rugs, attach them to the floor with carpet tape. Make sure that you have a light switch at the top of the stairs and the bottom of the stairs. If you do not have them, ask someone to add them for you. What else can I do to help prevent falls? Wear shoes that: Do not have high heels. Have rubber bottoms. Are comfortable and fit you well. Are closed at the toe. Do not wear sandals. If you use a stepladder: Make sure that it is fully opened. Do not climb a closed stepladder. Make sure that both sides of the stepladder are locked into place. Ask  someone to hold it for you, if possible. Clearly mark and make sure that you can see: Any grab bars or handrails. First and last steps. Where the edge of each step is. Use tools that help you move around (mobility aids) if they are needed. These include: Canes. Walkers. Scooters. Crutches. Turn on the lights when you go into a dark area. Replace any light bulbs as soon as they burn out. Set up your furniture so you have a clear path. Avoid moving your furniture around. If any of your floors are uneven, fix them. If there are any pets around you, be aware of where they are. Review your medicines with your doctor. Some medicines can make you feel dizzy. This can increase your chance of falling. Ask your doctor what other things that you can do to help prevent falls. This information is not intended to replace advice given to you by your health care provider. Make sure you discuss any questions you have with your health care provider. Document Released: 01/16/2009 Document Revised: 08/28/2015 Document Reviewed: 04/26/2014 Elsevier Interactive Patient Education  2017 [employer].

## 2022-02-11 ENCOUNTER — Other Ambulatory Visit: Payer: Self-pay | Admitting: Internal Medicine

## 2022-02-23 ENCOUNTER — Ambulatory Visit (INDEPENDENT_AMBULATORY_CARE_PROVIDER_SITE_OTHER): Payer: Medicare HMO | Admitting: Internal Medicine

## 2022-02-23 ENCOUNTER — Encounter: Payer: Self-pay | Admitting: Internal Medicine

## 2022-02-23 VITALS — BP 132/80 | HR 75 | Temp 98.0°F | Ht 61.0 in | Wt 143.0 lb

## 2022-02-23 DIAGNOSIS — F3341 Major depressive disorder, recurrent, in partial remission: Secondary | ICD-10-CM

## 2022-02-23 DIAGNOSIS — I10 Essential (primary) hypertension: Secondary | ICD-10-CM

## 2022-02-23 DIAGNOSIS — E1165 Type 2 diabetes mellitus with hyperglycemia: Secondary | ICD-10-CM

## 2022-02-23 DIAGNOSIS — Z6827 Body mass index (BMI) 27.0-27.9, adult: Secondary | ICD-10-CM | POA: Diagnosis not present

## 2022-02-23 LAB — BMP8+EGFR
BUN/Creatinine Ratio: 20 (ref 12–28)
BUN: 11 mg/dL (ref 8–27)
CO2: 24 mmol/L (ref 20–29)
Calcium: 9.3 mg/dL (ref 8.7–10.3)
Chloride: 100 mmol/L (ref 96–106)
Creatinine, Ser: 0.54 mg/dL — ABNORMAL LOW (ref 0.57–1.00)
Glucose: 115 mg/dL — ABNORMAL HIGH (ref 70–99)
Potassium: 3.9 mmol/L (ref 3.5–5.2)
Sodium: 140 mmol/L (ref 134–144)
eGFR: 95 mL/min/{1.73_m2} (ref >59)

## 2022-02-23 LAB — POCT URINALYSIS DIPSTICK
Bilirubin, UA: NEGATIVE
Blood, UA: NEGATIVE
Glucose, UA: NEGATIVE
Nitrite, UA: NEGATIVE
Protein, UA: NEGATIVE
Spec Grav, UA: 1.025 (ref 1.010–1.025)
Urobilinogen, UA: 0.2 E.U./dL
pH, UA: 6 (ref 5.0–8.0)

## 2022-02-23 LAB — HEMOGLOBIN A1C
Est. average glucose Bld gHb Est-mCnc: 157 mg/dL
Hgb A1c MFr Bld: 7.1 % — ABNORMAL HIGH (ref 4.8–5.6)

## 2022-02-23 NOTE — Patient Instructions (Addendum)
Hypertension, Adult Hypertension is another name for high blood pressure. High blood pressure forces your heart to work harder to pump blood. This can cause problems over time. There are two numbers in a blood pressure reading. There is a top number (systolic) over a bottom number (diastolic). It is best to have a blood pressure that is below 120/80. What are the causes? The cause of this condition is not known. Some other conditions can lead to high blood pressure. What increases the risk? Some lifestyle factors can make you more likely to develop high blood pressure: Smoking. Not getting enough exercise or physical activity. Being overweight. Having too much fat, sugar, calories, or salt (sodium) in your diet. Drinking too much alcohol. Other risk factors include: Having any of these conditions: Heart disease. Diabetes. High cholesterol. Kidney disease. Obstructive sleep apnea. Having a family history of high blood pressure and high cholesterol. Age. The risk increases with age. Stress. What are the signs or symptoms? High blood pressure may not cause symptoms. Very high blood pressure (hypertensive crisis) may cause: Headache. Fast or uneven heartbeats (palpitations). Shortness of breath. Nosebleed. Vomiting or feeling like you may vomit (nauseous). Changes in how you see. Very bad chest pain. Feeling dizzy. Seizures. How is this treated? This condition is treated by making healthy lifestyle changes, such as: Eating healthy foods. Exercising more. Drinking less alcohol. Your doctor may prescribe medicine if lifestyle changes do not help enough and if: Your top number is above 130. Your bottom number is above 80. Your personal target blood pressure may vary. Follow these instructions at home: Eating and drinking  If told, follow the DASH eating plan. To follow this plan: Fill one half of your plate at each meal with fruits and vegetables. Fill one fourth of your plate  at each meal with whole grains. Whole grains include whole-wheat pasta, brown rice, and whole-grain bread. Eat or drink low-fat dairy products, such as skim milk or low-fat yogurt. Fill one fourth of your plate at each meal with low-fat (lean) proteins. Low-fat proteins include fish, chicken without skin, eggs, beans, and tofu. Avoid fatty meat, cured and processed meat, or chicken with skin. Avoid pre-made or processed food. Limit the amount of salt in your diet to less than 1,500 mg each day. Do not drink alcohol if: Your doctor tells you not to drink. You are pregnant, may be pregnant, or are planning to become pregnant. If you drink alcohol: Limit how much you have to: 0-1 drink a day for women. 0-2 drinks a day for men. Know how much alcohol is in your drink. In the [place] one drink equals one 12 oz bottle of beer (355 mL), one 5 oz glass of wine (148 mL), or one 1 oz glass of hard liquor (44 mL). Lifestyle  Work with your doctor to stay at a healthy weight or to lose weight. Ask your doctor what the best weight is for you. Get at least 30 minutes of exercise that causes your heart to beat faster (aerobic exercise) most days of the week. This may include walking, swimming, or biking. Get at least 30 minutes of exercise that strengthens your muscles (resistance exercise) at least 3 days a week. This may include lifting weights or doing Pilates. Do not smoke or use any products that contain nicotine or tobacco. If you need help quitting, ask your doctor. Check your blood pressure at home as told by your doctor. Keep all follow-up visits. Medicines Take over-the-counter and prescription medicines  only as told by your doctor. Follow directions carefully. Do not skip doses of blood pressure medicine. The medicine does not work as well if you skip doses. Skipping doses also puts you at risk for problems. Ask your doctor about side effects or reactions to medicines that you should watch  for. Contact a doctor if: You think you are having a reaction to the medicine you are taking. You have headaches that keep coming back. You feel dizzy. You have swelling in your ankles. You have trouble with your vision. Get help right away if: You get a very bad headache. You start to feel mixed up (confused). You feel weak or numb. You feel faint. You have very bad pain in your: Chest. Belly (abdomen). You vomit more than once. You have trouble breathing. These symptoms may be an emergency. Get help right away. Call 911. Do not wait to see if the symptoms will go away. Do not drive yourself to the hospital. Summary Hypertension is another name for high blood pressure. High blood pressure forces your heart to work harder to pump blood. For most people, a normal blood pressure is less than 120/80. Making healthy choices can help lower blood pressure. If your blood pressure does not get lower with healthy choices, you may need to take medicine. This information is not intended to replace advice given to you by your health care provider. Make sure you discuss any questions you have with your health care provider. Document Revised: 01/08/2021 Document Reviewed: 01/08/2021 Elsevier Patient Education  [place]  Type 2 Diabetes Mellitus, Diagnosis, Adult Type 2 diabetes (type 2 diabetes mellitus) is a long-term (chronic) disease. It may happen when there is one or both of these problems: The pancreas does not make enough insulin. The body does not react in a normal way to insulin that it makes. Insulin lets sugars go into cells in your body. If you have type 2 diabetes, sugars cannot get into your cells. Sugars build up in the blood. This causes high blood sugar. What are the causes? The exact cause of this condition is not known. What increases the risk? Having type 2 diabetes in your family. Being overweight or very overweight. Not being active. Your body not reacting in  a normal way to the insulin it makes. Having higher than normal blood sugar over time. Having a type of diabetes when you were pregnant. Having a condition that causes small fluid-filled sacs on your ovaries. What are the signs or symptoms? At first, you may have no symptoms. You will get symptoms slowly. They may include: More thirst than normal. More hunger than normal. Needing to pee more than normal. Losing weight without trying. Feeling tired. Feeling weak. Seeing things blurry. Dark patches on your skin. How is this treated? This condition may be treated by a diabetes expert. You may need to: Follow an eating plan made by a food expert (dietitian). Get regular exercise. Find ways to deal with stress. Check blood sugar as often as told. Take medicines. Your doctor will set treatment goals for you. Your blood sugar should be at these levels: Before meals: 80-130 mg/dL (4.4-7.2 mmol/L). After meals: below 180 mg/dL (10 mmol/L). Over the last 2-3 months: less than 7%. Follow these instructions at home: Medicines Take your diabetes medicines or insulin every day. Take medicines as told to help you prevent other problems caused by this condition. You may need: Aspirin. Medicine to lower cholesterol. Medicine to control blood pressure. Questions to  ask your doctor Should I meet with a diabetes educator? What medicines do I need, and when should I take them? What will I need to treat my condition at home? When should I check my blood sugar? Where can I find a support group? Who can I call if I have questions? When is my next doctor visit? General instructions Take over-the-counter and prescription medicines only as told by your doctor. Keep all follow-up visits. Where to find more information For help and guidance and more information about diabetes, please go to: American Diabetes Association (ADA): www.diabetes.org American Association of Diabetes Care and Education  Specialists (ADCES): www.diabeteseducator.org International Diabetes Federation (IDF): MemberVerification.ca Contact a doctor if: Your blood sugar is at or above 240 mg/dL (13.3 mmol/L) for 2 days in a row. You have been sick for 2 days or more, and you are not getting better. You have had a fever for 2 days or more, and you are not getting better. You have any of these problems for more than 6 hours: You cannot eat or drink. You feel like you may vomit. You vomit. You have watery poop (diarrhea). Get help right away if: Your blood sugar is lower than 54 mg/dL (3 mmol/L). You feel mixed up (confused). You have trouble thinking clearly. You have trouble breathing. You have medium or large ketone levels in your pee. These symptoms may be an emergency. Get help right away. Call your local emergency services (911 in the [place] Do not wait to see if the symptoms will go away. Do not drive yourself to the hospital. Summary Type 2 diabetes is a long-term disease. Your pancreas may not make enough insulin, or your body may not react in a normal way to insulin that it makes. This condition is treated with an eating plan, lifestyle changes, and medicines. Your doctor will set treatment goals for you. These will help you keep your blood sugar in a healthy range. Keep all follow-up visits. This information is not intended to replace advice given to you by your health care provider. Make sure you discuss any questions you have with your health care provider. Document Revised: 06/16/2020 Document Reviewed: 06/16/2020 Elsevier Patient Education  [place]. .

## 2022-02-23 NOTE — Progress Notes (Addendum)
I,Victoria T Hamilton,acting as a scribe for Maximino Greenland, MD.,have documented all relevant documentation on the behalf of Maximino Greenland, MD,as directed by  Maximino Greenland, MD while in the presence of Maximino Greenland, MD.    Subjective:     Patient ID: Virginia Russell , female    DOB: [birth year] , 76 y.o.   MRN: [id number]   Chief Complaint  Patient presents with   Hypertension    HPI  The patient is here today for a follow-up on her diabetes and blood pressure.  She reports compliance with meds. She adds her bp in the morning reads 140/78 upon awakening, it runs 112/58- 112/78 in the afternoons. She takes amlodipine at bedtime and losartan in the morning.    She denies headache, chest pain, SOB, blurred vision, and dizziness.   Hypertension This is a chronic problem. The current episode started more than 1 year ago. The problem has been gradually improving since onset. The problem is controlled. Pertinent negatives include no blurred vision or palpitations. Past treatments include ACE inhibitors and diuretics. The current treatment provides moderate improvement.  Diabetes She presents for her follow-up diabetic visit. She has type 2 diabetes mellitus. No MedicAlert identification noted. The initial diagnosis of diabetes was made 1 year ago. Her disease course has been stable. There are no hypoglycemic associated symptoms. Pertinent negatives for diabetes include no blurred vision, no polydipsia, no polyphagia and no polyuria. There are no hypoglycemic complications. Symptoms are stable. Risk factors for coronary artery disease include dyslipidemia, diabetes mellitus, hypertension, post-menopausal and stress. She is compliant with treatment all of the time. She is following a diabetic diet. She participates in exercise intermittently. Her breakfast blood glucose is taken between 7-8 am. Her breakfast blood glucose range is generally 90-110 mg/dl. An ACE inhibitor/angiotensin II  receptor blocker is being taken. Eye exam is current.     Past Medical History:  Diagnosis Date   Anxiety    Aortic stenosis, mild 02/11/2020   Mean gradient 9 mmHg on 11/2019   Carotid stenosis, bilateral 02/11/2020   Diabetes mellitus without complication (HCC)    High cholesterol    HTN (hypertension)    Hyperlipidemia LDL goal <70 02/11/2020     Family History  Problem Relation Age of Onset   Breast cancer Mother    Heart disease Mother    Heart attack Mother    Stroke Father    Heart attack Father    Heart attack Daughter    Heart attack Brother    Heart attack Maternal Grandmother    Heart attack Paternal Grandmother      Current Outpatient Medications:    Accu-Chek Softclix Lancets lancets, Use as instructed to check blood sugars 1 time per day dx: e11.9, Disp: 150 each, Rfl: 3   Alcohol Swabs (B-D SINGLE USE SWABS BUTTERFLY) PADS, 100 Pieces by Does not apply route as needed., Disp: 100 each, Rfl: 3   amLODipine (NORVASC) 5 MG tablet, Take one tab po qpm, Disp: 30 tablet, Rfl: 11   aspirin 81 MG tablet, Take 81 mg by mouth daily., Disp: , Rfl:    atorvastatin (LIPITOR) 40 MG tablet, TAKE 1 TABLET EVERY NIGHT, Disp: 90 tablet, Rfl: 1   Blood Glucose Monitoring Suppl (ACCU-CHEK AVIVA PLUS) w/Device KIT, Use as directed to check blood sugars 1 time per day dx: e11.22, Disp: 1 kit, Rfl: 1   Blood Glucose Monitoring Suppl (ACCU-CHEK GUIDE) w/Device KIT, Use to check blood sugars once  daily E11.9, Disp: 1 kit, Rfl: 2   celecoxib (CELEBREX) 200 MG capsule, Take 1 capsule (200 mg total) by mouth 2 (two) times daily., Disp: 90 capsule, Rfl: 2   glucose blood (ACCU-CHEK AVIVA PLUS) test strip, Use as instructed to check blood sugars 1 time per day dx: e11.22, Disp: 150 each, Rfl: 3   glucose blood (ACCU-CHEK GUIDE) test strip, Use as instructed to check blood sugars once daily E11.9, Disp: 100 each, Rfl: 3   losartan (COZAAR) 50 MG tablet, TAKE 1 TABLET EVERY DAY, Disp: 90 tablet,  Rfl: 1   metFORMIN (GLUCOPHAGE-XR) 750 MG 24 hr tablet, TAKE 2 TABLETS EVERY EVENING, Disp: 180 tablet, Rfl: 1   sitaGLIPtin (JANUVIA) 100 MG tablet, Take 100 mg by mouth daily., Disp: , Rfl:    venlafaxine (EFFEXOR) 37.5 MG tablet, TAKE 1 TABLET EVERY DAY, Disp: 90 tablet, Rfl: 2   benzonatate (TESSALON) 100 MG capsule, Take 1 capsule (100 mg total) by mouth 3 (three) times daily. (Patient not taking: Reported on 11/11/2021), Disp: 30 capsule, Rfl: 1   Allergies  Allergen Reactions   Codeine      Review of Systems  Constitutional: Negative.   Eyes:  Negative for blurred vision.  Respiratory: Negative.    Cardiovascular: Negative.  Negative for palpitations.  Endocrine: Negative for polydipsia, polyphagia and polyuria.  Neurological: Negative.   Psychiatric/Behavioral: Negative.       Today's Vitals   02/23/22 0812  BP: 132/80  Pulse: 75  Temp: 98 F (36.7 C)  SpO2: 95%  Weight: 143 lb (64.9 kg)  Height: _0  (1.549 m)   Body mass index is 27.02 kg/m.  Wt Readings from Last 3 Encounters:  02/23/22 143 lb (64.9 kg)  01/28/22 145 lb (65.8 kg)  11/11/21 144 lb 9.6 oz (65.6 kg)    Objective:  Physical Exam Vitals and nursing note reviewed.  Constitutional:      Appearance: Normal appearance.  HENT:     Head: Normocephalic and atraumatic.     Nose:     Comments: Masked     Mouth/Throat:     Comments: Masked  Eyes:     Extraocular Movements: Extraocular movements intact.  Cardiovascular:     Rate and Rhythm: Normal rate and regular rhythm.     Heart sounds: Murmur heard.  Pulmonary:     Effort: Pulmonary effort is normal.     Breath sounds: Normal breath sounds.  Musculoskeletal:     Cervical back: Normal range of motion.  Skin:    General: Skin is warm.  Neurological:     General: No focal deficit present.     Mental Status: She is alert.  Psychiatric:        Mood and Affect: Mood normal.        Behavior: Behavior normal.         Assessment And Plan:      1. Essential hypertension, benign Comments: Chronic, fair control. Advised to take amlodipine at dinner instead of bedtime and to decrease sodium intake.  She will c/w losartan in am.  2. Uncontrolled type 2 diabetes mellitus with hyperglycemia ([place]) Comments: Chronic, I will check an a1c today. I will adjust meds as needed. Last a1c 7.9, pt advised goal is less than 7.0. She will f/u in Feb 2024 for her next CPE. - Hemoglobin A1c - POCT Urinalysis Dipstick (81002) - Microalbumin / creatinine urine ratio - BMP8+eGFR  3. Recurrent major depressive disorder, in partial remission ([place]) Comments: Chronic,  she will continue with venlaxafine.  4. BMI 27.0-27.9,adult Comments: She is encouraged to aim for at least 150 minutes of exercise per week.   Patient was given opportunity to ask questions. Patient verbalized understanding of the plan and was able to repeat key elements of the plan. All questions were answered to their satisfaction.   I, Maximino Greenland, MD, have reviewed all documentation for this visit. The documentation on 02/23/22 for the exam, diagnosis, procedures, and orders are all accurate and complete.   IF YOU HAVE BEEN REFERRED TO A SPECIALIST, IT MAY TAKE 1-2 WEEKS TO SCHEDULE/PROCESS THE REFERRAL. IF YOU HAVE NOT HEARD FROM US/SPECIALIST IN TWO WEEKS, PLEASE GIVE [place] A CALL AT [phone] X [phone].   THE PATIENT IS ENCOURAGED TO PRACTICE SOCIAL DISTANCING DUE TO THE COVID-19 PANDEMIC.

## 2022-02-24 LAB — MICROALBUMIN / CREATININE URINE RATIO
Creatinine, Urine: 50.6 mg/dL
Microalb/Creat Ratio: 14 mg/g creat (ref 0–29)
Microalbumin, Urine: 7 ug/mL

## 2022-03-16 DIAGNOSIS — H10413 Chronic giant papillary conjunctivitis, bilateral: Secondary | ICD-10-CM | POA: Diagnosis not present

## 2022-03-16 DIAGNOSIS — H40013 Open angle with borderline findings, low risk, bilateral: Secondary | ICD-10-CM | POA: Diagnosis not present

## 2022-03-16 DIAGNOSIS — H25813 Combined forms of age-related cataract, bilateral: Secondary | ICD-10-CM | POA: Diagnosis not present

## 2022-03-16 DIAGNOSIS — H0102A Squamous blepharitis right eye, upper and lower eyelids: Secondary | ICD-10-CM | POA: Diagnosis not present

## 2022-03-16 DIAGNOSIS — E113293 Type 2 diabetes mellitus with mild nonproliferative diabetic retinopathy without macular edema, bilateral: Secondary | ICD-10-CM | POA: Diagnosis not present

## 2022-03-16 DIAGNOSIS — H0102B Squamous blepharitis left eye, upper and lower eyelids: Secondary | ICD-10-CM | POA: Diagnosis not present

## 2022-04-15 DIAGNOSIS — H25811 Combined forms of age-related cataract, right eye: Secondary | ICD-10-CM | POA: Diagnosis not present

## 2022-04-15 DIAGNOSIS — H268 Other specified cataract: Secondary | ICD-10-CM | POA: Diagnosis not present

## 2022-04-15 HISTORY — PX: CATARACT EXTRACTION, BILATERAL: SHX1313

## 2022-04-25 DIAGNOSIS — H2512 Age-related nuclear cataract, left eye: Secondary | ICD-10-CM | POA: Diagnosis not present

## 2022-04-27 ENCOUNTER — Encounter: Payer: Medicare HMO | Admitting: Internal Medicine

## 2022-04-29 DIAGNOSIS — H2512 Age-related nuclear cataract, left eye: Secondary | ICD-10-CM | POA: Diagnosis not present

## 2022-05-11 ENCOUNTER — Encounter: Payer: Self-pay | Admitting: Internal Medicine

## 2022-05-11 ENCOUNTER — Ambulatory Visit (INDEPENDENT_AMBULATORY_CARE_PROVIDER_SITE_OTHER): Payer: Medicare HMO | Admitting: Internal Medicine

## 2022-05-11 VITALS — BP 118/76 | HR 87 | Temp 98.1°F | Ht 61.0 in | Wt 140.6 lb

## 2022-05-11 DIAGNOSIS — Z23 Encounter for immunization: Secondary | ICD-10-CM

## 2022-05-11 DIAGNOSIS — F3341 Major depressive disorder, recurrent, in partial remission: Secondary | ICD-10-CM | POA: Diagnosis not present

## 2022-05-11 DIAGNOSIS — E785 Hyperlipidemia, unspecified: Secondary | ICD-10-CM

## 2022-05-11 DIAGNOSIS — Z0001 Encounter for general adult medical examination with abnormal findings: Secondary | ICD-10-CM | POA: Diagnosis not present

## 2022-05-11 DIAGNOSIS — Z0000 Encounter for general adult medical examination without abnormal findings: Secondary | ICD-10-CM

## 2022-05-11 DIAGNOSIS — E1169 Type 2 diabetes mellitus with other specified complication: Secondary | ICD-10-CM

## 2022-05-11 DIAGNOSIS — E1165 Type 2 diabetes mellitus with hyperglycemia: Secondary | ICD-10-CM | POA: Diagnosis not present

## 2022-05-11 DIAGNOSIS — H6121 Impacted cerumen, right ear: Secondary | ICD-10-CM | POA: Diagnosis not present

## 2022-05-11 DIAGNOSIS — I10 Essential (primary) hypertension: Secondary | ICD-10-CM

## 2022-05-11 LAB — POCT URINALYSIS DIPSTICK
Bilirubin, UA: NEGATIVE
Blood, UA: NEGATIVE
Glucose, UA: NEGATIVE
Ketones, UA: NEGATIVE
Leukocytes, UA: NEGATIVE
Nitrite, UA: NEGATIVE
Protein, UA: POSITIVE — AB
Spec Grav, UA: >=1.03 — AB (ref 1.010–1.025)
Urobilinogen, UA: 0.2 E.U./dL
pH, UA: 5.5 (ref 5.0–8.0)

## 2022-05-11 NOTE — Progress Notes (Signed)
I,Virginia Russell,acting as a scribe for Maximino Greenland, MD.,have documented all relevant documentation on the behalf of Maximino Greenland, MD,as directed by  Maximino Greenland, MD while in the presence of Maximino Greenland, MD.   Subjective:     Patient ID: Virginia Russell [facility] , female    DOB: 1945/08/14 , 77 y.o.   MRN: [id number]   Chief Complaint  Patient presents with   Annual Exam   Diabetes   Hypertension    HPI  She is here today for a full physical examination. She is no longer followed by GYN. She has no specific concerns or complaints at this time. She reports compliance with meds. She denies having any headaches, chest pain and shortness of breath.   She states she had cataract surgery in December.   Diabetes She presents for her follow-up diabetic visit. She has type 2 diabetes mellitus. Her disease course has been stable. There are no hypoglycemic associated symptoms. Pertinent negatives for diabetes include no blurred vision and no chest pain. There are no hypoglycemic complications. Risk factors for coronary artery disease include dyslipidemia, diabetes mellitus, hypertension, post-menopausal and stress. She is compliant with treatment most of the time. She is following a diabetic diet. She participates in exercise intermittently. Her breakfast blood glucose is taken between 7-8 am. Her breakfast blood glucose range is generally 90-110 mg/dl. An ACE inhibitor/angiotensin II receptor blocker is being taken. Eye exam is current.  Hypertension This is a chronic problem. The current episode started more than 1 year ago. The problem has been gradually improving since onset. The problem is controlled. Pertinent negatives include no blurred vision, chest pain, palpitations or shortness of breath. Past treatments include ACE inhibitors and diuretics. The current treatment provides moderate improvement.     Past Medical History:  Diagnosis Date   Anxiety    Aortic stenosis, mild  02/11/2020   Mean gradient 9 mmHg on 11/2019   Carotid stenosis, bilateral 02/11/2020   Diabetes mellitus without complication (HCC)    High cholesterol    HTN (hypertension)    Hyperlipidemia LDL goal <70 02/11/2020     Family History  Problem Relation Age of Onset   Breast cancer Mother    Heart disease Mother    Heart attack Mother    Stroke Father    Heart attack Father    Heart attack Daughter    Heart attack Brother    Heart attack Maternal Grandmother    Heart attack Paternal Grandmother      Current Outpatient Medications:    Accu-Chek Softclix Lancets lancets, Use as instructed to check blood sugars 1 time per day dx: e11.9, Disp: 150 each, Rfl: 3   Alcohol Swabs (B-D SINGLE USE SWABS BUTTERFLY) PADS, 100 Pieces by Does not apply route as needed., Disp: 100 each, Rfl: 3   amLODipine (NORVASC) 5 MG tablet, Take one tab po qpm, Disp: 30 tablet, Rfl: 11   aspirin 81 MG tablet, Take 81 mg by mouth daily., Disp: , Rfl:    atorvastatin (LIPITOR) 40 MG tablet, TAKE 1 TABLET EVERY NIGHT, Disp: 90 tablet, Rfl: 1   Blood Glucose Monitoring Suppl (ACCU-CHEK AVIVA PLUS) w/Device KIT, Use as directed to check blood sugars 1 time per day dx: e11.22, Disp: 1 kit, Rfl: 1   Blood Glucose Monitoring Suppl (ACCU-CHEK GUIDE) w/Device KIT, Use to check blood sugars once daily E11.9, Disp: 1 kit, Rfl: 2   celecoxib (CELEBREX) 200 MG capsule, Take 1 capsule (200  mg total) by mouth 2 (two) times daily., Disp: 90 capsule, Rfl: 2   glucose blood (ACCU-CHEK AVIVA PLUS) test strip, Use as instructed to check blood sugars 1 time per day dx: e11.22, Disp: 150 each, Rfl: 3   glucose blood (ACCU-CHEK GUIDE) test strip, Use as instructed to check blood sugars once daily E11.9, Disp: 100 each, Rfl: 3   losartan (COZAAR) 50 MG tablet, TAKE 1 TABLET EVERY DAY, Disp: 90 tablet, Rfl: 1   metFORMIN (GLUCOPHAGE-XR) 750 MG 24 hr tablet, TAKE 2 TABLETS EVERY EVENING, Disp: 180 tablet, Rfl: 1   sitaGLIPtin (JANUVIA)  100 MG tablet, Take 100 mg by mouth daily., Disp: , Rfl:    venlafaxine (EFFEXOR) 37.5 MG tablet, TAKE 1 TABLET EVERY DAY, Disp: 90 tablet, Rfl: 2   benzonatate (TESSALON) 100 MG capsule, Take 1 capsule (100 mg total) by mouth 3 (three) times daily. (Patient not taking: Reported on 11/11/2021), Disp: 30 capsule, Rfl: 1   Allergies  Allergen Reactions   Codeine       The patient states she uses post menopausal status for birth control. Last LMP was No LMP recorded. Patient has had a hysterectomy.. Negative for Dysmenorrhea. Negative for: breast discharge, breast lump(s), breast pain and breast self exam. Associated symptoms include abnormal vaginal bleeding. Pertinent negatives include abnormal bleeding (hematology), anxiety, decreased libido, depression, difficulty falling sleep, dyspareunia, history of infertility, nocturia, sexual dysfunction, sleep disturbances, urinary incontinence, urinary urgency, vaginal discharge and vaginal itching. Diet regular.The patient states her exercise level is  intermittent, she has an active job cleaning homes.   . The patient's tobacco use is:  Social History   Tobacco Use  Smoking Status Never  Smokeless Tobacco Never  . She has been exposed to passive smoke. The patient's alcohol use is:  Social History   Substance and Sexual Activity  Alcohol Use No    Review of Systems  Constitutional: Negative.   HENT: Negative.    Eyes: Negative.  Negative for blurred vision.  Respiratory: Negative.  Negative for shortness of breath.   Cardiovascular: Negative.  Negative for chest pain and palpitations.  Gastrointestinal: Negative.   Endocrine: Negative.   Genitourinary: Negative.   Musculoskeletal: Negative.   Skin: Negative.   Allergic/Immunologic: Negative.   Neurological: Negative.   Hematological: Negative.   Psychiatric/Behavioral: Negative.       Today's Vitals   05/11/22 1446  BP: 118/76  Pulse: 87  Temp: 98.1 F (36.7 C)  SpO2: 98%   Weight: 140 lb 9.6 oz (63.8 kg)  Height: '5\' 1"'$  (1.549 m)   Body mass index is 26.57 kg/m.  Wt Readings from Last 3 Encounters:  05/11/22 140 lb 9.6 oz (63.8 kg)  02/23/22 143 lb (64.9 kg)  01/28/22 145 lb (65.8 kg)    Objective:  Physical Exam Vitals and nursing note reviewed.  Constitutional:      Appearance: Normal appearance.  HENT:     Head: Normocephalic and atraumatic.     Right Ear: Ear canal and external ear normal. There is impacted cerumen.     Left Ear: Tympanic membrane, ear canal and external ear normal.     Nose:     Comments: Masked     Mouth/Throat:     Comments: Masked  Eyes:     Extraocular Movements: Extraocular movements intact.     Conjunctiva/sclera: Conjunctivae normal.     Pupils: Pupils are equal, round, and reactive to light.  Cardiovascular:     Rate and Rhythm: Normal  rate and regular rhythm.     Pulses: Normal pulses.          Dorsalis pedis pulses are 2+ on the right side and 2+ on the left side.     Heart sounds: Normal heart sounds.  Pulmonary:     Effort: Pulmonary effort is normal.     Breath sounds: Normal breath sounds.  Chest:  Breasts:    Tanner Score is 5.     Right: Normal.     Left: Normal.  Abdominal:     General: Bowel sounds are normal.     Palpations: Abdomen is soft.  Genitourinary:    Comments: deferred Musculoskeletal:        General: Normal range of motion.     Cervical back: Normal range of motion and neck supple.  Feet:     Right foot:     Protective Sensation: 5 sites tested.  5 sites sensed.     Skin integrity: Dry skin present.     Toenail Condition: Right toenails are long.     Left foot:     Protective Sensation: 5 sites tested.  5 sites sensed.     Skin integrity: Dry skin present.     Toenail Condition: Left toenails are long.  Skin:    General: Skin is warm and dry.  Neurological:     General: No focal deficit present.     Mental Status: She is alert and oriented to person, place, and time.   Psychiatric:        Mood and Affect: Mood normal.        Behavior: Behavior normal.         Assessment And Plan:     1. Encounter for general adult medical examination w/o abnormal findings Comments: A full exam was performed. Importance of monthly self breast exams was discussed with the patient.  PATIENT IS ADVISED TO GET 30-45 MINUTES REGULAR EXERCISE NO LESS THAN FOUR TO FIVE DAYS PER WEEK - BOTH WEIGHTBEARING EXERCISES AND AEROBIC ARE RECOMMENDED.  PATIENT IS ADVISED TO FOLLOW A HEALTHY DIET WITH AT LEAST SIX FRUITS/VEGGIES PER DAY, DECREASE INTAKE OF RED MEAT, AND TO INCREASE FISH INTAKE TO TWO DAYS PER WEEK.  MEATS/FISH SHOULD NOT BE FRIED, BAKED OR BROILED IS PREFERABLE.  IT IS ALSO IMPORTANT TO CUT BACK ON YOUR SUGAR INTAKE. PLEASE AVOID ANYTHING WITH ADDED SUGAR, CORN SYRUP OR OTHER SWEETENERS. IF YOU MUST USE A SWEETENER, YOU CAN TRY STEVIA. IT IS ALSO IMPORTANT TO AVOID ARTIFICIALLY SWEETENERS AND DIET BEVERAGES. LASTLY, I SUGGEST WEARING SPF 50 SUNSCREEN ON EXPOSED PARTS AND ESPECIALLY WHEN IN THE DIRECT SUNLIGHT FOR AN EXTENDED PERIOD OF TIME.  PLEASE AVOID FAST FOOD RESTAURANTS AND INCREASE YOUR WATER INTAKE.  2. Dyslipidemia associated with type 2 diabetes mellitus ([place]) Comments: Chronic, diabetic foot exam was performed. She will f/u in 4 months for re-evaluation. She will c/w Januvia '100mg'$  daily.  She is UTD with eye exam.  I DISCUSSED WITH THE PATIENT AT LENGTH REGARDING THE GOALS OF GLYCEMIC CONTROL AND POSSIBLE LONG-TERM COMPLICATIONS.  I  ALSO STRESSED THE IMPORTANCE OF COMPLIANCE WITH HOME GLUCOSE MONITORING, DIETARY RESTRICTIONS INCLUDING AVOIDANCE OF SUGARY DRINKS/PROCESSED FOODS,  ALONG WITH REGULAR EXERCISE.  I  ALSO STRESSED THE IMPORTANCE OF ANNUAL EYE EXAMS, SELF FOOT CARE AND COMPLIANCE WITH OFFICE VISITS.  - POCT Urinalysis Dipstick (81002) - Microalbumin / creatinine urine ratio - EKG 12-Lead - CMP14+EGFR - CBC - Hemoglobin A1c - Lipid panel - Tdap vaccine  greater  than or equal to 7yo IM  3. Essential hypertension, benign Comments: Chronic, well controlled. EKG performed, NSR w/o acute changes. She will c/w losartan '50mg'$  and amlodipine '5mg'$  daily. She is encouraged to follow low sodium diet. She will f/u in 4-6 months for re-evaluation.  - POCT Urinalysis Dipstick (81002) - Microalbumin / creatinine urine ratio - EKG 12-Lead - CMP14+EGFR - Lipid panel  4. Right ear impacted cerumen Comments: After obtaining verbal consent, right ear was flushed by irrigation w/o any complications. No TM abnormalities noted. - Ear Lavage  5. Recurrent major depressive disorder, in partial remission ([place]) Comments: Chronic, she will c/w venlafaxine 37.'5mg'$  daily.  6. Need for Tdap vaccination Comments: She is unable to get TDap here, will send to her local pharmacy. - Tdap vaccine greater than or equal to 7yo IM  Patient was given opportunity to ask questions. Patient verbalized understanding of the plan and was able to repeat key elements of the plan. All questions were answered to their satisfaction.   I, Maximino Greenland, MD, have reviewed all documentation for this visit. The documentation on 05/11/22 for the exam, diagnosis, procedures, and orders are all accurate and complete.   THE PATIENT IS ENCOURAGED TO PRACTICE SOCIAL DISTANCING DUE TO THE COVID-19 PANDEMIC.

## 2022-05-11 NOTE — Patient Instructions (Signed)

## 2022-05-12 LAB — CMP14+EGFR
ALT: 11 IU/L (ref 0–32)
AST: 17 IU/L (ref 0–40)
Albumin/Globulin Ratio: 1.9 (ref 1.2–2.2)
Albumin: 4.6 g/dL (ref 3.8–4.8)
Alkaline Phosphatase: 68 IU/L (ref 44–121)
BUN/Creatinine Ratio: 29 — ABNORMAL HIGH (ref 12–28)
BUN: 19 mg/dL (ref 8–27)
Bilirubin Total: 1 mg/dL (ref 0.0–1.2)
CO2: 24 mmol/L (ref 20–29)
Calcium: 9.9 mg/dL (ref 8.7–10.3)
Chloride: 100 mmol/L (ref 96–106)
Creatinine, Ser: 0.66 mg/dL (ref 0.57–1.00)
Globulin, Total: 2.4 g/dL (ref 1.5–4.5)
Glucose: 122 mg/dL — ABNORMAL HIGH (ref 70–99)
Potassium: 4.2 mmol/L (ref 3.5–5.2)
Sodium: 142 mmol/L (ref 134–144)
Total Protein: 7 g/dL (ref 6.0–8.5)
eGFR: 90 mL/min/{1.73_m2} (ref >59)

## 2022-05-12 LAB — HEMOGLOBIN A1C
Est. average glucose Bld gHb Est-mCnc: 166 mg/dL
Hgb A1c MFr Bld: 7.4 % — ABNORMAL HIGH (ref 4.8–5.6)

## 2022-05-12 LAB — CBC
Hematocrit: 39.7 % (ref 34.0–46.6)
Hemoglobin: 12.9 g/dL (ref 11.1–15.9)
MCH: 30.4 pg (ref 26.6–33.0)
MCHC: 32.5 g/dL (ref 31.5–35.7)
MCV: 94 fL (ref 79–97)
Platelets: 172 10*3/uL (ref 150–450)
RBC: 4.24 x10E6/uL (ref 3.77–5.28)
RDW: 12.7 % (ref 11.7–15.4)
WBC: 8.4 10*3/uL (ref 3.4–10.8)

## 2022-05-12 LAB — LIPID PANEL
Chol/HDL Ratio: 2.4 ratio (ref 0.0–4.4)
Cholesterol, Total: 159 mg/dL (ref 100–199)
HDL: 66 mg/dL (ref >39)
LDL Chol Calc (NIH): 78 mg/dL (ref 0–99)
Triglycerides: 76 mg/dL (ref 0–149)
VLDL Cholesterol Cal: 15 mg/dL (ref 5–40)

## 2022-05-13 LAB — MICROALBUMIN / CREATININE URINE RATIO
Creatinine, Urine: 262.3 mg/dL
Microalb/Creat Ratio: 17 mg/g creat (ref 0–29)
Microalbumin, Urine: 45.5 ug/mL

## 2022-05-25 DIAGNOSIS — H6121 Impacted cerumen, right ear: Secondary | ICD-10-CM | POA: Insufficient documentation

## 2022-07-07 ENCOUNTER — Other Ambulatory Visit: Payer: Self-pay | Admitting: Internal Medicine

## 2022-07-14 DIAGNOSIS — M170 Bilateral primary osteoarthritis of knee: Secondary | ICD-10-CM | POA: Diagnosis not present

## 2022-08-30 IMAGING — MG DIGITAL DIAGNOSTIC BILAT W/ TOMO W/ CAD
8 series · 8 of 24 positions shown · non-contrast
Comparison: Previous exam(s).

CLINICAL DATA: Patient presents for tenderness within the inferior
left breast.

EXAM:
DIGITAL DIAGNOSTIC BILATERAL MAMMOGRAM WITH TOMOSYNTHESIS AND CAD;
ULTRASOUND LEFT BREAST LIMITED
TECHNIQUE: Bilateral digital diagnostic mammography and breast tomosynthesis
was performed. The images were evaluated with computer-aided
detection.; Targeted ultrasound examination of the left breast was
performed

[R CC synth-2D]
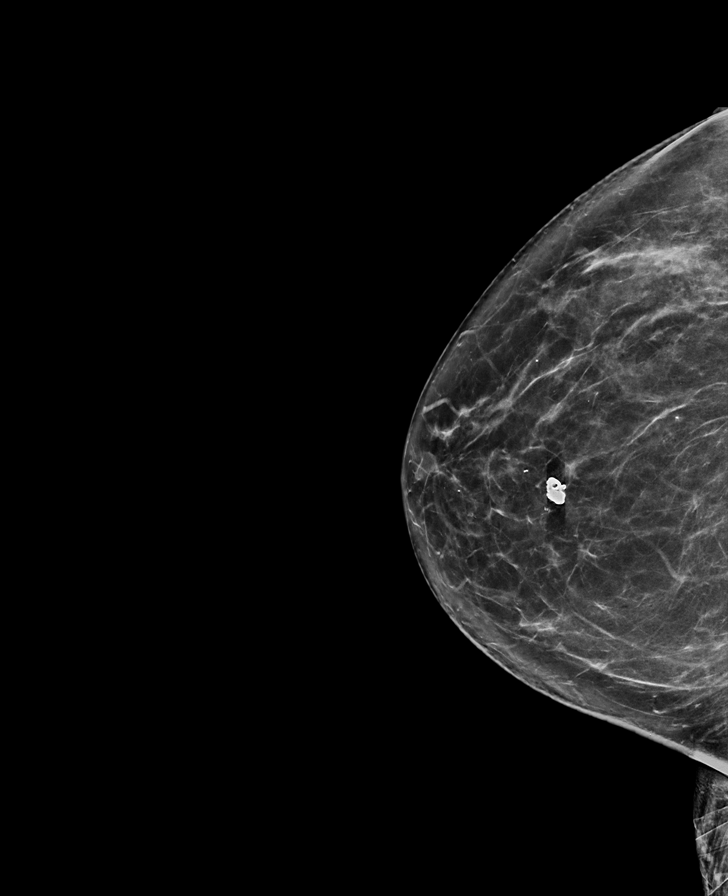

[L MLO synth-2D]
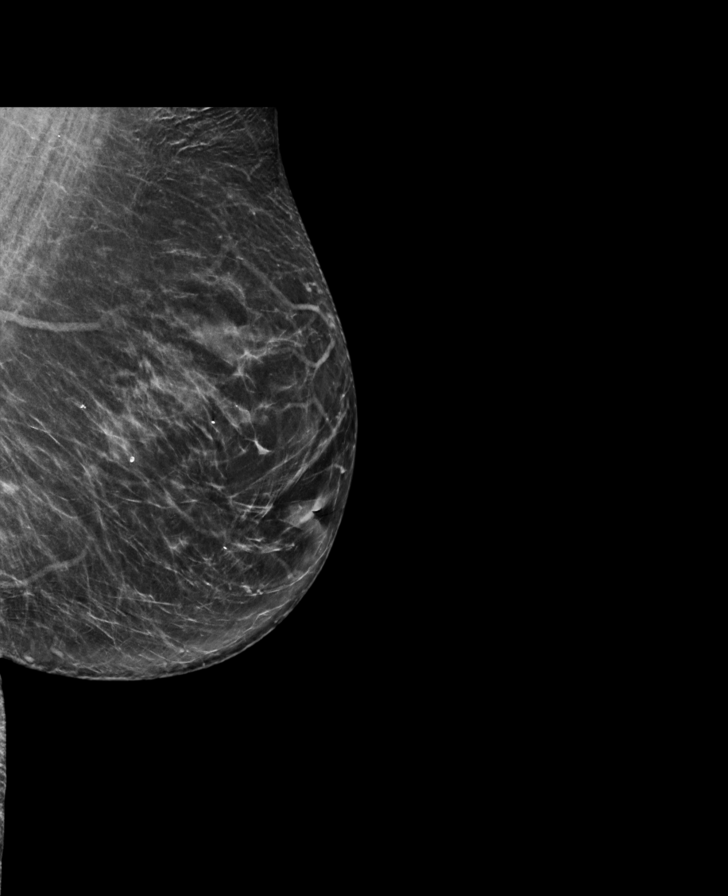

[L CC synth-2D]
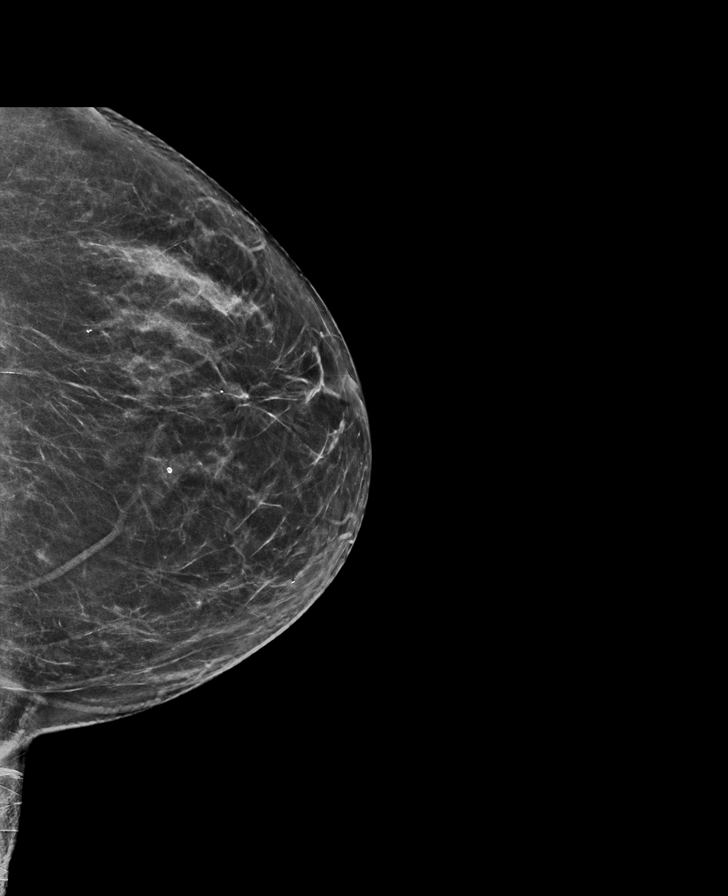

[R MLO synth-2D]
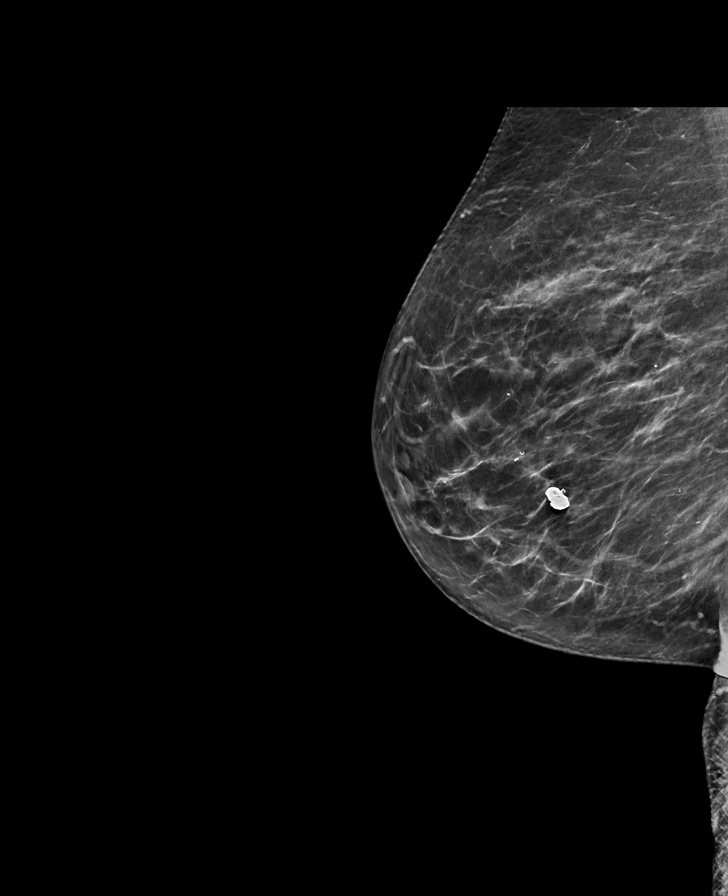

[L CC tomo · tomo slice 35/68.0]
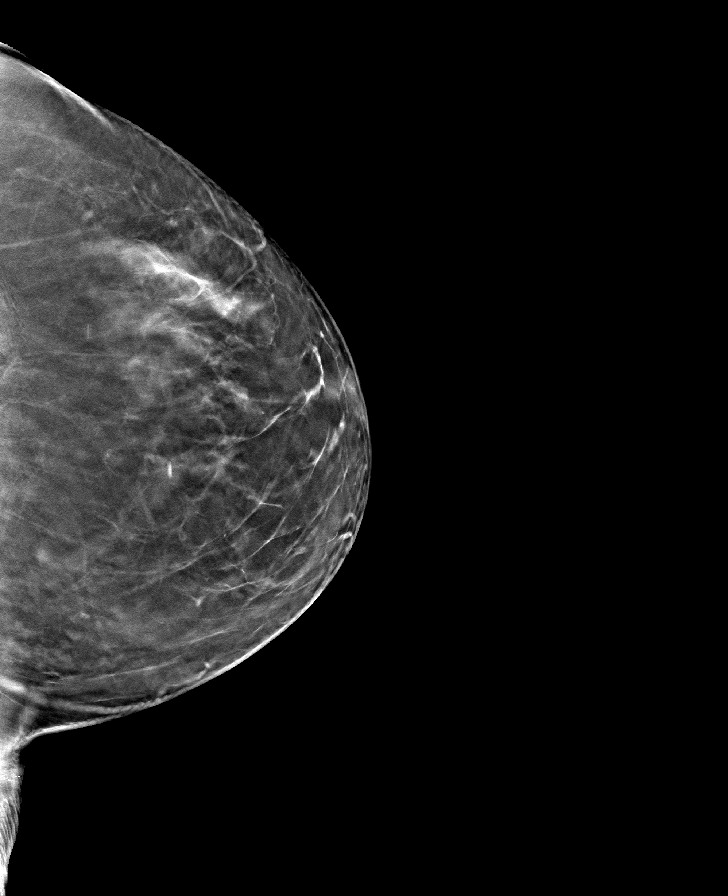

[R CC tomo · tomo slice 38/75.0]
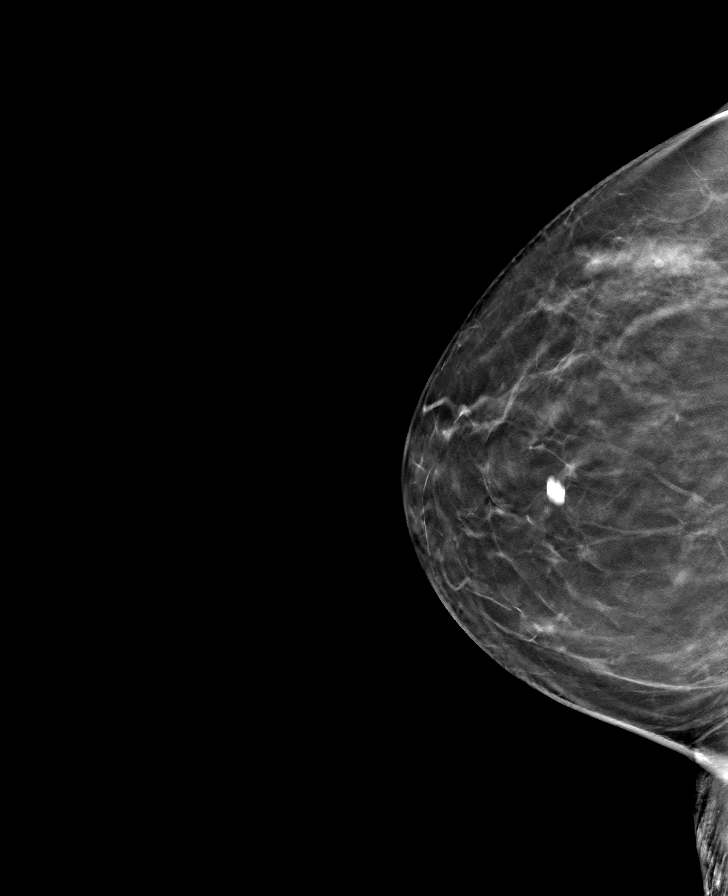

[R MLO tomo · tomo slice 36/71.0]
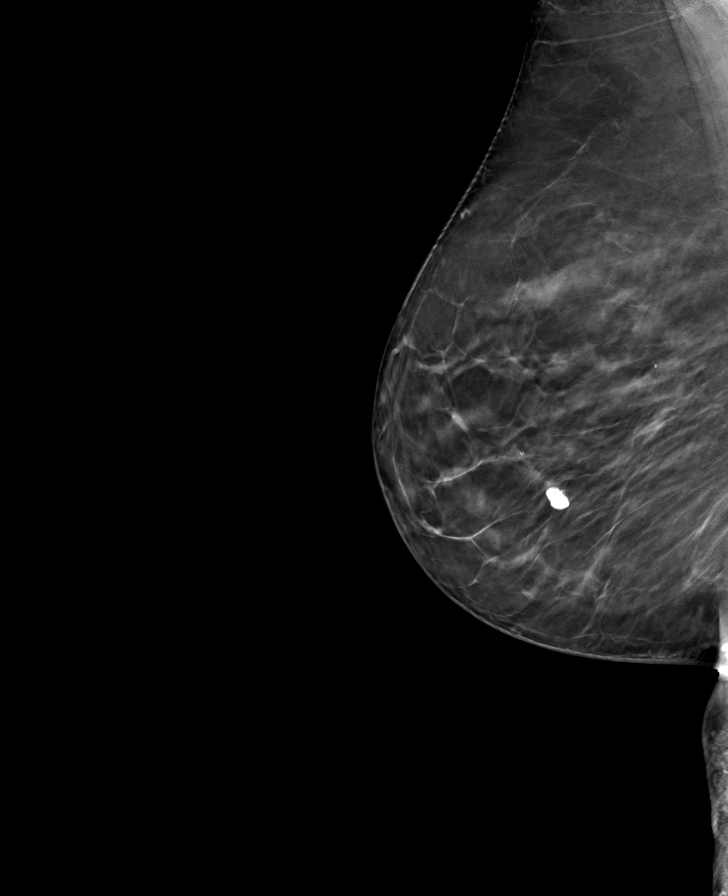

[L MLO tomo · tomo slice 37/73.0]
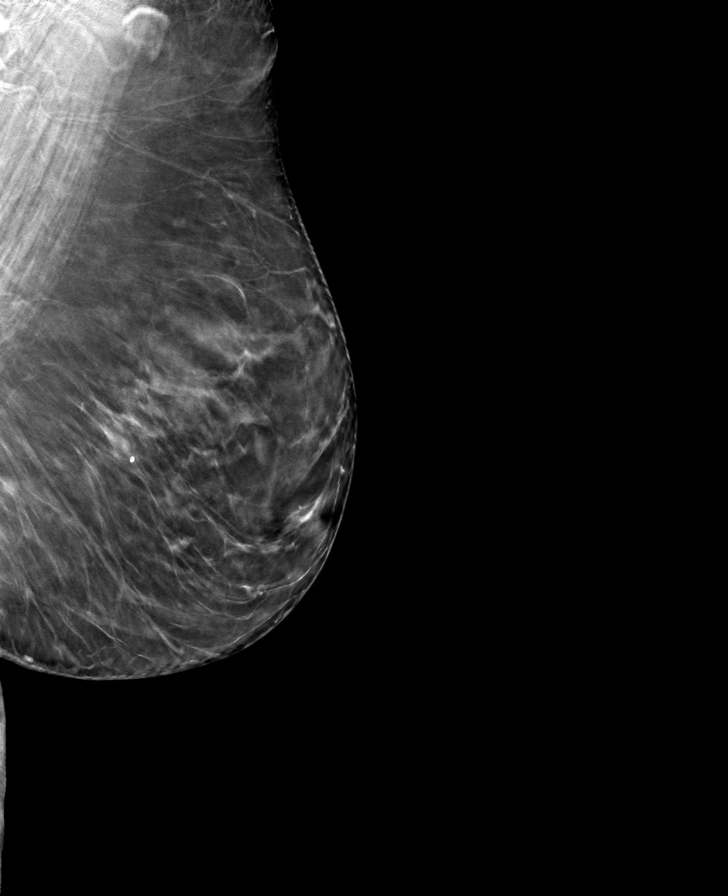

[8 of 24 positions shown; findings below may reference images not displayed]

ACR Breast Density Category b: There are scattered areas of
fibroglandular density.
FINDINGS: Postsurgical changes within the left breast. No new masses,
calcifications or nonsurgical distortion identified within either
breast.

Targeted ultrasound is performed, showing dense tissue without
suspicious mass within the inferior left breast.
IMPRESSION: No mammographic evidence for malignancy.

No concerning abnormality within the left breast at the site of
tenderness.

RECOMMENDATION:
Screening mammogram in one year.(Code:[id number]-[id number]).

Continued clinical evaluation for left breast tenderness.

I have discussed the findings and recommendations with the patient.
If applicable, a reminder letter will be sent to the patient
regarding the next appointment.

BI-RADS CATEGORY  2: Benign.

## 2022-09-03 DIAGNOSIS — M5412 Radiculopathy, cervical region: Secondary | ICD-10-CM | POA: Diagnosis not present

## 2022-09-09 ENCOUNTER — Encounter: Payer: Self-pay | Admitting: Internal Medicine

## 2022-09-09 ENCOUNTER — Ambulatory Visit (INDEPENDENT_AMBULATORY_CARE_PROVIDER_SITE_OTHER): Payer: Medicare HMO | Admitting: Internal Medicine

## 2022-09-09 VITALS — BP 132/84 | HR 94 | Temp 98.1°F | Ht 61.0 in | Wt 141.8 lb

## 2022-09-09 DIAGNOSIS — E785 Hyperlipidemia, unspecified: Secondary | ICD-10-CM

## 2022-09-09 DIAGNOSIS — E1169 Type 2 diabetes mellitus with other specified complication: Secondary | ICD-10-CM

## 2022-09-09 DIAGNOSIS — M25512 Pain in left shoulder: Secondary | ICD-10-CM

## 2022-09-09 DIAGNOSIS — I10 Essential (primary) hypertension: Secondary | ICD-10-CM

## 2022-09-09 MED ORDER — VENLAFAXINE HCL 37.5 MG PO TABS: 37.5000 mg | ORAL_TABLET | Freq: Every day | ORAL | 2 refills | Status: DC

## 2022-09-09 NOTE — Patient Instructions (Signed)

## 2022-09-09 NOTE — Progress Notes (Signed)
Virginia Russell as a Neurosurgeon for Virginia Aliment, MD.,have documented all relevant documentation on the behalf of Virginia Aliment, MD,as directed by  Virginia Aliment, MD while in the presence of Virginia Aliment, MD.  Subjective:  Patient ID: Virginia Russell [facility] , female    DOB: February 24, 1946 , 77 y.o.   MRN: [id number]  Chief Complaint  Patient presents with   Diabetes   Hypertension    HPI  The patient is here today for a follow-up on her diabetes and blood pressure.  She reports compliance with meds. She has no new concerns at this time. Denies headache, chest pain, and SOB.   Diabetes She presents for her follow-up diabetic visit. She has type 2 diabetes mellitus. No MedicAlert identification noted. The initial diagnosis of diabetes was made 1 year ago. Her disease course has been stable. There are no hypoglycemic associated symptoms. Pertinent negatives for diabetes include no blurred vision, no polydipsia, no polyphagia and no polyuria. There are no hypoglycemic complications. Symptoms are stable. Risk factors for coronary artery disease include dyslipidemia, diabetes mellitus, hypertension, post-menopausal and stress. She is compliant with treatment all of the time. She is following a diabetic diet. She participates in exercise intermittently. Her breakfast blood glucose is taken between 7-8 am. Her breakfast blood glucose range is generally 90-110 mg/dl. An ACE inhibitor/angiotensin II receptor blocker is being taken. Eye exam is current.  Hypertension This is a chronic problem. The current episode started more than 1 year ago. The problem has been gradually improving since onset. The problem is controlled. Pertinent negatives include no blurred vision or palpitations. Past treatments include ACE inhibitors and diuretics. The current treatment provides moderate improvement.     Past Medical History:  Diagnosis Date   Anxiety    Aortic stenosis, mild 02/11/2020   Mean gradient 9  mmHg on 11/2019   Carotid stenosis, bilateral 02/11/2020   Diabetes mellitus without complication (HCC)    High cholesterol    HTN (hypertension)    Hyperlipidemia LDL goal <70 02/11/2020     Family History  Problem Relation Age of Onset   Breast cancer Mother    Heart disease Mother    Heart attack Mother    Stroke Father    Heart attack Father    Heart attack Daughter    Heart attack Brother    Heart attack Maternal Grandmother    Heart attack Paternal Grandmother      Current Outpatient Medications:    Accu-Chek Softclix Lancets lancets, Use as instructed to check blood sugars 1 time per day dx: e11.9, Disp: 150 each, Rfl: 3   Alcohol Swabs (B-D SINGLE USE SWABS BUTTERFLY) PADS, 100 Pieces by Does not apply route as needed., Disp: 100 each, Rfl: 3   amLODipine (NORVASC) 5 MG tablet, Take one tab po qpm, Disp: 30 tablet, Rfl: 11   aspirin 81 MG tablet, Take 81 mg by mouth daily., Disp: , Rfl:    atorvastatin (LIPITOR) 40 MG tablet, TAKE 1 TABLET EVERY NIGHT, Disp: 90 tablet, Rfl: 3   Blood Glucose Monitoring Suppl (ACCU-CHEK AVIVA PLUS) w/Device KIT, Use as directed to check blood sugars 1 time per day dx: e11.22, Disp: 1 kit, Rfl: 1   Blood Glucose Monitoring Suppl (ACCU-CHEK GUIDE) w/Device KIT, Use to check blood sugars once daily E11.9, Disp: 1 kit, Rfl: 2   celecoxib (CELEBREX) 200 MG capsule, Take 1 capsule (200 mg total) by mouth 2 (two) times daily., Disp: 90 capsule, Rfl:  2   glucose blood (ACCU-CHEK AVIVA PLUS) test strip, Use as instructed to check blood sugars 1 time per day dx: e11.22, Disp: 150 each, Rfl: 3   glucose blood (ACCU-CHEK GUIDE) test strip, Use as instructed to check blood sugars once daily E11.9, Disp: 100 each, Rfl: 3   losartan (COZAAR) 50 MG tablet, TAKE 1 TABLET EVERY DAY, Disp: 90 tablet, Rfl: 3   metFORMIN (GLUCOPHAGE-XR) 750 MG 24 hr tablet, TAKE 2 TABLETS EVERY EVENING, Disp: 180 tablet, Rfl: 3   sitaGLIPtin (JANUVIA) 100 MG tablet, Take 100 mg by  mouth daily., Disp: , Rfl:    benzonatate (TESSALON) 100 MG capsule, Take 1 capsule (100 mg total) by mouth 3 (three) times daily. (Patient not taking: Reported on 11/11/2021), Disp: 30 capsule, Rfl: 1   venlafaxine (EFFEXOR) 37.5 MG tablet, Take 1 tablet (37.5 mg total) by mouth daily., Disp: 90 tablet, Rfl: 2   Allergies  Allergen Reactions   Codeine      Review of Systems  Constitutional: Negative.   Eyes:  Negative for blurred vision.  Respiratory: Negative.    Cardiovascular: Negative.  Negative for palpitations.  Gastrointestinal: Negative.   Endocrine: Negative for polydipsia, polyphagia and polyuria.  Musculoskeletal:  Positive for arthralgias.  Neurological: Negative.   Psychiatric/Behavioral: Negative.       Today's Vitals   09/09/22 1442  BP: 132/84  Pulse: 94  Temp: 98.1 F (36.7 C)  SpO2: 98%  Weight: 141 lb 12.8 oz (64.3 kg)  Height: 5\' 1"  (1.549 m)   Body mass index is 26.79 kg/m.  Wt Readings from Last 3 Encounters:  09/09/22 141 lb 12.8 oz (64.3 kg)  05/11/22 140 lb 9.6 oz (63.8 kg)  02/23/22 143 lb (64.9 kg)    The 10-year ASCVD risk score (Arnett DK, et al., 2019) is: 46.2%   Values used to calculate the score:     Age: 55 years     Sex: Female     Is Non-Hispanic African American: No     Diabetic: Yes     Tobacco smoker: No     Systolic Blood Pressure: 132 mmHg     Is BP treated: Yes     HDL Cholesterol: 66 mg/dL     Total Cholesterol: 159 mg/dL  Objective:  Physical Exam Vitals and nursing note reviewed.  Constitutional:      Appearance: Normal appearance.  HENT:     Head: Normocephalic and atraumatic.  Eyes:     Extraocular Movements: Extraocular movements intact.  Cardiovascular:     Rate and Rhythm: Normal rate and regular rhythm.     Heart sounds: Normal heart sounds.  Pulmonary:     Effort: Pulmonary effort is normal.     Breath sounds: Normal breath sounds.  Musculoskeletal:        General: Tenderness present.     Cervical  back: Normal range of motion.  Skin:    General: Skin is warm.  Neurological:     General: No focal deficit present.     Mental Status: She is alert.  Psychiatric:        Mood and Affect: Mood normal.        Behavior: Behavior normal.         Assessment And Plan:  1. Dyslipidemia associated with type 2 diabetes mellitus (HCC) Comments: Chronic, LDL goal < 70. She will c/w atorvastatin 40mg  daily, metformin ER 750mg  and Januvia 100mg  daily. We discussed use of SGLT2-inh, this will provide both renal/cardiac  protection.  - Hemoglobin A1c - BMP8+eGFR - TSH  2. Essential hypertension, benign Comments: Chronic, fair control. Likely exacerbated by pain. No med changes today.  She will c/w amlodipine 5mg   3. Acute pain of left shoulder Comments: Started 2 wks ago, currently in w/u w/ DR.Elsner. She agrees to see Ortho if his w/u is negative for worsening cervical spine disease.    Return if symptoms worsen or fail to improve.  Patient was given opportunity to ask questions. Patient verbalized understanding of the plan and was able to repeat key elements of the plan. All questions were answered to their satisfaction.    I, Virginia Aliment, MD, have reviewed all documentation for this visit. The documentation on 09/11/22 for the exam, diagnosis, procedures, and orders are all accurate and complete.   IF YOU HAVE BEEN REFERRED TO A SPECIALIST, IT MAY TAKE 1-2 WEEKS TO SCHEDULE/PROCESS THE REFERRAL. IF YOU HAVE NOT HEARD FROM US/SPECIALIST IN TWO WEEKS, PLEASE GIVE [place] A CALL AT [phone] X [phone].

## 2022-09-10 ENCOUNTER — Other Ambulatory Visit: Payer: Self-pay | Admitting: Neurological Surgery

## 2022-09-10 DIAGNOSIS — M5412 Radiculopathy, cervical region: Secondary | ICD-10-CM

## 2022-09-10 LAB — TSH: TSH: 2.39 u[IU]/mL (ref 0.450–4.500)

## 2022-09-10 LAB — HEMOGLOBIN A1C
Est. average glucose Bld gHb Est-mCnc: 180 mg/dL
Hgb A1c MFr Bld: 7.9 % — ABNORMAL HIGH (ref 4.8–5.6)

## 2022-09-10 LAB — BMP8+EGFR
BUN/Creatinine Ratio: 31 — ABNORMAL HIGH (ref 12–28)
BUN: 21 mg/dL (ref 8–27)
CO2: 23 mmol/L (ref 20–29)
Calcium: 9.5 mg/dL (ref 8.7–10.3)
Chloride: 98 mmol/L (ref 96–106)
Creatinine, Ser: 0.67 mg/dL (ref 0.57–1.00)
Glucose: 114 mg/dL — ABNORMAL HIGH (ref 70–99)
Potassium: 4.4 mmol/L (ref 3.5–5.2)
Sodium: 137 mmol/L (ref 134–144)
eGFR: 90 mL/min/{1.73_m2} (ref >59)

## 2022-09-11 ENCOUNTER — Encounter: Payer: Self-pay | Admitting: Internal Medicine

## 2022-09-17 ENCOUNTER — Ambulatory Visit
Admission: RE | Admit: 2022-09-17 | Discharge: 2022-09-17 | Disposition: A | Discharge status: Home / Self Care | Payer: Medicare HMO | Source: Ambulatory Visit | Attending: Neurological Surgery | Admitting: Neurological Surgery

## 2022-09-17 DIAGNOSIS — M5412 Radiculopathy, cervical region: Secondary | ICD-10-CM

## 2022-09-17 DIAGNOSIS — M542 Cervicalgia: Secondary | ICD-10-CM | POA: Diagnosis not present

## 2022-09-17 DIAGNOSIS — Z981 Arthrodesis status: Secondary | ICD-10-CM | POA: Diagnosis not present

## 2022-09-18 ENCOUNTER — Other Ambulatory Visit: Payer: Self-pay | Admitting: Internal Medicine

## 2022-09-29 DIAGNOSIS — L814 Other melanin hyperpigmentation: Secondary | ICD-10-CM | POA: Diagnosis not present

## 2022-09-29 DIAGNOSIS — D492 Neoplasm of unspecified behavior of bone, soft tissue, and skin: Secondary | ICD-10-CM | POA: Diagnosis not present

## 2022-09-29 DIAGNOSIS — C44329 Squamous cell carcinoma of skin of other parts of face: Secondary | ICD-10-CM | POA: Diagnosis not present

## 2022-10-01 DIAGNOSIS — Z6827 Body mass index (BMI) 27.0-27.9, adult: Secondary | ICD-10-CM | POA: Diagnosis not present

## 2022-10-01 DIAGNOSIS — M5412 Radiculopathy, cervical region: Secondary | ICD-10-CM | POA: Diagnosis not present

## 2022-10-21 DIAGNOSIS — M7918 Myalgia, other site: Secondary | ICD-10-CM | POA: Diagnosis not present

## 2022-10-21 DIAGNOSIS — M542 Cervicalgia: Secondary | ICD-10-CM | POA: Diagnosis not present

## 2022-10-21 DIAGNOSIS — Z6827 Body mass index (BMI) 27.0-27.9, adult: Secondary | ICD-10-CM | POA: Diagnosis not present

## 2022-10-28 ENCOUNTER — Other Ambulatory Visit: Payer: Self-pay | Admitting: Internal Medicine

## 2022-11-03 DIAGNOSIS — D0439 Carcinoma in situ of skin of other parts of face: Secondary | ICD-10-CM | POA: Diagnosis not present

## 2022-11-03 DIAGNOSIS — L578 Other skin changes due to chronic exposure to nonionizing radiation: Secondary | ICD-10-CM | POA: Diagnosis not present

## 2022-11-22 DIAGNOSIS — Z6827 Body mass index (BMI) 27.0-27.9, adult: Secondary | ICD-10-CM | POA: Diagnosis not present

## 2022-11-22 DIAGNOSIS — M7918 Myalgia, other site: Secondary | ICD-10-CM | POA: Diagnosis not present

## 2022-11-24 ENCOUNTER — Telehealth: Payer: Self-pay | Admitting: Internal Medicine

## 2022-11-24 NOTE — Telephone Encounter (Signed)
Patient was called to advise about doing the cologuard screening. No answer, Left VM.

## 2022-12-08 DIAGNOSIS — H40013 Open angle with borderline findings, low risk, bilateral: Secondary | ICD-10-CM | POA: Diagnosis not present

## 2022-12-08 DIAGNOSIS — H10413 Chronic giant papillary conjunctivitis, bilateral: Secondary | ICD-10-CM | POA: Diagnosis not present

## 2022-12-08 DIAGNOSIS — H26493 Other secondary cataract, bilateral: Secondary | ICD-10-CM | POA: Diagnosis not present

## 2022-12-08 DIAGNOSIS — H43813 Vitreous degeneration, bilateral: Secondary | ICD-10-CM | POA: Diagnosis not present

## 2022-12-08 DIAGNOSIS — Z961 Presence of intraocular lens: Secondary | ICD-10-CM | POA: Diagnosis not present

## 2022-12-08 DIAGNOSIS — H0102A Squamous blepharitis right eye, upper and lower eyelids: Secondary | ICD-10-CM | POA: Diagnosis not present

## 2022-12-08 DIAGNOSIS — H0102B Squamous blepharitis left eye, upper and lower eyelids: Secondary | ICD-10-CM | POA: Diagnosis not present

## 2022-12-08 DIAGNOSIS — E119 Type 2 diabetes mellitus without complications: Secondary | ICD-10-CM | POA: Diagnosis not present

## 2022-12-08 LAB — HM DIABETES EYE EXAM

## 2023-01-12 DIAGNOSIS — M1712 Unilateral primary osteoarthritis, left knee: Secondary | ICD-10-CM | POA: Diagnosis not present

## 2023-01-12 DIAGNOSIS — M170 Bilateral primary osteoarthritis of knee: Secondary | ICD-10-CM | POA: Diagnosis not present

## 2023-01-12 DIAGNOSIS — M1711 Unilateral primary osteoarthritis, right knee: Secondary | ICD-10-CM | POA: Diagnosis not present

## 2023-01-17 ENCOUNTER — Encounter: Payer: Self-pay | Admitting: Internal Medicine

## 2023-02-03 ENCOUNTER — Ambulatory Visit: Payer: Self-pay | Admitting: Internal Medicine

## 2023-02-03 NOTE — Progress Notes (Deleted)
I,Alim Cattell T Deloria Lair, CMA,acting as a Neurosurgeon for [name], MD.,have documented all relevant documentation on the behalf of [name], MD,as directed by  [name], MD while in the presence of [name], MD.  Subjective:  Patient ID: Virginia Russell [facility] , female    DOB: [birth year] , 77 y.o.   MRN: [id number]  No chief complaint on file.   HPI  The patient is here today for a follow-up on her diabetes and blood pressure.  She reports compliance with meds. She has no new concerns at this time. Denies headache, chest pain, and SOB.   Diabetes She presents for her follow-up diabetic visit. She has type 2 diabetes mellitus. No MedicAlert identification noted. The initial diagnosis of diabetes was made 1 year ago. Her disease course has been stable. There are no hypoglycemic associated symptoms. Pertinent negatives for diabetes include no blurred vision, no polydipsia, no polyphagia and no polyuria. There are no hypoglycemic complications. Symptoms are stable. Risk factors for coronary artery disease include dyslipidemia, diabetes mellitus, hypertension, post-menopausal and stress. She is compliant with treatment all of the time. She is following a diabetic diet. She participates in exercise intermittently. Her breakfast blood glucose is taken between 7-8 am. Her breakfast blood glucose range is generally 90-110 mg/dl. An ACE inhibitor/angiotensin II receptor blocker is being taken. Eye exam is current.  Hypertension This is a chronic problem. The current episode started more than 1 year ago. The problem has been gradually improving since onset. The problem is controlled. Pertinent negatives include no blurred vision or palpitations. Past treatments include ACE inhibitors and diuretics. The current treatment provides moderate improvement.     Past Medical History:  Diagnosis Date   Anxiety    Aortic stenosis, mild 02/11/2020   Mean gradient 9 mmHg on 11/2019   Carotid  stenosis, bilateral 02/11/2020   Diabetes mellitus without complication (HCC)    High cholesterol    HTN (hypertension)    Hyperlipidemia LDL goal <70 02/11/2020     Family History  Problem Relation Age of Onset   Breast cancer Mother    Heart disease Mother    Heart attack Mother    Stroke Father    Heart attack Father    Heart attack Daughter    Heart attack Brother    Heart attack Maternal Grandmother    Heart attack Paternal Grandmother      Current Outpatient Medications:    Accu-Chek Softclix Lancets lancets, Use as instructed to check blood sugars 1 time per day dx: e11.9, Disp: 150 each, Rfl: 3   Alcohol Swabs (B-D SINGLE USE SWABS BUTTERFLY) PADS, 100 Pieces by Does not apply route as needed., Disp: 100 each, Rfl: 3   amLODipine (NORVASC) 5 MG tablet, TAKE 1 TABLET BY MOUTH EVERY EVENING, Disp: 90 tablet, Rfl: 3   aspirin 81 MG tablet, Take 81 mg by mouth daily., Disp: , Rfl:    atorvastatin (LIPITOR) 40 MG tablet, TAKE 1 TABLET EVERY NIGHT, Disp: 90 tablet, Rfl: 3   benzonatate (TESSALON) 100 MG capsule, Take 1 capsule (100 mg total) by mouth 3 (three) times daily. (Patient not taking: Reported on 11/11/2021), Disp: 30 capsule, Rfl: 1   Blood Glucose Monitoring Suppl (ACCU-CHEK AVIVA PLUS) w/Device KIT, Use as directed to check blood sugars 1 time per day dx: e11.22, Disp: 1 kit, Rfl: 1   Blood Glucose Monitoring Suppl (ACCU-CHEK GUIDE) w/Device KIT, Use to check blood sugars once daily E11.9, Disp: 1 kit,  Rfl: 2   celecoxib (CELEBREX) 200 MG capsule, Take 1 capsule (200 mg total) by mouth 2 (two) times daily., Disp: 90 capsule, Rfl: 2   glucose blood (ACCU-CHEK AVIVA PLUS) test strip, Use as instructed to check blood sugars 1 time per day dx: e11.22, Disp: 150 each, Rfl: 3   glucose blood (ACCU-CHEK GUIDE) test strip, Use as instructed to check blood sugars once daily E11.9, Disp: 100 each, Rfl: 3   losartan (COZAAR) 50 MG tablet, TAKE 1 TABLET EVERY DAY, Disp: 90 tablet, Rfl:  3   metFORMIN (GLUCOPHAGE-XR) 750 MG 24 hr tablet, TAKE 2 TABLETS EVERY EVENING, Disp: 180 tablet, Rfl: 3   sitaGLIPtin (JANUVIA) 100 MG tablet, Take 100 mg by mouth daily., Disp: , Rfl:    venlafaxine (EFFEXOR) 37.5 MG tablet, Take 1 tablet (37.5 mg total) by mouth daily., Disp: 90 tablet, Rfl: 2   Allergies  Allergen Reactions   Codeine      Review of Systems  Constitutional: Negative.   Eyes:  Negative for blurred vision.  Respiratory: Negative.    Cardiovascular: Negative.  Negative for palpitations.  Endocrine: Negative for polydipsia, polyphagia and polyuria.  Neurological: Negative.   Psychiatric/Behavioral: Negative.       There were no vitals filed for this visit. There is no height or weight on file to calculate BMI.  Wt Readings from Last 3 Encounters:  09/09/22 141 lb 12.8 oz (64.3 kg)  05/11/22 140 lb 9.6 oz (63.8 kg)  02/23/22 143 lb (64.9 kg)     Objective:  Physical Exam      Assessment And Plan:  There are no diagnoses linked to this encounter.   No follow-ups on file.  Patient was given opportunity to ask questions. Patient verbalized understanding of the plan and was able to repeat key elements of the plan. All questions were answered to their satisfaction.  [name], MD  I, [name], MD, have reviewed all documentation for this visit. The documentation on 02/03/23 for the exam, diagnosis, procedures, and orders are all accurate and complete.   IF YOU HAVE BEEN REFERRED TO A SPECIALIST, IT MAY TAKE 1-2 WEEKS TO SCHEDULE/PROCESS THE REFERRAL. IF YOU HAVE NOT HEARD FROM US/SPECIALIST IN TWO WEEKS, PLEASE GIVE [place] A CALL AT [phone].   THE PATIENT IS ENCOURAGED TO PRACTICE SOCIAL DISTANCING DUE TO THE COVID-19 PANDEMIC.

## 2023-02-08 ENCOUNTER — Ambulatory Visit (INDEPENDENT_AMBULATORY_CARE_PROVIDER_SITE_OTHER): Payer: Medicare HMO | Admitting: Internal Medicine

## 2023-02-08 ENCOUNTER — Encounter: Payer: Self-pay | Admitting: Internal Medicine

## 2023-02-08 ENCOUNTER — Other Ambulatory Visit: Payer: Self-pay | Admitting: Internal Medicine

## 2023-02-08 VITALS — BP 128/74 | HR 79 | Temp 98.3°F | Ht 61.0 in | Wt 140.8 lb

## 2023-02-08 DIAGNOSIS — E663 Overweight: Secondary | ICD-10-CM

## 2023-02-08 DIAGNOSIS — F3341 Major depressive disorder, recurrent, in partial remission: Secondary | ICD-10-CM

## 2023-02-08 DIAGNOSIS — Z1211 Encounter for screening for malignant neoplasm of colon: Secondary | ICD-10-CM

## 2023-02-08 DIAGNOSIS — M542 Cervicalgia: Secondary | ICD-10-CM

## 2023-02-08 DIAGNOSIS — E1165 Type 2 diabetes mellitus with hyperglycemia: Secondary | ICD-10-CM | POA: Diagnosis not present

## 2023-02-08 DIAGNOSIS — W010XXA Fall on same level from slipping, tripping and stumbling without subsequent striking against object, initial encounter: Secondary | ICD-10-CM | POA: Diagnosis not present

## 2023-02-08 DIAGNOSIS — I10 Essential (primary) hypertension: Secondary | ICD-10-CM

## 2023-02-08 DIAGNOSIS — Z6826 Body mass index (BMI) 26.0-26.9, adult: Secondary | ICD-10-CM

## 2023-02-08 DIAGNOSIS — Z79899 Other long term (current) drug therapy: Secondary | ICD-10-CM

## 2023-02-08 DIAGNOSIS — Z1231 Encounter for screening mammogram for malignant neoplasm of breast: Secondary | ICD-10-CM

## 2023-02-08 MED ORDER — TIZANIDINE HCL 4 MG PO TABS: 4.0000 mg | ORAL_TABLET | Freq: Every day | ORAL | 1 refills | Status: DC | PRN

## 2023-02-08 NOTE — Patient Instructions (Signed)

## 2023-02-08 NOTE — Progress Notes (Signed)
I,[name], CMA,acting as a Neurosurgeon for [name], MD.,have documented all relevant documentation on the behalf of [name], MD,as directed by  [name], MD while in the presence of [name], MD.  Subjective:  Patient ID: Virginia Russell [facility] , female    DOB: 1946-02-10 , 77 y.o.   MRN: [id number]  Chief Complaint  Patient presents with   Diabetes   Hypertension    HPI  The patient is here today for a follow-up on her diabetes and blood pressure.  She reports compliance with meds. She has no new concerns at this time. Denies headache, chest pain, and SOB.   She reports having a fall 2 weeks ago. She thinks this occurred 01/21/23.  She was walking into her front door and tripped over the front door step.  She fell into the house onto the floor.  She states this exacerbated her left shoulder pain. She is already followed by Pain Mgmt.  She admits she was wearing flip flops. She has since thrown away the flip flops. She has used [employer] with some relief.   Diabetes She presents for her follow-up diabetic visit. She has type 2 diabetes mellitus. No MedicAlert identification noted. The initial diagnosis of diabetes was made 1 year ago. Her disease course has been stable. There are no hypoglycemic associated symptoms. Pertinent negatives for diabetes include no blurred vision, no polydipsia, no polyphagia and no polyuria. There are no hypoglycemic complications. Symptoms are stable. Risk factors for coronary artery disease include dyslipidemia, diabetes mellitus, hypertension, post-menopausal and stress. She is compliant with treatment all of the time. She is following a diabetic diet. She participates in exercise intermittently. Her breakfast blood glucose is taken between 7-8 am. Her breakfast blood glucose range is generally 90-110 mg/dl. An ACE inhibitor/angiotensin II receptor blocker is being taken. Eye exam is current.  Hypertension This is a chronic  problem. The current episode started more than 1 year ago. The problem has been gradually improving since onset. The problem is controlled. Associated symptoms include neck pain. Pertinent negatives include no blurred vision or palpitations. Past treatments include ACE inhibitors and diuretics. The current treatment provides moderate improvement.     Past Medical History:  Diagnosis Date   Anxiety    Aortic stenosis, mild 02/11/2020   Mean gradient 9 mmHg on 11/2019   Carotid stenosis, bilateral 02/11/2020   Diabetes mellitus without complication (HCC)    High cholesterol    HTN (hypertension)    Hyperlipidemia LDL goal <70 02/11/2020     Family History  Problem Relation Age of Onset   Breast cancer Mother    Heart disease Mother    Heart attack Mother    Stroke Father    Heart attack Father    Heart attack Daughter    Heart attack Brother    Heart attack Maternal Grandmother    Heart attack Paternal Grandmother      Current Outpatient Medications:    Accu-Chek Softclix Lancets lancets, Use as instructed to check blood sugars 1 time per day dx: e11.9, Disp: 150 each, Rfl: 3   Alcohol Swabs (B-D SINGLE USE SWABS BUTTERFLY) PADS, 100 Pieces by Does not apply route as needed., Disp: 100 each, Rfl: 3   amLODipine (NORVASC) 5 MG tablet, TAKE 1 TABLET BY MOUTH EVERY EVENING, Disp: 90 tablet, Rfl: 3   aspirin 81 MG tablet, Take 81 mg by mouth daily., Disp: , Rfl:    atorvastatin (LIPITOR) 40  MG tablet, TAKE 1 TABLET EVERY NIGHT, Disp: 90 tablet, Rfl: 3   Blood Glucose Monitoring Suppl (ACCU-CHEK AVIVA PLUS) w/Device KIT, Use as directed to check blood sugars 1 time per day dx: e11.22, Disp: 1 kit, Rfl: 1   Blood Glucose Monitoring Suppl (ACCU-CHEK GUIDE) w/Device KIT, Use to check blood sugars once daily E11.9, Disp: 1 kit, Rfl: 2   celecoxib (CELEBREX) 200 MG capsule, Take 1 capsule (200 mg total) by mouth 2 (two) times daily., Disp: 90 capsule, Rfl: 2   glucose blood (ACCU-CHEK AVIVA  PLUS) test strip, Use as instructed to check blood sugars 1 time per day dx: e11.22, Disp: 150 each, Rfl: 3   glucose blood (ACCU-CHEK GUIDE) test strip, Use as instructed to check blood sugars once daily E11.9, Disp: 100 each, Rfl: 3   losartan (COZAAR) 50 MG tablet, TAKE 1 TABLET EVERY DAY, Disp: 90 tablet, Rfl: 3   metFORMIN (GLUCOPHAGE-XR) 750 MG 24 hr tablet, TAKE 2 TABLETS EVERY EVENING, Disp: 180 tablet, Rfl: 3   sitaGLIPtin (JANUVIA) 100 MG tablet, Take 100 mg by mouth daily., Disp: , Rfl:    tiZANidine (ZANAFLEX) 4 MG tablet, Take 1 tablet (4 mg total) by mouth daily as needed for muscle spasms., Disp: 30 tablet, Rfl: 1   venlafaxine (EFFEXOR) 37.5 MG tablet, Take 1 tablet (37.5 mg total) by mouth daily., Disp: 90 tablet, Rfl: 2   benzonatate (TESSALON) 100 MG capsule, Take 1 capsule (100 mg total) by mouth 3 (three) times daily. (Patient not taking: Reported on 11/11/2021), Disp: 30 capsule, Rfl: 1   Allergies  Allergen Reactions   Codeine      Review of Systems  Constitutional: Negative.   Eyes:  Negative for blurred vision.  Respiratory: Negative.    Cardiovascular: Negative.  Negative for palpitations.  Gastrointestinal: Negative.   Endocrine: Negative for polydipsia, polyphagia and polyuria.  Musculoskeletal:  Positive for arthralgias and neck pain.  Neurological: Negative.   Psychiatric/Behavioral: Negative.       Today's Vitals   02/08/23 1523  BP: 128/74  Pulse: 79  Temp: 98.3 F (36.8 C)  SpO2: 98%  Weight: 140 lb 12.8 oz (63.9 kg)  Height: 5\' 1"  (1.549 m)   Body mass index is 26.6 kg/m.  Wt Readings from Last 3 Encounters:  02/08/23 140 lb 12.8 oz (63.9 kg)  09/09/22 141 lb 12.8 oz (64.3 kg)  05/11/22 140 lb 9.6 oz (63.8 kg)     Objective:  Physical Exam Vitals and nursing note reviewed.  Constitutional:      Appearance: Normal appearance.  HENT:     Head: Normocephalic and atraumatic.  Eyes:     General: Scleral icterus: 26.     Extraocular  Movements: Extraocular movements intact.  Cardiovascular:     Rate and Rhythm: Normal rate and regular rhythm.     Heart sounds: Murmur heard.  Pulmonary:     Effort: Pulmonary effort is normal.     Breath sounds: Normal breath sounds.  Musculoskeletal:     Cervical back: Normal range of motion.  Skin:    General: Skin is warm.  Neurological:     General: No focal deficit present.     Mental Status: She is alert.  Psychiatric:        Mood and Affect: Mood normal.        Behavior: Behavior normal.         Assessment And Plan:  Uncontrolled type 2 diabetes mellitus with hyperglycemia (HCC) Assessment & Plan:  Chronic, importance of dietary/medication compliance was discussed with the patient. She is currently taking metfrmin XR 750mg  2 tabs daily and Januvia 100mg  daily. She would benefit from SGLT2i treatment. Will check labs as below and adjust med as needed.   Orders: -     CMP14+EGFR -     Hemoglobin A1c  Essential hypertension, benign Assessment & Plan: Chronic, fair control.  Goal BP<120/80.  She will continue with amlodipine 5mg  daily and losartan 50mg  daily. Encouraged to follow low sodium diet. She will f/u in four months for re-evaluation.   Orders: -     CMP14+EGFR  Fall on same level from slipping, tripping or stumbling, initial encounter Assessment & Plan: Occurred on 01/21/23. I am happy to hear she has thrown away her flip flops. She is encouraged to avoid wearing flip flops.    Cervicalgia Assessment & Plan: She was given rx tizanidine to use nightly prn. She will let me know if her sx persist. If so, would likely benefit from Ortho/PT evaluation. Advised to also use topical pain cream prn.    Recurrent major depressive disorder, in partial remission (HCC) Assessment & Plan: Chronic, currently stable while on venlafaxine 37.5mg  daily. Importance of medication compliance was discussed with the patient.    Overweight with body mass index (BMI) of 26 to  26.9 in adult  Drug therapy -     Vitamin B12  Overweight (BMI 25.0-29.9) Assessment & Plan: BMI 26. She is encouraged to aim for at least 150 minutes of exercise per week.    Other orders -     tiZANidine HCl; Take 1 tablet (4 mg total) by mouth daily as needed for muscle spasms.  Dispense: 30 tablet; Refill: 1     Return in 2 weeks (on 02/22/2023), or if symptoms worsen or fail to improve.  Patient was given opportunity to ask questions. Patient verbalized understanding of the plan and was able to repeat key elements of the plan. All questions were answered to their satisfaction.    I, [name], MD, have reviewed all documentation for this visit. The documentation on 02/08/23 for the exam, diagnosis, procedures, and orders are all accurate and complete.   IF YOU HAVE BEEN REFERRED TO A SPECIALIST, IT MAY TAKE 1-2 WEEKS TO SCHEDULE/PROCESS THE REFERRAL. IF YOU HAVE NOT HEARD FROM US/SPECIALIST IN TWO WEEKS, PLEASE GIVE [place] A CALL AT [phone].   THE PATIENT IS ENCOURAGED TO PRACTICE SOCIAL DISTANCING DUE TO THE COVID-19 PANDEMIC.

## 2023-02-09 ENCOUNTER — Ambulatory Visit: Payer: Medicare HMO

## 2023-02-09 DIAGNOSIS — Z0000 Encounter for general adult medical examination without abnormal findings: Secondary | ICD-10-CM | POA: Diagnosis not present

## 2023-02-09 LAB — CMP14+EGFR
ALT: 13 [IU]/L (ref 0–32)
AST: 15 [IU]/L (ref 0–40)
Albumin: 4.1 g/dL (ref 3.8–4.8)
Alkaline Phosphatase: 71 [IU]/L (ref 44–121)
BUN/Creatinine Ratio: 29 — ABNORMAL HIGH (ref 12–28)
BUN: 16 mg/dL (ref 8–27)
Bilirubin Total: 0.8 mg/dL (ref 0.0–1.2)
CO2: 25 mmol/L (ref 20–29)
Calcium: 9.7 mg/dL (ref 8.7–10.3)
Chloride: 101 mmol/L (ref 96–106)
Creatinine, Ser: 0.55 mg/dL — ABNORMAL LOW (ref 0.57–1.00)
Globulin, Total: 2.4 g/dL (ref 1.5–4.5)
Glucose: 110 mg/dL — ABNORMAL HIGH (ref 70–99)
Potassium: 4.9 mmol/L (ref 3.5–5.2)
Sodium: 140 mmol/L (ref 134–144)
Total Protein: 6.5 g/dL (ref 6.0–8.5)
eGFR: 94 mL/min/{1.73_m2} (ref >59)

## 2023-02-09 LAB — HEMOGLOBIN A1C
Est. average glucose Bld gHb Est-mCnc: 214 mg/dL
Hgb A1c MFr Bld: 9.1 % — ABNORMAL HIGH (ref 4.8–5.6)

## 2023-02-09 LAB — VITAMIN B12: Vitamin B-12: 459 pg/mL (ref 232–1245)

## 2023-02-09 NOTE — Progress Notes (Signed)
Subjective:   Virginia Russell is a 77 y.o. female who presents for Medicare Annual (Subsequent) preventive examination.  Visit Complete: Virtual I connected with  Orrin Brigham Magnussen on 02/09/23 by a audio enabled telemedicine application and verified that I am speaking with the correct person using two identifiers.  Patient Location: Home  Provider Location: Office/Clinic  I discussed the limitations of evaluation and management by telemedicine. The patient expressed understanding and agreed to proceed.  Vital Signs: Because this visit was a virtual/telehealth visit, some criteria may be missing or patient reported. Any vitals not documented were not able to be obtained and vitals that have been documented are patient reported.    Cardiac Risk Factors include: advanced age (>63men, >66 women);diabetes mellitus;hypertension;dyslipidemia     Objective:    Today's Vitals   02/09/23 1536  PainSc: 7    There is no height or weight on file to calculate BMI.     02/09/2023    3:41 PM 01/28/2022    9:41 AM 12/31/2020    8:37 AM 03/13/2020    2:16 PM 03/07/2019    4:12 PM  Advanced Directives  Does Patient Have a Medical Advance Directive? Yes Yes Yes Yes Yes  Type of Estate agent of [place];Living will Healthcare Power of [place];Living will Healthcare Power of [place];Living will Healthcare Power of [place];Living will Healthcare Power of [place];Living will  Copy of Healthcare Power of Attorney in Chart? No - copy requested No - copy requested No - copy requested No - copy requested No - copy requested    Current Medications (verified) Outpatient Encounter Medications as of 02/09/2023  Medication Sig   Accu-Chek Softclix Lancets lancets Use as instructed to check blood sugars 1 time per day dx: e11.9   Alcohol Swabs (B-D SINGLE USE SWABS BUTTERFLY) PADS 100 Pieces by Does not apply route as needed.   amLODipine (NORVASC) 5 MG tablet TAKE 1  TABLET BY MOUTH EVERY EVENING   aspirin 81 MG tablet Take 81 mg by mouth daily.   atorvastatin (LIPITOR) 40 MG tablet TAKE 1 TABLET EVERY NIGHT   Blood Glucose Monitoring Suppl (ACCU-CHEK AVIVA PLUS) w/Device KIT Use as directed to check blood sugars 1 time per day dx: e11.22   Blood Glucose Monitoring Suppl (ACCU-CHEK GUIDE) w/Device KIT Use to check blood sugars once daily E11.9   celecoxib (CELEBREX) 200 MG capsule Take 1 capsule (200 mg total) by mouth 2 (two) times daily.   glucose blood (ACCU-CHEK AVIVA PLUS) test strip Use as instructed to check blood sugars 1 time per day dx: e11.22   glucose blood (ACCU-CHEK GUIDE) test strip Use as instructed to check blood sugars once daily E11.9   losartan (COZAAR) 50 MG tablet TAKE 1 TABLET EVERY DAY   metFORMIN (GLUCOPHAGE-XR) 750 MG 24 hr tablet TAKE 2 TABLETS EVERY EVENING   sitaGLIPtin (JANUVIA) 100 MG tablet Take 100 mg by mouth daily.   tiZANidine (ZANAFLEX) 4 MG tablet Take 1 tablet (4 mg total) by mouth daily as needed for muscle spasms.   venlafaxine (EFFEXOR) 37.5 MG tablet Take 1 tablet (37.5 mg total) by mouth daily.   benzonatate (TESSALON) 100 MG capsule Take 1 capsule (100 mg total) by mouth 3 (three) times daily. (Patient not taking: Reported on 11/11/2021)   No facility-administered encounter medications on file as of 02/09/2023.    Allergies (verified) Codeine   History: Past Medical History:  Diagnosis Date   Anxiety    Aortic stenosis, mild 02/11/2020  Mean gradient 9 mmHg on 11/2019   Carotid stenosis, bilateral 02/11/2020   Diabetes mellitus without complication (HCC)    High cholesterol    HTN (hypertension)    Hyperlipidemia LDL goal <70 02/11/2020   Past Surgical History:  Procedure Laterality Date   ABDOMINAL HYSTERECTOMY     ABDOMINAL HYSTERECTOMY   BACK SURGERY     BREAST BIOPSY Left    CATARACT EXTRACTION, BILATERAL Right 04/15/2022   R performed on 04/29/22   NECK SURGERY     SHOULDER SURGERY     X2    Family History  Problem Relation Age of Onset   Breast cancer Mother    Heart disease Mother    Heart attack Mother    Stroke Father    Heart attack Father    Heart attack Daughter    Heart attack Brother    Heart attack Maternal Grandmother    Heart attack Paternal Grandmother    Social History   Socioeconomic History   Marital status: Married    Spouse name: Not on file   Number of children: 3   Years of education: Not on file   Highest education level: Not on file  Occupational History   Occupation: house cleaner  Tobacco Use   Smoking status: Never   Smokeless tobacco: Never  Vaping Use   Vaping status: Never Used  Substance and Sexual Activity   Alcohol use: No   Drug use: Never   Sexual activity: Not Currently  Other Topics Concern   Not on file  Social History Narrative   Not on file   Social Determinants of Health   Financial Resource Strain: Low Risk  (02/09/2023)   Overall Financial Resource Strain (CARDIA)    Difficulty of Paying Living Expenses: Not hard at all  Food Insecurity: No Food Insecurity (02/09/2023)   Hunger Vital Sign    Worried About Running Out of Food in the Last Year: Never true    Ran Out of Food in the Last Year: Never true  Transportation Needs: No Transportation Needs (02/09/2023)   PRAPARE - Administrator, Civil Service (Medical): No    Lack of Transportation (Non-Medical): No  Physical Activity: Sufficiently Active (02/09/2023)   Exercise Vital Sign    Days of Exercise per Week: 5 days    Minutes of Exercise per Session: 90 min  Stress: No Stress Concern Present (02/09/2023)   [employer] of Occupational Health - Occupational Stress Questionnaire    Feeling of Stress : Not at all  Social Connections: Moderately Integrated (02/09/2023)   Social Connection and Isolation Panel [NHANES]    Frequency of Communication with Friends and Family: More than three times a week    Frequency of Social Gatherings with  Friends and Family: Never    Attends Religious Services: More than 4 times per year    Active Member of [employer] or Organizations: No    Attends Engineer, structural: Never    Marital Status: Married    Tobacco Counseling Counseling given: Not Answered   Clinical Intake:  Pre-visit preparation completed: Yes  Pain : 0-10 Pain Score: 7  Pain Type: Chronic pain Pain Location: Shoulder Pain Orientation: Left Pain Descriptors / Indicators: Aching Pain Onset: More than a month ago Pain Frequency: Intermittent     Nutritional Risks: None Diabetes: Yes CBG done?: No Did pt. bring in CBG monitor from home?: No  How often do you need to have someone help you when you  read instructions, pamphlets, or other written materials from your doctor or pharmacy?: 1 - Never  Interpreter Needed?: No  Information entered by :: NAllen LPN   Activities of Daily Living    02/09/2023    3:37 PM  In your present state of health, do you have any difficulty performing the following activities:  Hearing? 0  Vision? 0  Difficulty concentrating or making decisions? 0  Walking or climbing stairs? 0  Dressing or bathing? 0  Doing errands, shopping? 0  Preparing Food and eating ? N  Using the Toilet? N  In the past six months, have you accidently leaked urine? N  Do you have problems with loss of bowel control? N  Managing your Medications? N  Managing your Finances? N  Housekeeping or managing your Housekeeping? N    Patient Care Team: Dorothyann Peng, MD as PCP - General (Internal Medicine) Chilton Si, MD as PCP - Cardiology (Cardiology)  Indicate any recent Medical Services you may have received from other than [facility] providers in the past year (date may be approximate).     Assessment:   This is a routine wellness examination for [place].  Hearing/Vision screen Hearing Screening - Comments:: Denies hearing issues Vision Screening - Comments:: Regular eye exams, [facility]   Goals Addressed             This Visit's Progress    Patient Stated       02/09/2023, trying to keep weight and sugar down       Depression Screen    02/09/2023    3:43 PM 02/08/2023    3:22 PM 09/09/2022    2:43 PM 05/11/2022    2:46 PM 02/23/2022    8:13 AM 01/28/2022    9:42 AM 11/11/2021    8:59 AM  PHQ 2/9 Scores  PHQ - 2 Score 0 0 0 0 0 0 0  PHQ- 9 Score 0 0 0  0  0    Fall Risk    02/09/2023    3:42 PM 02/08/2023    3:22 PM 09/09/2022    2:43 PM 05/11/2022    2:45 PM 02/23/2022    8:13 AM  Fall Risk   Falls in the past year? 1 1 0 0 0  Comment tripped coming in front door      Number falls in past yr: 1 0 0 0 0  Injury with Fall? 0 0 0 0 0  Risk for fall due to : History of fall(s);Medication side effect History of fall(s) No Fall Risks No Fall Risks No Fall Risks  Follow up Falls prevention discussed;Falls evaluation completed Falls evaluation completed Falls evaluation completed Falls evaluation completed Falls evaluation completed    MEDICARE RISK AT HOME: Medicare Risk at Home Any stairs in or around the home?: Yes If so, are there any without handrails?: Yes Home free of loose throw rugs in walkways, pet beds, electrical cords, etc?: Yes Adequate lighting in your home to reduce risk of falls?: Yes Life alert?: No Use of a cane, walker or w/c?: No Grab bars in the bathroom?: Yes Shower chair or bench in shower?: Yes Elevated toilet seat or a handicapped toilet?: No  TIMED UP AND GO:  Was the test performed?  No    Cognitive Function:        02/09/2023    3:43 PM 01/28/2022    9:43 AM 12/31/2020    8:39 AM 03/13/2020    2:18 PM 03/07/2019  4:15 PM  6CIT Screen  What Year? 0 points 0 points 0 points 0 points 0 points  What month? 0 points 0 points 0 points 0 points 0 points  What time? 0 points 0 points 0 points 0 points 0 points  Count back from 20 0 points 0 points 0 points 0 points 0 points  Months in reverse 0 points 0 points 0 points 0  points 4 points  Repeat phrase 0 points 0 points 6 points 6 points 8 points  Total Score 0 points 0 points 6 points 6 points 12 points    Immunizations Immunization History  Administered Date(s) Administered   Fluad Quad(high Dose 65+) 12/25/2018, 01/08/2021, 12/12/2021   Influenza, High Dose Seasonal PF 12/18/2019, 01/05/2023   PFIZER(Purple Top)SARS-COV-2 Vaccination 05/03/2019, 05/25/2019, 02/01/2020   Pfizer Covid-19 Vaccine Bivalent Booster 69yrs & up 03/04/2021   Pneumococcal Conjugate-13 12/19/2017   Pneumococcal Polysaccharide-23 12/25/2018   Tdap 05/11/2022   Zoster Recombinant(Shingrix) 04/27/2021, 11/11/2021   Zoster, Live 12/21/2013    TDAP status: Up to date  Flu Vaccine status: Up to date  Pneumococcal vaccine status: Up to date  Covid-19 vaccine status: Information provided on how to obtain vaccines.   Qualifies for Shingles Vaccine? Yes   Zostavax completed Yes   Shingrix Completed?: Yes  Screening Tests Health Maintenance  Topic Date Due   FOOT EXAM  11/12/2022   COVID-19 Vaccine (5 - 2023-24 season) 02/25/2023 (Originally 12/05/2022)   HEMOGLOBIN A1C  03/11/2023   Diabetic kidney evaluation - Urine ACR  05/12/2023   Diabetic kidney evaluation - eGFR measurement  09/09/2023   OPHTHALMOLOGY EXAM  12/08/2023   Medicare Annual Wellness (AWV)  02/09/2024   DTaP/Tdap/Td (2 - Td or Tdap) 05/11/2032   Pneumonia Vaccine 59+ Years old  Completed   INFLUENZA VACCINE  Completed   DEXA SCAN  Completed   Hepatitis C Screening  Completed   Zoster Vaccines- Shingrix  Completed   HPV VACCINES  Aged Out   Colonoscopy  Discontinued   Fecal DNA (Cologuard)  Discontinued    Health Maintenance  Health Maintenance Due  Topic Date Due   FOOT EXAM  11/12/2022    Colorectal cancer screening: No longer required.   Mammogram status: scheduled for 02/28/2023  Bone Density status: Completed 04/04/2019.   Lung Cancer Screening: (Low Dose CT Chest recommended if Age  54-80 years, 20 pack-year currently smoking OR have quit w/in 15years.) does not qualify.   Lung Cancer Screening Referral: no  Additional Screening:  Hepatitis C Screening: does qualify; Completed 05/15/2018  Vision Screening: Recommended annual ophthalmology exams for early detection of glaucoma and other disorders of the eye. Is the patient up to date with their annual eye exam?  Yes  Who is the provider or what is the name of the office in which the patient attends annual eye exams? [facility] If pt is not established with a provider, would they like to be referred to a provider to establish care? No .   Dental Screening: Recommended annual dental exams for proper oral hygiene  Diabetic Foot Exam: Diabetic Foot Exam: Overdue, Pt has been advised about the importance in completing this exam. Pt is scheduled for diabetic foot exam on next appointment.  Community Resource Referral / Chronic Care Management: CRR required this visit?  No   CCM required this visit?  No     Plan:     I have personally reviewed and noted the following in the patient's chart:  Medical and social history Use of alcohol, tobacco or illicit drugs  Current medications and supplements including opioid prescriptions. Patient is not currently taking opioid prescriptions. Functional ability and status Nutritional status Physical activity Advanced directives List of other physicians Hospitalizations, surgeries, and ER visits in previous 12 months Vitals Screenings to include cognitive, depression, and falls Referrals and appointments  In addition, I have reviewed and discussed with patient certain preventive protocols, quality metrics, and best practice recommendations. A written personalized care plan for preventive services as well as general preventive health recommendations were provided to patient.     Barb Merino, LPN   [id number]   After Visit Summary: (MyChart) Due to this being a  telephonic visit, the after visit summary with patients personalized plan was offered to patient via MyChart   Nurse Notes: none

## 2023-02-09 NOTE — Patient Instructions (Signed)
Virginia Russell , Thank you for taking time to come for your Medicare Wellness Visit. I appreciate your ongoing commitment to your health goals. Please review the following plan we discussed and let me know if I can assist you in the future.   Referrals/Orders/Follow-Ups/Clinician Recommendations: none  This is a list of the screening recommended for you and due dates:  Health Maintenance  Topic Date Due   Complete foot exam   11/12/2022   COVID-19 Vaccine (5 - 2023-24 season) 02/25/2023*   Hemoglobin A1C  03/11/2023   Yearly kidney health urinalysis for diabetes  05/12/2023   Yearly kidney function blood test for diabetes  09/09/2023   Eye exam for diabetics  12/08/2023   Medicare Annual Wellness Visit  02/09/2024   DTaP/Tdap/Td vaccine (2 - Td or Tdap) 05/11/2032   Pneumonia Vaccine  Completed   Flu Shot  Completed   DEXA scan (bone density measurement)  Completed   Hepatitis C Screening  Completed   Zoster (Shingles) Vaccine  Completed   HPV Vaccine  Aged Out   Colon Cancer Screening  Discontinued   Cologuard (Stool DNA test)  Discontinued  *Topic was postponed. The date shown is not the original due date.    Advanced directives: (Copy Requested) Please bring a copy of your health care power of attorney and living will to the office to be added to your chart at your convenience.  Next Medicare Annual Wellness Visit scheduled for next year: No, office will schedule appointment  Insert Preventive Care attachment Insert FALL PREVENTION attachment if needed

## 2023-02-13 ENCOUNTER — Encounter: Payer: Self-pay | Admitting: Internal Medicine

## 2023-02-13 DIAGNOSIS — W010XXA Fall on same level from slipping, tripping and stumbling without subsequent striking against object, initial encounter: Secondary | ICD-10-CM | POA: Insufficient documentation

## 2023-02-13 DIAGNOSIS — M542 Cervicalgia: Secondary | ICD-10-CM | POA: Insufficient documentation

## 2023-02-13 NOTE — Assessment & Plan Note (Signed)
She was given rx tizanidine to use nightly prn. She will let me know if her sx persist. If so, would likely benefit from Ortho/PT evaluation. Advised to also use topical pain cream prn.

## 2023-02-13 NOTE — Assessment & Plan Note (Signed)
Occurred on 01/21/23. I am happy to hear she has thrown away her flip flops. She is encouraged to avoid wearing flip flops.

## 2023-02-13 NOTE — Assessment & Plan Note (Signed)
Chronic, fair control.  Goal BP<120/80.  She will continue with amlodipine 5mg  daily and losartan 50mg  daily. Encouraged to follow low sodium diet. She will f/u in four months for re-evaluation.

## 2023-02-13 NOTE — Assessment & Plan Note (Signed)
Chronic, importance of dietary/medication compliance was discussed with the patient. She is currently taking metfrmin XR 750mg  2 tabs daily and Januvia 100mg  daily. She would benefit from SGLT2i treatment. Will check labs as below and adjust med as needed.

## 2023-02-13 NOTE — Assessment & Plan Note (Signed)
Chronic, currently stable while on venlafaxine 37.5mg  daily. Importance of medication compliance was discussed with the patient.

## 2023-02-13 NOTE — Assessment & Plan Note (Signed)
BMI 26. She is encouraged to aim for at least 150 minutes of exercise per week.

## 2023-02-14 ENCOUNTER — Encounter: Payer: Self-pay | Admitting: Internal Medicine

## 2023-02-14 ENCOUNTER — Other Ambulatory Visit: Payer: Self-pay

## 2023-02-14 MED ORDER — ACCU-CHEK GUIDE W/DEVICE KIT: PACK | 2 refills | Status: DC

## 2023-02-14 MED ORDER — ACCU-CHEK SOFTCLIX LANCETS MISC: 3 refills | Status: AC

## 2023-02-21 ENCOUNTER — Other Ambulatory Visit: Payer: Self-pay

## 2023-02-21 DIAGNOSIS — E1165 Type 2 diabetes mellitus with hyperglycemia: Secondary | ICD-10-CM

## 2023-02-21 MED ORDER — GLUCOSE BLOOD VI STRP: ORAL_STRIP | 12 refills | Status: DC

## 2023-02-22 ENCOUNTER — Other Ambulatory Visit: Payer: Self-pay

## 2023-02-22 MED ORDER — ACCU-CHEK AVIVA PLUS VI STRP: ORAL_STRIP | 3 refills | Status: AC

## 2023-02-28 ENCOUNTER — Ambulatory Visit
Admission: RE | Admit: 2023-02-28 | Discharge: 2023-02-28 | Disposition: A | Discharge status: Home / Self Care | Payer: Medicare HMO | Source: Ambulatory Visit | Attending: Internal Medicine | Admitting: Internal Medicine

## 2023-02-28 DIAGNOSIS — Z1231 Encounter for screening mammogram for malignant neoplasm of breast: Secondary | ICD-10-CM | POA: Diagnosis not present

## 2023-03-08 ENCOUNTER — Other Ambulatory Visit: Payer: Self-pay | Admitting: Internal Medicine

## 2023-04-01 ENCOUNTER — Other Ambulatory Visit (HOSPITAL_COMMUNITY): Payer: Self-pay

## 2023-04-04 ENCOUNTER — Other Ambulatory Visit (HOSPITAL_COMMUNITY): Payer: Self-pay

## 2023-04-05 ENCOUNTER — Other Ambulatory Visit (HOSPITAL_COMMUNITY): Payer: Self-pay

## 2023-04-05 MED ORDER — GLUCOSE BLOOD VI STRP
ORAL_STRIP | 3 refills | Status: DC
  Filled 2023-04-05: qty 150, 150d supply, fill #0

## 2023-04-14 ENCOUNTER — Other Ambulatory Visit: Payer: Self-pay

## 2023-04-14 MED ORDER — ACCU-CHEK GUIDE W/DEVICE KIT: PACK | 2 refills | Status: AC

## 2023-04-19 ENCOUNTER — Other Ambulatory Visit: Payer: Self-pay

## 2023-04-19 DIAGNOSIS — E1165 Type 2 diabetes mellitus with hyperglycemia: Secondary | ICD-10-CM

## 2023-04-19 MED ORDER — ONETOUCH VERIO W/DEVICE KIT: PACK | 1 refills | Status: DC

## 2023-04-19 MED ORDER — ONETOUCH VERIO VI STRP: ORAL_STRIP | 2 refills | Status: DC

## 2023-04-19 MED ORDER — ONETOUCH ULTRASOFT LANCETS MISC: 2 refills | Status: DC

## 2023-05-18 ENCOUNTER — Ambulatory Visit (INDEPENDENT_AMBULATORY_CARE_PROVIDER_SITE_OTHER): Payer: Medicare HMO | Admitting: Internal Medicine

## 2023-05-18 ENCOUNTER — Encounter: Payer: Self-pay | Admitting: Internal Medicine

## 2023-05-18 VITALS — BP 124/80 | HR 80 | Ht 61.0 in | Wt 144.2 lb

## 2023-05-18 DIAGNOSIS — I10 Essential (primary) hypertension: Secondary | ICD-10-CM | POA: Diagnosis not present

## 2023-05-18 DIAGNOSIS — E785 Hyperlipidemia, unspecified: Secondary | ICD-10-CM | POA: Diagnosis not present

## 2023-05-18 DIAGNOSIS — D692 Other nonthrombocytopenic purpura: Secondary | ICD-10-CM | POA: Insufficient documentation

## 2023-05-18 DIAGNOSIS — E1169 Type 2 diabetes mellitus with other specified complication: Secondary | ICD-10-CM | POA: Diagnosis not present

## 2023-05-18 DIAGNOSIS — Z0000 Encounter for general adult medical examination without abnormal findings: Secondary | ICD-10-CM

## 2023-05-18 DIAGNOSIS — F3341 Major depressive disorder, recurrent, in partial remission: Secondary | ICD-10-CM

## 2023-05-18 DIAGNOSIS — E1165 Type 2 diabetes mellitus with hyperglycemia: Secondary | ICD-10-CM | POA: Diagnosis not present

## 2023-05-18 LAB — POCT URINALYSIS DIPSTICK
Bilirubin, UA: NEGATIVE
Blood, UA: NEGATIVE
Glucose, UA: NEGATIVE
Ketones, UA: NEGATIVE
Leukocytes, UA: NEGATIVE
Nitrite, UA: NEGATIVE
Protein, UA: NEGATIVE
Spec Grav, UA: 1.02 (ref 1.010–1.025)
Urobilinogen, UA: 0.2 U/dL
pH, UA: 5.5 (ref 5.0–8.0)

## 2023-05-18 MED ORDER — METFORMIN HCL ER 750 MG PO TB24: 750.0000 mg | ORAL_TABLET | Freq: Every day | ORAL | 3 refills | Status: DC

## 2023-05-18 MED ORDER — INSULIN LISPRO (1 UNIT DIAL) 100 UNIT/ML (KWIKPEN): PEN_INJECTOR | SUBCUTANEOUS | 1 refills | Status: DC

## 2023-05-18 MED ORDER — LOSARTAN POTASSIUM 50 MG PO TABS: 50.0000 mg | ORAL_TABLET | Freq: Every day | ORAL | 3 refills | Status: DC

## 2023-05-18 MED ORDER — ATORVASTATIN CALCIUM 40 MG PO TABS: ORAL_TABLET | ORAL | 3 refills | Status: DC

## 2023-05-18 NOTE — Assessment & Plan Note (Signed)

## 2023-05-18 NOTE — Patient Instructions (Signed)

## 2023-05-18 NOTE — Assessment & Plan Note (Signed)
Chronic, sx stable on Effexor. No med changes.

## 2023-05-18 NOTE — Assessment & Plan Note (Addendum)
Chronic, importance of dietary/medication compliance was discussed with the patient. She is currently taking metfrmin XR 750mg  2 tabs daily and Januvia 100mg  daily. She has yet to receive patient assistance for Januvia this year; therefore, she was given samples. I also reached out to  [facility] pharmacist for assistance.  She would likely benefit from SGLT2i treatment, she doesn't wish to make med changes at this time. Will check labs as below and adjust med as needed. I DISCUSSED WITH THE PATIENT AT LENGTH REGARDING THE GOALS OF GLYCEMIC CONTROL AND POSSIBLE LONG-TERM COMPLICATIONS.  I  ALSO STRESSED THE IMPORTANCE OF COMPLIANCE WITH HOME GLUCOSE MONITORING, DIETARY RESTRICTIONS INCLUDING AVOIDANCE OF SUGARY DRINKS/PROCESSED FOODS,  ALONG WITH REGULAR EXERCISE.  I  ALSO STRESSED THE IMPORTANCE OF ANNUAL EYE EXAMS, SELF FOOT CARE AND COMPLIANCE WITH OFFICE VISITS.

## 2023-05-18 NOTE — Assessment & Plan Note (Signed)
Chronic, fair control.  Goal BP<120/80.  EKG performed, NSR w/o acute changes. She will continue with amlodipine 5mg  daily and losartan 50mg  daily. Encouraged to follow low sodium diet. She will f/u in four months for re-evaluation.

## 2023-05-18 NOTE — Progress Notes (Signed)
I,Virginia Russell, CMA,acting as a Neurosurgeon for Virginia Aliment, MD.,have documented all relevant documentation on the behalf of Virginia Aliment, MD,as directed by  Virginia Aliment, MD while in the presence of Virginia Aliment, MD.  Subjective:    Patient ID: Virginia Russell [facility] , female    DOB: Jun 13, 1945 , 78 y.o.   MRN: [id number]  Chief Complaint  Patient presents with   Annual Exam   Diabetes   Hypertension    HPI  She is here today for a full physical examination. She is no longer followed by GYN. She has no specific concerns or complaints at this time. She reports compliance with meds. She denies having any headaches, chest pain and shortness of breath.     Diabetes She presents for her follow-up diabetic visit. She has type 2 diabetes mellitus. Her disease course has been stable. There are no hypoglycemic associated symptoms. Pertinent negatives for diabetes include no blurred vision and no chest pain. There are no hypoglycemic complications. Risk factors for coronary artery disease include dyslipidemia, diabetes mellitus, hypertension, post-menopausal and stress. She is compliant with treatment most of the time. She is following a diabetic diet. She participates in exercise intermittently. Her breakfast blood glucose is taken between 7-8 am. Her breakfast blood glucose range is generally 90-110 mg/dl. An ACE inhibitor/angiotensin II receptor blocker is being taken. Eye exam is current.  Hypertension This is a chronic problem. The current episode started more than 1 year ago. The problem has been gradually improving since onset. The problem is controlled. Pertinent negatives include no blurred vision, chest pain, palpitations or shortness of breath. Past treatments include ACE inhibitors and diuretics. The current treatment provides moderate improvement.     Past Medical History:  Diagnosis Date   Anxiety    Aortic stenosis, mild 02/11/2020   Mean gradient 9 mmHg on 11/2019    Carotid stenosis, bilateral 02/11/2020   Diabetes mellitus without complication (HCC)    High cholesterol    HTN (hypertension)    Hyperlipidemia LDL goal <70 02/11/2020     Family History  Problem Relation Age of Onset   Breast cancer Mother    Heart disease Mother    Heart attack Mother    Stroke Father    Heart attack Father    Heart attack Daughter    Heart attack Brother    Heart attack Maternal Grandmother    Heart attack Paternal Grandmother      Current Outpatient Medications:    Accu-Chek Softclix Lancets lancets, Use as instructed to check blood sugars 1 time per day dx: e11.9, Disp: 150 each, Rfl: 3   Alcohol Swabs (B-D SINGLE USE SWABS BUTTERFLY) PADS, 100 Pieces by Does not apply route as needed., Disp: 100 each, Rfl: 3   amLODipine (NORVASC) 5 MG tablet, TAKE 1 TABLET BY MOUTH EVERY EVENING, Disp: 90 tablet, Rfl: 3   aspirin 81 MG tablet, Take 81 mg by mouth daily., Disp: , Rfl:    Blood Glucose Monitoring Suppl (ACCU-CHEK AVIVA PLUS) w/Device KIT, Use as directed to check blood sugars 1 time per day dx: e11.22, Disp: 1 kit, Rfl: 1   Blood Glucose Monitoring Suppl (ACCU-CHEK GUIDE) w/Device KIT, Use to check blood sugars once daily E11.9, Disp: 1 kit, Rfl: 2   Blood Glucose Monitoring Suppl (ONETOUCH VERIO) w/Device KIT, Use as directed to check blood sugars once daily E11.65, Disp: 1 kit, Rfl: 1   celecoxib (CELEBREX) 200 MG capsule, Take 1 capsule (200  mg total) by mouth 2 (two) times daily., Disp: 90 capsule, Rfl: 2   glucose blood (ACCU-CHEK AVIVA PLUS) test strip, Use as instructed to check blood sugars 1 time per day dx: e11.22, Disp: 150 each, Rfl: 3   glucose blood (ACCU-CHEK GUIDE) test strip, Use as instructed to check blood sugars once daily E11.9, Disp: 100 each, Rfl: 3   glucose blood (ONETOUCH VERIO) test strip, Use as directed to check blood sugars once daily E11.65, Disp: 100 each, Rfl: 2   glucose blood test strip, Use as instructed, Disp: 100 each, Rfl:  12   glucose blood test strip, use to check blood sugar once daily, Disp: 150 strip, Rfl: 3   insulin lispro (HUMALOG KWIKPEN) 100 UNIT/ML KwikPen, Please use with sliding scale 2-8 units before meals as needed, Disp: 15 mL, Rfl: 1   Lancets (ONETOUCH ULTRASOFT) lancets, Use as directed to check blood sugars once daily E11.65, Disp: 100 each, Rfl: 2   sitaGLIPtin (JANUVIA) 100 MG tablet, Take 100 mg by mouth daily., Disp: , Rfl:    tiZANidine (ZANAFLEX) 4 MG tablet, TAKE 1 TABLET (4 MG TOTAL) BY MOUTH DAILY AS NEEDED FOR MUSCLE SPASM, Disp: 90 tablet, Rfl: 1   venlafaxine (EFFEXOR) 37.5 MG tablet, Take 1 tablet (37.5 mg total) by mouth daily., Disp: 90 tablet, Rfl: 2   atorvastatin (LIPITOR) 40 MG tablet, TAKE 1 TABLET EVERY NIGHT, Disp: 90 tablet, Rfl: 3   losartan (COZAAR) 50 MG tablet, Take 1 tablet (50 mg total) by mouth daily., Disp: 90 tablet, Rfl: 3   metFORMIN (GLUCOPHAGE-XR) 750 MG 24 hr tablet, Take 1 tablet (750 mg total) by mouth daily after supper., Disp: 180 tablet, Rfl: 3   Allergies  Allergen Reactions   Codeine       The patient states she uses post menopausal status for birth control. No LMP recorded. Patient has had a hysterectomy.. Negative for Dysmenorrhea. Negative for: breast discharge, breast lump(s), breast pain and breast self exam. Associated symptoms include abnormal vaginal bleeding. Pertinent negatives include abnormal bleeding (hematology), anxiety, decreased libido, depression, difficulty falling sleep, dyspareunia, history of infertility, nocturia, sexual dysfunction, sleep disturbances, urinary incontinence, urinary urgency, vaginal discharge and vaginal itching. Diet regular.The patient states her exercise level is  minimal. 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   . The patient's tobacco use is:  Social History   Tobacco Use  Smoking Status Never  Smokeless Tobacco Never  . She has been exposed to passive smoke. The  patient's alcohol use is:  Social History   Substance and Sexual Activity  Alcohol Use No    Review of Systems  Constitutional: Negative.   HENT: Negative.    Eyes: Negative.  Negative for blurred vision.  Respiratory: Negative.  Negative for shortness of breath.   Cardiovascular: Negative.  Negative for chest pain and palpitations.  Gastrointestinal: Negative.   Endocrine: Negative.   Genitourinary: Negative.   Musculoskeletal: Negative.   Skin: Negative.   Allergic/Immunologic: Negative.   Neurological: Negative.   Hematological: Negative.   Psychiatric/Behavioral: Negative.       Today's Vitals   05/18/23 1405  BP: 124/80  Pulse: 80  SpO2: 98%  Weight: 144 lb 3.2 oz (65.4 kg)  Height: 5\' 1"  (1.549 m)   Body mass index is 27.25 kg/m.  Wt Readings from Last 3 Encounters:  05/18/23 144 lb 3.2 oz (65.4 kg)  02/08/23 140 lb 12.8 oz (63.9 kg)  09/09/22 141 lb  12.8 oz (64.3 kg)     Objective:  Physical Exam Vitals and nursing note reviewed.  Constitutional:      Appearance: Normal appearance.  HENT:     Head: Normocephalic and atraumatic.     Right Ear: Tympanic membrane, ear canal and external ear normal.     Left Ear: Tympanic membrane, ear canal and external ear normal.  Eyes:     Extraocular Movements: Extraocular movements intact.     Conjunctiva/sclera: Conjunctivae normal.     Pupils: Pupils are equal, round, and reactive to light.  Cardiovascular:     Rate and Rhythm: Normal rate and regular rhythm.     Pulses: Normal pulses.          Dorsalis pedis pulses are 2+ on the right side and 2+ on the left side.     Heart sounds: Normal heart sounds.  Pulmonary:     Effort: Pulmonary effort is normal.     Breath sounds: Normal breath sounds.  Chest:  Breasts:    Tanner Score is 5.     Right: Normal.     Left: Normal.  Abdominal:     General: Bowel sounds are normal.     Palpations: Abdomen is soft.  Genitourinary:    Comments:  deferred Musculoskeletal:        General: Normal range of motion.     Cervical back: Normal range of motion and neck supple.  Feet:     Right foot:     Protective Sensation: 5 sites tested.  5 sites sensed.     Skin integrity: Dry skin present.     Toenail Condition: Right toenails are long.     Left foot:     Protective Sensation: 5 sites tested.  5 sites sensed.     Skin integrity: Dry skin present.     Toenail Condition: Left toenails are long.  Skin:    General: Skin is warm and dry.     Comments: Scattered ecchymoses  Neurological:     General: No focal deficit present.     Mental Status: She is alert and oriented to person, place, and time.  Psychiatric:        Mood and Affect: Mood normal.        Behavior: Behavior normal.         Assessment And Plan:     Encounter for general adult medical examination w/o abnormal findings Assessment & Plan: A full exam was performed.  Importance of monthly self breast exams was discussed with the patient.  She is advised to get 30-45 minutes of regular exercise, no less than four to five days per week. Both weight-bearing and aerobic exercises are recommended.  She is advised to follow a healthy diet with at least six fruits/veggies per day, decrease intake of red meat and other saturated fats and to increase fish intake to twice weekly.  Meats/fish should not be fried -- baked, boiled or broiled is preferable. It is also important to cut back on your sugar intake.  Be sure to read labels - try to avoid anything with added sugar, high fructose corn syrup or other sweeteners.  If you must use a sweetener, you can try stevia or monkfruit.  It is also important to avoid artificially sweetened foods/beverages and diet drinks. Lastly, wear SPF 50 sunscreen on exposed skin and when in direct sunlight for an extended period of time.  Be sure to avoid fast food restaurants and aim for at least  60 ounces of water daily.       Dyslipidemia associated  with type 2 diabetes mellitus (HCC) Assessment & Plan: Chronic, importance of dietary/medication compliance was discussed with the patient. She is currently taking metfrmin XR 750mg  2 tabs daily and Januvia 100mg  daily. She has yet to receive patient assistance for Januvia this year; therefore, she was given samples. I also reached out to  [facility] pharmacist for assistance.  She would likely benefit from SGLT2i treatment, she doesn't wish to make med changes at this time. Will check labs as below and adjust med as needed. I DISCUSSED WITH THE PATIENT AT LENGTH REGARDING THE GOALS OF GLYCEMIC CONTROL AND POSSIBLE LONG-TERM COMPLICATIONS.  I  ALSO STRESSED THE IMPORTANCE OF COMPLIANCE WITH HOME GLUCOSE MONITORING, DIETARY RESTRICTIONS INCLUDING AVOIDANCE OF SUGARY DRINKS/PROCESSED FOODS,  ALONG WITH REGULAR EXERCISE.  I  ALSO STRESSED THE IMPORTANCE OF ANNUAL EYE EXAMS, SELF FOOT CARE AND COMPLIANCE WITH OFFICE VISITS.   Orders: -     POCT urinalysis dipstick -     Microalbumin / creatinine urine ratio -     CBC -     CMP14+EGFR -     Lipid panel -     Hemoglobin A1c  Essential hypertension, benign Assessment & Plan: Chronic, fair control.  Goal BP<120/80.  EKG performed, NSR w/o acute changes. She will continue with amlodipine 5mg  daily and losartan 50mg  daily. Encouraged to follow low sodium diet. She will f/u in four months for re-evaluation.   Orders: -     POCT urinalysis dipstick -     Microalbumin / creatinine urine ratio -     EKG 12-Lead -     CMP14+EGFR -     Lipid panel  Recurrent major depressive disorder, in partial remission (HCC) Assessment & Plan: Chronic, sx stable on Effexor. No med changes.    Senile purpura (HCC) Assessment & Plan: Chronic.  She would likely benefit from vitamin C supplementation. May also use topical Arnica gel as needed.    Other orders -     Insulin Lispro (1 Unit Dial); Please use with sliding scale 2-8 units before meals as needed  Dispense: 15  mL; Refill: 1 -     Atorvastatin Calcium; TAKE 1 TABLET EVERY NIGHT  Dispense: 90 tablet; Refill: 3 -     Losartan Potassium; Take 1 tablet (50 mg total) by mouth daily.  Dispense: 90 tablet; Refill: 3 -     metFORMIN HCl ER; Take 1 tablet (750 mg total) by mouth daily after supper.  Dispense: 180 tablet; Refill: 3     Return for 1 year physical, 3 month dm check. Patient was given opportunity to ask questions. Patient verbalized understanding of the plan and was able to repeat key elements of the plan. All questions were answered to their satisfaction.   I, Virginia Aliment, MD, have reviewed all documentation for this visit. The documentation on 05/18/23 for the exam, diagnosis, procedures, and orders are all accurate and complete.

## 2023-05-18 NOTE — Assessment & Plan Note (Signed)
Chronic.  She would likely benefit from vitamin C supplementation. May also use topical Arnica gel as needed.

## 2023-05-19 ENCOUNTER — Telehealth: Payer: Self-pay

## 2023-05-19 ENCOUNTER — Telehealth: Payer: Self-pay | Admitting: Pharmacist

## 2023-05-19 DIAGNOSIS — E1169 Type 2 diabetes mellitus with other specified complication: Secondary | ICD-10-CM

## 2023-05-19 LAB — CBC
Hematocrit: 41.4 % (ref 34.0–46.6)
Hemoglobin: 13.2 g/dL (ref 11.1–15.9)
MCH: 30.4 pg (ref 26.6–33.0)
MCHC: 31.9 g/dL (ref 31.5–35.7)
MCV: 95 fL (ref 79–97)
Platelets: 196 x10E3/uL (ref 150–450)
RBC: 4.34 x10E6/uL (ref 3.77–5.28)
RDW: 12.3 % (ref 11.7–15.4)
WBC: 9 x10E3/uL (ref 3.4–10.8)

## 2023-05-19 LAB — LIPID PANEL
Chol/HDL Ratio: 2.8 ratio (ref 0.0–4.4)
Cholesterol, Total: 170 mg/dL (ref 100–199)
HDL: 60 mg/dL
LDL Chol Calc (NIH): 94 mg/dL (ref 0–99)
Triglycerides: 84 mg/dL (ref 0–149)
VLDL Cholesterol Cal: 16 mg/dL (ref 5–40)

## 2023-05-19 LAB — MICROALBUMIN / CREATININE URINE RATIO
Creatinine, Urine: 36.9 mg/dL
Microalb/Creat Ratio: 10 mg/g{creat} (ref 0–29)
Microalbumin, Urine: 3.8 ug/mL

## 2023-05-19 LAB — CMP14+EGFR
ALT: 13 [IU]/L (ref 0–32)
AST: 16 [IU]/L (ref 0–40)
Albumin: 4.4 g/dL (ref 3.8–4.8)
Alkaline Phosphatase: 73 [IU]/L (ref 44–121)
BUN/Creatinine Ratio: 36 — ABNORMAL HIGH (ref 12–28)
BUN: 22 mg/dL (ref 8–27)
Bilirubin Total: 0.6 mg/dL (ref 0.0–1.2)
CO2: 25 mmol/L (ref 20–29)
Calcium: 9.8 mg/dL (ref 8.7–10.3)
Chloride: 99 mmol/L (ref 96–106)
Creatinine, Ser: 0.61 mg/dL (ref 0.57–1.00)
Globulin, Total: 2.3 g/dL (ref 1.5–4.5)
Glucose: 93 mg/dL (ref 70–99)
Potassium: 4.5 mmol/L (ref 3.5–5.2)
Sodium: 140 mmol/L (ref 134–144)
Total Protein: 6.7 g/dL (ref 6.0–8.5)
eGFR: 91 mL/min/{1.73_m2} (ref >59)

## 2023-05-19 LAB — HEMOGLOBIN A1C
Est. average glucose Bld gHb Est-mCnc: 189 mg/dL
Hgb A1c MFr Bld: 8.2 % — ABNORMAL HIGH (ref 4.8–5.6)

## 2023-05-19 NOTE — Telephone Encounter (Signed)
PAP: Patient assistance application for Januvia and Humalog through [employer] has been mailed to [employer] address on file. Provider portion of application will be faxed to provider's office.   Please note I have faxed the provider portion of the applications to Dr. Dorothyann Peng for review

## 2023-05-19 NOTE — Progress Notes (Signed)
05/19/2023 Name: Virginia Russell [facility] MRN: [id number] DOB: [birth year]  Chief Complaint  Patient presents with   Medication Management    Diabetes     Virginia Russell [facility] is a 78 y.o. year old female who presented for a telephone visit.   They were referred to the pharmacist by their PCP for assistance in managing medication access.    Subjective:  Care Team: Primary Care Provider: Dorothyann Peng, MD ; Next Scheduled Visit: 08/22/2023   Medication Access/Adherence  Current Pharmacy:  [facility] [address], [place] - [address] [place] [address] Phone: [phone] Fax: [phone]  [facility] - [place], [place] - [address] [place] [address] Phone: [phone] Fax: [phone]   Patient reports affordability concerns with their medications: Yes  Januvia and Humalog Patient reports access/transportation concerns to their pharmacy: No  Patient reports adherence concerns with their medications:  No      Diabetes:  Current medications:  Metformin XR 750 mg 1 tablet Daily Januvia 100 mg 1 tablet daily Humalog 2-8 units per sliding scale before meals (just started yesterday to be given when she gets steroids injections) Patient denies hypoglycemic or hyperglycemic s/sx. She also reported Humalog had a cost of $105 due to it being written for a 62 days supply.  To get it billed at 30 days, the script would have to include a note that the dose would be titrated to 50 units daily--(Provider notified)  Hypertension:  Current medications:   Amlodipine 5 mg 1 tablet daily Losartan 50 mg 1 tablet daily  Hyperlipidemia/ASCVD Risk Reduction Atorvastatin 40 mg   Antiplatelet regimen:  81 mg 1 tablet daily    Clinical ASCVD:  The 10-year ASCVD risk score (Arnett DK, et al., 2019) is: 45.9%   Values used to calculate the score:     Age: 81 years     Sex: Female     Is  Non-Hispanic African American: No     Diabetic: Yes     Tobacco smoker: No     Systolic Blood Pressure: 124 mmHg     Is BP treated: Yes     HDL Cholesterol: 60 mg/dL     Total Cholesterol: 170 mg/dL    Objective:  Lab Results  Component Value Date   HGBA1C 8.2 (H) 05/18/2023    Lab Results  Component Value Date   CREATININE 0.61 05/18/2023   BUN 22 05/18/2023   NA 140 05/18/2023   K 4.5 05/18/2023   CL 99 05/18/2023   CO2 25 05/18/2023    Lab Results  Component Value Date   CHOL 170 05/18/2023   HDL 60 05/18/2023   LDLCALC 94 05/18/2023   TRIG 84 05/18/2023   CHOLHDL 2.8 05/18/2023    Medications Reviewed Today     Reviewed by Beecher Mcardle, RPH (Pharmacist) on 05/19/23 at 1344  Med List Status: <None>   Medication Order Taking? Sig Documenting Provider Last Dose Status Informant  Accu-Chek Softclix Lancets lancets [id number] Yes Use as instructed to check blood sugars 1 time per day dx: e11.9 Virginia Peng, MD Taking Active   Alcohol Swabs (B-D SINGLE USE SWABS BUTTERFLY) PADS [id number] Yes 100 Pieces by Does not apply route as needed. Virginia Peng, MD Taking Active   amLODipine (NORVASC) 5 MG tablet [id number] Yes TAKE 1 TABLET BY MOUTH EVERY Josephine Igo, MD Taking Active   aspirin 81 MG tablet [id number] Yes Take 81 mg by  mouth daily. [provider] Taking Active   atorvastatin (LIPITOR) 40 MG tablet [id number] Yes TAKE 1 TABLET EVERY NIGHT Virginia Peng, MD Taking Active   Blood Glucose Monitoring Suppl (ACCU-CHEK GUIDE) w/Device KIT [id number] Yes Use to check blood sugars once daily E11.9 Virginia Peng, MD Taking Active   celecoxib (CELEBREX) 100 MG capsule [id number] Yes Take 100 mg by mouth 2 (two) times daily. [provider] Taking Active   glucose blood (ACCU-CHEK AVIVA PLUS) test strip [id number] Yes Use as instructed to check blood sugars 1 time per day dx: e11.22 Virginia Peng, MD Taking Active   glucose blood (ACCU-CHEK  GUIDE) test strip [id number] Yes Use as instructed to check blood sugars once daily E11.9 Virginia Peng, MD Taking Active   insulin lispro (HUMALOG KWIKPEN) 100 UNIT/ML KwikPen [id number] Yes Please use with sliding scale 2-8 units before meals as needed Virginia Peng, MD Taking Active   losartan (COZAAR) 50 MG tablet [id number] Yes Take 1 tablet (50 mg total) by mouth daily. Virginia Peng, MD Taking Active   metFORMIN (GLUCOPHAGE-XR) 750 MG 24 hr tablet [id number] Yes Take 1 tablet (750 mg total) by mouth daily after supper.  Patient taking differently: Take 1,500 mg by mouth daily after supper.   Virginia Peng, MD Taking Active   sitaGLIPtin [facility]) 100 MG tablet [id number] Yes Take 100 mg by mouth daily. [provider] Taking Active   tiZANidine (ZANAFLEX) 4 MG tablet [id number] Yes TAKE 1 TABLET (4 MG TOTAL) BY MOUTH DAILY AS NEEDED FOR MUSCLE SPASM Virginia Peng, MD Taking Active   venlafaxine [facility]) 37.5 MG tablet [id number] Yes Take 1 tablet (37.5 mg total) by mouth daily. Virginia Peng, MD Taking Active               Assessment/Plan:   Diabetes: - Currently uncontrolled - Reviewed long term cardiovascular and renal outcomes of uncontrolled blood sugar - Meets financial criteria for Januvia and Humalog patient assistance program through [employer] and [employer]. Will collaborate with provider, CPhT, and patient to pursue assistance.      Hypertension: - Currently controlled 124/80 -Continue Current Therapy   Hyperlipidemia/ASCVD Risk Reduction: - Currently controlled. HDL 60 LDL 94 TG 84 - Reviewed long term complications of uncontrolled cholesterol - Continue Current Therapy  Follow Up Plan:    Route note to Med Assistance Team. Follow up to see how Med Assistance Process is going.  Beecher Mcardle, PharmD, [facility] Clinical Pharmacist [facility] [phone]

## 2023-05-21 ENCOUNTER — Other Ambulatory Visit: Payer: Self-pay | Admitting: Internal Medicine

## 2023-05-21 MED ORDER — METFORMIN HCL ER 750 MG PO TB24: 1500.0000 mg | ORAL_TABLET | Freq: Every day | ORAL | 2 refills | Status: AC

## 2023-05-23 NOTE — Telephone Encounter (Addendum)
Received provider portion of [employer] and [employer] PAP application

## 2023-05-30 ENCOUNTER — Telehealth: Payer: Self-pay

## 2023-05-30 NOTE — Telephone Encounter (Signed)
 Patient called requesting a referral for cortizone shots in her knees. Patient was recently seen on 2/15 and did not mention anything about her knees. She will either need to come in for an appointment here and get a referral place or she can go as a walk in to [facility]. Left pt vm to call the office [facility]

## 2023-06-01 NOTE — Telephone Encounter (Signed)
 Sent patient mychart message to make sure she received her patient assistance applications.

## 2023-06-02 NOTE — Telephone Encounter (Signed)
 Left HIPAA compliant voice mail for patient regarding patient assistance application mailed on 2/13

## 2023-06-03 NOTE — Telephone Encounter (Signed)
 Unable to reach patient after multiple attempts.  Patient never returned her application for patient assistance

## 2023-06-06 ENCOUNTER — Other Ambulatory Visit: Payer: Self-pay | Admitting: Internal Medicine

## 2023-06-06 DIAGNOSIS — E1169 Type 2 diabetes mellitus with other specified complication: Secondary | ICD-10-CM

## 2023-06-06 MED ORDER — SITAGLIPTIN PHOSPHATE 100 MG PO TABS: 100.0000 mg | ORAL_TABLET | Freq: Every day | ORAL | 1 refills | Status: AC

## 2023-06-06 NOTE — Telephone Encounter (Signed)
 Copied from CRM [id number]. Topic: Clinical - Medication Refill >> Jun 06, 2023  2:50 PM Antwanette L wrote: Most Recent Primary Care Visit:  Provider: Dorothyann Peng  Department: [facility]  Visit Type: PHYSICAL  Date: 05/18/2023  Medication: sitaGLIPtin (JANUVIA) 100 MG tablet  Has the patient contacted their pharmacy? No   Is this the correct pharmacy for this prescription? Yes If no, delete pharmacy and type the correct one.  This is the patient's preferred pharmacy:  [facility] - [place], [place] - [address] [place] [address] Phone: [phone] Fax: [phone]   Has the prescription been filled recently? No  Is the patient out of the medication? No. Patient only has a week left of medication. Patient wants to know if Dr. Allyne Gee will prescribe a 3 month supply?  Has the patient been seen for an appointment in the last year OR does the patient have an upcoming appointment? Yes  Can we respond through MyChart? No. Please contact patient by phone [phone] or [phone].  Agent: Please be advised that Rx refills may take up to 3 business days. We ask that you follow-up with your pharmacy.

## 2023-06-07 ENCOUNTER — Ambulatory Visit: Payer: Self-pay | Admitting: Internal Medicine

## 2023-06-07 NOTE — Telephone Encounter (Signed)
 Copied from CRM [id number]. Topic: Clinical - Lab/Test Results >> Jun 07, 2023 11:58 AM Ivette P wrote: Reason for CRM: Pt called regarding missed call about labs. Pt would ike to get more questions answered.   Patient calling for lab results. Patient wanted to know if her cholesterol was in normal range. I advised the patient it was. She had no further questions at this time.    Reason for Disposition  [1] Other NON-URGENT information for PCP AND [2] does not require PCP response  Answer Assessment - Initial Assessment Questions 1. REASON FOR CALL or QUESTION: "What is your reason for calling today?" or "How can I best help you?" or "What question do you have that I can help answer?"     Wanted to know if cholesterol was in normal range  2. CALLER: Document the source of call. (e.g., laboratory, patient).     Patient  Protocols used: PCP Call - No Triage-A-AH

## 2023-06-08 ENCOUNTER — Telehealth: Payer: Self-pay

## 2023-06-08 NOTE — Telephone Encounter (Signed)
 PAP: Application for Januvia has been submitted to [employer], via fax PAP: Application for Humalog has been submitted to [employer], via fax

## 2023-06-09 NOTE — Telephone Encounter (Signed)
 PAP: Patient assistance application for Humalog has been approved by PAP Companies: [employer] from 06/09/2023 to 04/04/2024. Medication should be delivered to PAP Delivery: Provider's office. For further shipping updates, please contact [employer] at [phone]. Patient ID is: not provided Letter of Approval in Media

## 2023-06-13 DIAGNOSIS — M170 Bilateral primary osteoarthritis of knee: Secondary | ICD-10-CM | POA: Diagnosis not present

## 2023-06-14 NOTE — Telephone Encounter (Signed)
 Reached out to [employer] to check status on PAP and a attestation letter has been mailed to patient's home address 06/09/23 and also had to re-fax Prov. Portion missing MD signature. Will update upon approval.

## 2023-06-21 ENCOUNTER — Telehealth: Payer: Self-pay | Admitting: Pharmacist

## 2023-06-21 ENCOUNTER — Encounter: Payer: Self-pay | Admitting: Pharmacist

## 2023-06-21 DIAGNOSIS — E1169 Type 2 diabetes mellitus with other specified complication: Secondary | ICD-10-CM

## 2023-06-21 NOTE — Progress Notes (Signed)
 error

## 2023-06-21 NOTE — Progress Notes (Signed)
   06/21/2023  Patient ID: Virginia Russell [facility], female   DOB: 1945/12/06, 78 y.o.   MRN: [id number]  Called Patient regarding medication assistance. Unfortunately, she did not answer the phone. HIPAA compliant message was left on her voicemail. Need to determine if the Patient received the attestation form from [employer] and returned it as requested.  Plan:  Call patient back in 1 week.   Beecher Mcardle, PharmD, BCACP Clinical Pharmacist [phone]

## 2023-06-21 NOTE — Telephone Encounter (Signed)
 error

## 2023-06-23 NOTE — Telephone Encounter (Signed)
 Reached out to Patient and she has sent attestation letter back to [employer]- waiting on MD sign. on application to submit to Co. Refaxed to office today.

## 2023-06-29 NOTE — Telephone Encounter (Signed)
 PAP: Application for Januvia has been submitted to [employer], via fax with corrected Provider page and attestation letter was mailed to [employer] by Patient.

## 2023-07-05 DIAGNOSIS — R300 Dysuria: Secondary | ICD-10-CM | POA: Diagnosis not present

## 2023-07-05 DIAGNOSIS — N390 Urinary tract infection, site not specified: Secondary | ICD-10-CM | POA: Diagnosis not present

## 2023-07-07 NOTE — Telephone Encounter (Signed)
 PAP: Patient assistance application for Januvia has been approved by PAP Companies: [employer] from 07/05/2023 to 04/04/2024. Medication should be delivered to PAP Delivery: Home. For further shipping updates, please contact [employer] at [phone]. Patient ID is: not provided  Shipment processed should arrive 07/08/2023 90 DS

## 2023-07-13 ENCOUNTER — Telehealth: Payer: Self-pay | Admitting: Pharmacist

## 2023-07-13 DIAGNOSIS — E1169 Type 2 diabetes mellitus with other specified complication: Secondary | ICD-10-CM

## 2023-07-13 NOTE — Progress Notes (Signed)
   07/13/2023  Patient ID: Virginia Russell [facility], female   DOB: September 27, 1945, 78 y.o.   MRN: [id number]  Called Patient to follow up on medication assistance. HIPAA identifiers were obtained. She confirmed receipt of both Januvia and Humalog from [employer] and  [employer].  She was not at home at the time of my call but said her blood sugars have decreased and have been in then 120-150 range for most of the day.  She has an upcoming appointment with her Provider on 08/22/2023 and can have a new A1c drawn. A1c from February 8.2%  Plan: Follow up after her upcoming PCP appointment.   Beecher Mcardle, PharmD, BCACP Clinical Pharmacist [phone]

## 2023-07-26 ENCOUNTER — Other Ambulatory Visit: Payer: Self-pay | Admitting: Internal Medicine

## 2023-07-27 DIAGNOSIS — M1711 Unilateral primary osteoarthritis, right knee: Secondary | ICD-10-CM | POA: Diagnosis not present

## 2023-08-15 ENCOUNTER — Other Ambulatory Visit: Payer: Self-pay | Admitting: Internal Medicine

## 2023-08-15 DIAGNOSIS — E1169 Type 2 diabetes mellitus with other specified complication: Secondary | ICD-10-CM

## 2023-08-15 DIAGNOSIS — Z01818 Encounter for other preprocedural examination: Secondary | ICD-10-CM

## 2023-08-15 DIAGNOSIS — I10 Essential (primary) hypertension: Secondary | ICD-10-CM

## 2023-08-22 ENCOUNTER — Encounter: Payer: Self-pay | Admitting: Internal Medicine

## 2023-08-22 ENCOUNTER — Ambulatory Visit (INDEPENDENT_AMBULATORY_CARE_PROVIDER_SITE_OTHER): Admitting: Internal Medicine

## 2023-08-22 ENCOUNTER — Ambulatory Visit: Payer: Medicare HMO | Admitting: Internal Medicine

## 2023-08-22 VITALS — BP 114/72 | HR 75 | Temp 97.6°F | Ht 61.0 in | Wt 139.6 lb

## 2023-08-22 DIAGNOSIS — Z01818 Encounter for other preprocedural examination: Secondary | ICD-10-CM | POA: Diagnosis not present

## 2023-08-22 DIAGNOSIS — I119 Hypertensive heart disease without heart failure: Secondary | ICD-10-CM | POA: Diagnosis not present

## 2023-08-22 DIAGNOSIS — E785 Hyperlipidemia, unspecified: Secondary | ICD-10-CM

## 2023-08-22 DIAGNOSIS — M1711 Unilateral primary osteoarthritis, right knee: Secondary | ICD-10-CM | POA: Insufficient documentation

## 2023-08-22 DIAGNOSIS — E1169 Type 2 diabetes mellitus with other specified complication: Secondary | ICD-10-CM | POA: Diagnosis not present

## 2023-08-22 DIAGNOSIS — I350 Nonrheumatic aortic (valve) stenosis: Secondary | ICD-10-CM

## 2023-08-22 NOTE — Assessment & Plan Note (Signed)
 Chronic, now followed by Dr. Abigail Abler. Awaiting clearance for right TKR.

## 2023-08-22 NOTE — Assessment & Plan Note (Signed)
 Chronic, LDL goal is less than 70. She was congratulated on recent lifestyle changes. She will continue with Januvia  and metformin . Will consider addition of SGLT2i for cardiac/renal protection. Plan to initiate therapy post-operatively.

## 2023-08-22 NOTE — Assessment & Plan Note (Addendum)
 Chronic, well controlled. No med changes.  -continue with amlodipine  5mg  daily and losartan  50mg  daily.  -Encouraged to follow low sodium diet.

## 2023-08-22 NOTE — Assessment & Plan Note (Signed)
 Calcification noted on 2021 echocardiogram.- Order echocardiogram. - Coordinate with cardiologist for clearance.

## 2023-08-22 NOTE — Progress Notes (Addendum)
 I,Virginia Russell, CMA,acting as a Neurosurgeon for Smiley Dung, MD.,have documented all relevant documentation on the behalf of Smiley Dung, MD,as directed by  Smiley Dung, MD while in the presence of Smiley Dung, MD.  Subjective:  Patient ID: Virginia Russell [facility] , female    DOB: 20-Mar-1946 , 78 y.o.   MRN: [id number]  Chief Complaint  Patient presents with   Diabetes    Pt presents for bp & dm check. Pt doesn't have any specific questions or concerns. Denies headaches, chest pain and sob     HPI Discussed the use of AI scribe software for clinical note transcription with the patient, who gave verbal consent to proceed.  History of Present Illness Virginia Russell is a 78 year old female who presents for diabetes, blood pressure check and pre-operative evaluation for right knee replacement surgery.  She is focused on improving her A1c levels to meet surgical requirements. She has been actively modifying her diet, eliminating sodas, and primarily drinking water. She reports significant dietary restrictions, stating 'I'm not eating hardly anything' and consuming protein shakes twice a week to maintain nutrition. She has lost five pounds as a result of these efforts.  She has a history of a heart murmur and was informed that she needs to see a cardiologist before her surgery. Her last cardiology evaluation was in 2021, where she underwent a stress test due to being 'short winded'. No current chest pain or shortness of breath.   Her cholesterol levels were previously checked, with her LDL being 94. She has not had any medication changes for cholesterol since her last visit.  She continues to work cleaning houses, although she notes that she is 'crawling around some of them' due to her knee condition. She is concerned about coverage for her work during her recovery period post-surgery.   Diabetes She presents for her follow-up diabetic visit. She has type 2 diabetes  mellitus. No MedicAlert identification noted. The initial diagnosis of diabetes was made 1 year ago. Her disease course has been stable. There are no hypoglycemic associated symptoms. Pertinent negatives for diabetes include no blurred vision, no polydipsia, no polyphagia and no polyuria. There are no hypoglycemic complications. Symptoms are stable. Risk factors for coronary artery disease include dyslipidemia, diabetes mellitus, hypertension, post-menopausal and stress. She is compliant with treatment all of the time. She is following a diabetic diet. She participates in exercise intermittently. Her breakfast blood glucose is taken between 7-8 am. Her breakfast blood glucose range is generally 90-110 mg/dl. An ACE inhibitor/angiotensin II receptor blocker is being taken. Eye exam is current.  Hypertension This is a chronic problem. The current episode started more than 1 year ago. The problem has been gradually improving since onset. The problem is controlled. Pertinent negatives include no blurred vision or palpitations. Past treatments include ACE inhibitors and diuretics. The current treatment provides moderate improvement.     Past Medical History:  Diagnosis Date   Anxiety    Aortic stenosis, mild 02/11/2020   Mean gradient 9 mmHg on 11/2019   Carotid stenosis, bilateral 02/11/2020   Diabetes mellitus without complication (HCC)    High cholesterol    HTN (hypertension)    Hyperlipidemia LDL goal <70 02/11/2020     Family History  Problem Relation Age of Onset   Breast cancer Mother    Heart disease Mother    Heart attack Mother    Stroke Father    Heart attack Father  Heart attack Daughter    Heart attack Brother    Heart attack Maternal Grandmother    Heart attack Paternal Grandmother      Current Outpatient Medications:    Accu-Chek Softclix Lancets lancets, Use as instructed to check blood sugars 1 time per day dx: e11.9, Disp: 150 each, Rfl: 3   Alcohol Swabs  (B-D SINGLE USE  SWABS  BUTTERFLY) PADS, 100 Pieces by Does not apply route as needed., Disp: 100 each, Rfl: 3   amLODipine  (NORVASC ) 5 MG tablet, TAKE 1 TABLET BY MOUTH EVERY EVENING, Disp: 90 tablet, Rfl: 3   aspirin 81 MG tablet, Take 81 mg by mouth daily., Disp: , Rfl:    atorvastatin  (LIPITOR) 40 MG tablet, TAKE 1 TABLET EVERY NIGHT, Disp: 90 tablet, Rfl: 3   Blood Glucose Monitoring Suppl (ACCU-CHEK GUIDE) w/Device KIT, Use to check blood sugars once daily E11.9, Disp: 1 kit, Rfl: 2   celecoxib  (CELEBREX ) 100 MG capsule, Take 100 mg by mouth 2 (two) times daily., Disp: , Rfl:    cycloSPORINE (RESTASIS) 0.05 % ophthalmic emulsion, Place into both eyes 2 (two) times daily., Disp: , Rfl:    glucose blood (ACCU-CHEK AVIVA PLUS) test strip, Use as instructed to check blood sugars 1 time per day dx: e11.22, Disp: 150 each, Rfl: 3   glucose blood (ACCU-CHEK GUIDE) test strip, Use as instructed to check blood sugars once daily E11.9, Disp: 100 each, Rfl: 3   losartan  (COZAAR ) 50 MG tablet, Take 1 tablet (50 mg total) by mouth daily., Disp: 90 tablet, Rfl: 3   metFORMIN  (GLUCOPHAGE -XR) 750 MG 24 hr tablet, Take 2 tablets (1,500 mg total) by mouth daily after supper., Disp: 180 tablet, Rfl: 2   nitrofurantoin (MACRODANTIN) 100 MG capsule, Take 100 mg by mouth 2 (two) times daily., Disp: , Rfl:    sitaGLIPtin  (JANUVIA ) 100 MG tablet, Take 1 tablet (100 mg total) by mouth daily., Disp: 90 tablet, Rfl: 1   tiZANidine  (ZANAFLEX ) 4 MG tablet, TAKE 1 TABLET (4 MG TOTAL) BY MOUTH DAILY AS NEEDED FOR MUSCLE SPASM, Disp: 90 tablet, Rfl: 1   venlafaxine  (EFFEXOR ) 37.5 MG tablet, TAKE 1 TABLET BY MOUTH EVERY DAY, Disp: 90 tablet, Rfl: 2   insulin  lispro (HUMALOG  KWIKPEN) 100 UNIT/ML KwikPen, Please use with sliding scale 2-8 units before meals as needed (Patient not taking: Reported on 08/22/2023), Disp: 15 mL, Rfl: 1   Allergies  Allergen Reactions   Codeine      Review of Systems  Constitutional: Negative.   Eyes:  Negative  for blurred vision.  Respiratory: Negative.    Cardiovascular: Negative.  Negative for palpitations.  Gastrointestinal: Negative.   Endocrine: Negative for polydipsia, polyphagia and polyuria.  Neurological: Negative.   Psychiatric/Behavioral: Negative.       Today's Vitals   08/22/23 1130  BP: 114/72  Pulse: 75  Temp: 97.6 F (36.4 C)  SpO2: 98%  Weight: 139 lb 9.6 oz (63.3 kg)  Height: 5\' 1"  (1.549 m)   Body mass index is 26.38 kg/m.  Wt Readings from Last 3 Encounters:  08/22/23 139 lb 9.6 oz (63.3 kg)  05/18/23 144 lb 3.2 oz (65.4 kg)  02/08/23 140 lb 12.8 oz (63.9 kg)     Objective:  Physical Exam Vitals and nursing note reviewed.  Constitutional:      Appearance: Normal appearance.  HENT:     Head: Normocephalic and atraumatic.  Eyes:     Extraocular Movements: Extraocular movements intact.  Cardiovascular:     Rate  and Rhythm: Normal rate and regular rhythm.     Heart sounds: Murmur heard.  Pulmonary:     Effort: Pulmonary effort is normal.     Breath sounds: Normal breath sounds.  Musculoskeletal:     Cervical back: Normal range of motion.  Skin:    General: Skin is warm.  Neurological:     General: No focal deficit present.     Mental Status: She is alert.  Psychiatric:        Mood and Affect: Mood normal.        Behavior: Behavior normal.         Assessment And Plan:  Hypertensive heart disease without heart failure Assessment & Plan: Chronic, well controlled. No med changes.  -continue with amlodipine  5mg  daily and losartan  50mg  daily.  -Encouraged to follow low sodium diet.   Orders: -     BMP8+eGFR  Dyslipidemia associated with type 2 diabetes mellitus (HCC) Assessment & Plan: Chronic, LDL goal is less than 70. She was congratulated on recent lifestyle changes. She will continue with Januvia  and metformin . Will consider addition of SGLT2i for cardiac/renal protection. Plan to initiate therapy post-operatively.   Orders: -      Hemoglobin A1c -     BMP8+eGFR  Primary osteoarthritis of right knee Assessment & Plan: Chronic, now followed by Dr. Abigail Abler. Awaiting clearance for right TKR.    Aortic stenosis, mild Assessment & Plan: Calcification noted on 2021 echocardiogram.- Order echocardiogram. - Coordinate with cardiologist for clearance.  Orders: -     ECHOCARDIOGRAM COMPLETE; Future  Pre-operative clearance Assessment & Plan: EKG January 2025 was normal.  Will also submit stress test from 2021. Will schedule her for echo to f/u mild aortic stenosis. Clearance is also pending A1c results.     Return in 4 months (on 12/23/2023), or dm check.  Patient was given opportunity to ask questions. Patient verbalized understanding of the plan and was able to repeat key elements of the plan. All questions were answered to their satisfaction.   I, Smiley Dung, MD, have reviewed all documentation for this visit. The documentation on 08/22/23 for the exam, diagnosis, procedures, and orders are all accurate and complete.   IF YOU HAVE BEEN REFERRED TO A SPECIALIST, IT MAY TAKE 1-2 WEEKS TO SCHEDULE/PROCESS THE REFERRAL. IF YOU HAVE NOT HEARD FROM US /SPECIALIST IN TWO WEEKS, PLEASE GIVE US  A CALL AT [phone].   THE PATIENT IS ENCOURAGED TO PRACTICE SOCIAL DISTANCING DUE TO THE COVID-19 PANDEMIC.

## 2023-08-22 NOTE — Patient Instructions (Signed)
 Hypertension, Adult Hypertension is another name for high blood pressure. High blood pressure forces your heart to work harder to pump blood. This can cause problems over time. There are two numbers in a blood pressure reading. There is a top number (systolic) over a bottom number (diastolic). It is best to have a blood pressure that is below 120/80. What are the causes? The cause of this condition is not known. Some other conditions can lead to high blood pressure. What increases the risk? Some lifestyle factors can make you more likely to develop high blood pressure: Smoking. Not getting enough exercise or physical activity. Being overweight. Having too much fat, sugar, calories, or salt (sodium) in your diet. Drinking too much alcohol. Other risk factors include: Having any of these conditions: Heart disease. Diabetes. High cholesterol. Kidney disease. Obstructive sleep apnea. Having a family history of high blood pressure and high cholesterol. Age. The risk increases with age. Stress. What are the signs or symptoms? High blood pressure may not cause symptoms. Very high blood pressure (hypertensive crisis) may cause: Headache. Fast or uneven heartbeats (palpitations). Shortness of breath. Nosebleed. Vomiting or feeling like you may vomit (nauseous). Changes in how you see. Very bad chest pain. Feeling dizzy. Seizures. How is this treated? This condition is treated by making healthy lifestyle changes, such as: Eating healthy foods. Exercising more. Drinking less alcohol. Your doctor may prescribe medicine if lifestyle changes do not help enough and if: Your top number is above 130. Your bottom number is above 80. Your personal target blood pressure may vary. Follow these instructions at home: Eating and drinking  If told, follow the DASH eating plan. To follow this plan: Fill one half of your plate at each meal with fruits and vegetables. Fill one fourth of your plate  at each meal with whole grains. Whole grains include whole-wheat pasta, brown rice, and whole-grain bread. Eat or drink low-fat dairy products, such as skim milk or low-fat yogurt. Fill one fourth of your plate at each meal with low-fat (lean) proteins. Low-fat proteins include fish, chicken without skin, eggs, beans, and tofu. Avoid fatty meat, cured and processed meat, or chicken with skin. Avoid pre-made or processed food. Limit the amount of salt in your diet to less than 1,500 mg each day. Do not drink alcohol if: Your doctor tells you not to drink. You are pregnant, may be pregnant, or are planning to become pregnant. If you drink alcohol: Limit how much you have to: 0-1 drink a day for women. 0-2 drinks a day for men. Know how much alcohol is in your drink. In the [place] one drink equals one 12 oz bottle of beer (355 mL), one 5 oz glass of wine (148 mL), or one 1 oz glass of hard liquor (44 mL). Lifestyle  Work with your doctor to stay at a healthy weight or to lose weight. Ask your doctor what the best weight is for you. Get at least 30 minutes of exercise that causes your heart to beat faster (aerobic exercise) most days of the week. This may include walking, swimming, or biking. Get at least 30 minutes of exercise that strengthens your muscles (resistance exercise) at least 3 days a week. This may include lifting weights or doing Pilates. Do not smoke or use any products that contain nicotine or tobacco. If you need help quitting, ask your doctor. Check your blood pressure at home as told by your doctor. Keep all follow-up visits. Medicines Take over-the-counter and prescription medicines  only as told by your doctor. Follow directions carefully. Do not skip doses of blood pressure medicine. The medicine does not work as well if you skip doses. Skipping doses also puts you at risk for problems. Ask your doctor about side effects or reactions to medicines that you should watch  for. Contact a doctor if: You think you are having a reaction to the medicine you are taking. You have headaches that keep coming back. You feel dizzy. You have swelling in your ankles. You have trouble with your vision. Get help right away if: You get a very bad headache. You start to feel mixed up (confused). You feel weak or numb. You feel faint. You have very bad pain in your: Chest. Belly (abdomen). You vomit more than once. You have trouble breathing. These symptoms may be an emergency. Get help right away. Call 911. Do not wait to see if the symptoms will go away. Do not drive yourself to the hospital. Summary Hypertension is another name for high blood pressure. High blood pressure forces your heart to work harder to pump blood. For most people, a normal blood pressure is less than 120/80. Making healthy choices can help lower blood pressure. If your blood pressure does not get lower with healthy choices, you may need to take medicine. This information is not intended to replace advice given to you by your health care provider. Make sure you discuss any questions you have with your health care provider. Document Revised: 01/08/2021 Document Reviewed: 01/08/2021 Elsevier Patient Education  2024 [employer].

## 2023-08-22 NOTE — Assessment & Plan Note (Signed)
 EKG January 2025 was normal.  Will also submit stress test from 2021. Will schedule her for echo to f/u mild aortic stenosis. Clearance is also pending A1c results.

## 2023-08-23 ENCOUNTER — Ambulatory Visit: Payer: Self-pay | Admitting: Internal Medicine

## 2023-08-23 ENCOUNTER — Ambulatory Visit (HOSPITAL_COMMUNITY)
Admission: RE | Admit: 2023-08-23 | Discharge: 2023-08-23 | Disposition: A | Discharge status: Home / Self Care | Source: Ambulatory Visit | Attending: Internal Medicine | Admitting: Internal Medicine

## 2023-08-23 DIAGNOSIS — I350 Nonrheumatic aortic (valve) stenosis: Secondary | ICD-10-CM | POA: Insufficient documentation

## 2023-08-23 DIAGNOSIS — I340 Nonrheumatic mitral (valve) insufficiency: Secondary | ICD-10-CM

## 2023-08-23 LAB — ECHOCARDIOGRAM COMPLETE
AR max vel: 1.44 cm2
AV Area VTI: 1.54 cm2
AV Area mean vel: 1.35 cm2
AV Mean grad: 13.6 mmHg
AV Peak grad: 23.3 mmHg
Ao pk vel: 2.41 m/s
Area-P 1/2: 3.86 cm2
MV M vel: 5.58 m/s
MV Peak grad: 124.3 mmHg
MV VTI: 1.9 cm2
P 1/2 time: 308 ms
Radius: 0.8 cm
S' Lateral: 2.9 cm

## 2023-08-23 LAB — BMP8+EGFR
BUN/Creatinine Ratio: 17 (ref 12–28)
BUN: 9 mg/dL (ref 8–27)
CO2: 24 mmol/L (ref 20–29)
Calcium: 9.4 mg/dL (ref 8.7–10.3)
Chloride: 104 mmol/L (ref 96–106)
Creatinine, Ser: 0.53 mg/dL — ABNORMAL LOW (ref 0.57–1.00)
Glucose: 117 mg/dL — ABNORMAL HIGH (ref 70–99)
Potassium: 4.6 mmol/L (ref 3.5–5.2)
Sodium: 144 mmol/L (ref 134–144)
eGFR: 95 mL/min/{1.73_m2} (ref >59)

## 2023-08-23 LAB — HEMOGLOBIN A1C
Est. average glucose Bld gHb Est-mCnc: 171 mg/dL
Hgb A1c MFr Bld: 7.6 % — ABNORMAL HIGH (ref 4.8–5.6)

## 2023-08-23 NOTE — Addendum Note (Signed)
 Encounter addended by: Ewing Holiday D on: 08/23/2023 12:42 PM  Actions taken: Imaging Exam ended, Charge Capture section accepted

## 2023-08-30 ENCOUNTER — Encounter: Payer: Self-pay | Admitting: Cardiology

## 2023-08-30 ENCOUNTER — Telehealth: Payer: Self-pay | Admitting: Pharmacist

## 2023-08-30 DIAGNOSIS — E1169 Type 2 diabetes mellitus with other specified complication: Secondary | ICD-10-CM

## 2023-08-30 NOTE — Progress Notes (Addendum)
   08/30/2023  Patient ID: Virginia Russell [facility], female   DOB: 10/23/1945, 78 y.o.   MRN: [id number]   Patient was called to follow up on blood sugars. Unfortunately, she did not answer her phone. HIPAA compliant message was left on her voicemail, We assisted her with medication assistance for Januvia  and Humalog .  Her HgA1c decreased from 8.2% to 7.6%  Her next appointment with the PCP is 12/26/03.    Virginia Russell is available through [employer]. Annual adjusted gross income must be at or below 300% of the Federal Poverty Level (FPL) for a patient to qualify.  A1c-6.1%  LDL from 02/25-94  On statin therapy-Atorvastatin   last filled 08/01/2023 for a 90 day supply  Plan:  Will follow up with the Patient after her upcoming appointment to ger her re-enrolled for 2026.   Virginia Russell, PharmD, BCACP Clinical Pharmacist [phone]   ADDENDUM Patient called me back. HIPAA identifiers were obtained.    Spoke to Patient about her blood sugars, She reported that she is no longer using Humalog .  She said she checks her blood sugars every morning and they are usually in the 120s.  She did express some concern about the echocardiogram she had last week that said she had some aortic calcification.  She has an appointment to see Dr. Virgil Griffiths with Cardiology 09/01/23 for evaluation.  Patient is scheduled to have knee replacement surgery in August.  She is on statin therapy with a LDL of 94 mg/dl. She may need additional therapy like ezetimibe or Repatha.  Patietn was instructed to call me back with any medication related questions or concerns after her cardiology visit if she needs to.  Plan: Follow up with Patient in October for renewal of medication assistance.  Virginia Russell, PharmD, BCACP Clinical Pharmacist [phone]

## 2023-08-30 NOTE — H&P (View-Only) (Signed)
 Cardiology Office Note   Date:  09/01/2023   ID:  [name], [place] 02/03/1946, MRN [id number]  PCP:  Cleave Curling, MD  Cardiologist:   Eilleen Grates, MD Referring:  Cleave Curling, MD  Chief Complaint  Patient presents with   Mitral Regurgitation      History of Present Illness: Virginia Russell is a 78 y.o. female who presents for with a hx of DM, HTN, HLD who presents for follow-up of chest pain. She was evaluated by Dr. Theodis Fiscal for chest pain in 2021. She had a normal stress test.  She had mild AS on echo in 2021.     She needs to have knee replacement.  Prior to this she had an echocardiogram ordered by her primary provider to follow-up murmur.  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  She denies any shortness of breath, PND or orthopnea.  She has had no palpitations, presyncope or syncope.  She has had no weight gain or edema.  She moves much slower because of the knee.  Her echo demonstrated the EF to be 65 to 70%.  Left atrial size was severely dilated.  Right atrial size was moderately dilated.  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  There is tricuspid moderate regurgitation.  The aortic valve appears to be moderately regurgitant as well.   Past Medical History:  Diagnosis Date   Anxiety    Aortic stenosis, mild 02/11/2020   Mean gradient 9 mmHg on 11/2019   Carotid stenosis, bilateral 02/11/2020   Diabetes mellitus without complication (HCC)    High cholesterol    HTN (hypertension)    Hyperlipidemia LDL goal <70 02/11/2020    Past Surgical History:  Procedure Laterality Date   ABDOMINAL HYSTERECTOMY     ABDOMINAL HYSTERECTOMY   BACK SURGERY     BREAST BIOPSY Left    CATARACT EXTRACTION, BILATERAL Right 04/15/2022   R performed on 04/29/22   NECK SURGERY     SHOULDER SURGERY     X2     Current Outpatient Medications   Medication Sig Dispense Refill   Accu-Chek Softclix Lancets lancets Use as instructed to check blood sugars 1 time per day dx: e11.9 150 each 3   Alcohol Swabs  (B-D SINGLE USE SWABS  BUTTERFLY) PADS 100 Pieces by Does not apply route as needed. 100 each 3   amLODipine  (NORVASC ) 5 MG tablet TAKE 1 TABLET BY MOUTH EVERY EVENING 90 tablet 3   aspirin 81 MG tablet Take 81 mg by mouth daily.     atorvastatin  (LIPITOR) 40 MG tablet TAKE 1 TABLET EVERY NIGHT 90 tablet 3   Blood Glucose Monitoring Suppl (ACCU-CHEK GUIDE) w/Device KIT Use to check blood sugars once daily E11.9 1 kit 2   celecoxib  (CELEBREX ) 100 MG capsule Take 100 mg by mouth 2 (two) times daily.     cycloSPORINE (RESTASIS) 0.05 % ophthalmic emulsion Place into both eyes 2 (two) times daily.     glucose blood (ACCU-CHEK AVIVA PLUS) test strip Use as instructed to check blood sugars 1 time per day dx: e11.22 150 each 3   glucose blood (ACCU-CHEK GUIDE) test strip Use as instructed to check blood sugars once daily E11.9 100 each 3   losartan  (COZAAR ) 50 MG tablet Take 1 tablet (50 mg total) by mouth daily. 90 tablet 3   metFORMIN  (GLUCOPHAGE -XR) 750 MG 24 hr tablet Take  2 tablets (1,500 mg total) by mouth daily after supper. 180 tablet 2   sitaGLIPtin  (JANUVIA ) 100 MG tablet Take 1 tablet (100 mg total) by mouth daily. 90 tablet 1   tiZANidine  (ZANAFLEX ) 4 MG tablet TAKE 1 TABLET (4 MG TOTAL) BY MOUTH DAILY AS NEEDED FOR MUSCLE SPASM 90 tablet 1   venlafaxine  (EFFEXOR ) 37.5 MG tablet TAKE 1 TABLET BY MOUTH EVERY DAY 90 tablet 2   No current facility-administered medications for this visit.    Allergies:   Codeine    Social History:  The patient  reports that she has never smoked. She has never used smokeless tobacco. She reports that she does not drink alcohol and does not use drugs.   Family History:  The patient's family history includes Breast cancer in her mother; Heart attack in her brother, daughter, father, maternal  grandmother, mother, and paternal grandmother; Heart disease in her mother; Stroke in her father.    ROS:  Please see the history of present illness.   Otherwise, review of systems are positive for none.   All other systems are reviewed and negative.    PHYSICAL EXAM: VS:  BP (!) 140/70   Pulse 71   Ht 5\' 1"  (1.549 m)   Wt 139 lb (63 kg)   SpO2 92%   BMI 26.26 kg/m  , BMI Body mass index is 26.26 kg/m. GENERAL:  Well appearing HEENT:  Pupils equal round and reactive, fundi not visualized, oral mucosa unremarkable NECK:  No jugular venous distention, waveform within normal limits, carotid upstroke brisk and symmetric, no bruits, no thyromegaly LYMPHATICS:  No cervical, inguinal adenopathy LUNGS:  Clear to auscultation bilaterally BACK:  No CVA tenderness CHEST:  Unremarkable HEART:  PMI not displaced or sustained,S1 and S2 within normal limits, no S3, no S4, no clicks, no rubs, 3 out of 6 systolic murmur radiating to the axilla, no diastolic murmurs ABD:  Flat, positive bowel sounds normal in frequency in pitch, no bruits, no rebound, no guarding, no midline pulsatile mass, no hepatomegaly, no splenomegaly EXT:  2 plus pulses throughout, no edema, no cyanosis no clubbing SKIN:  No rashes no nodules NEURO:  Cranial nerves II through XII grossly intact, motor grossly intact throughout PSYCH:  Cognitively intact, oriented to person place and time    EKG:  EKG Interpretation Date/Time:  Thursday Sep 01 2023 09:36:21 EDT Ventricular Rate:  72 PR Interval:  122 QRS Duration:  98 QT Interval:  382 QTC Calculation: 418 R Axis:   0  Text Interpretation: Normal sinus rhythm When compared with ECG of 19-Apr-2006 08:55, No significant change since last tracing Confirmed by Eilleen Grates ([id number]) on 09/01/2023 9:41:38 AM     Recent Labs: 09/09/2022: TSH 2.390 05/18/2023: ALT 13; Hemoglobin 13.2; Platelets 196 08/22/2023: BUN 9; Creatinine, Ser 0.53; Potassium 4.6; Sodium 144    Lipid  Panel    Component Value Date/Time   CHOL 170 05/18/2023 1451   TRIG 84 05/18/2023 1451   HDL 60 05/18/2023 1451   CHOLHDL 2.8 05/18/2023 1451   LDLCALC 94 05/18/2023 1451      Wt Readings from Last 3 Encounters:  09/01/23 139 lb (63 kg)  08/22/23 139 lb 9.6 oz (63.3 kg)  05/18/23 144 lb 3.2 oz (65.4 kg)      Other studies Reviewed: Additional studies/ records that were reviewed today include: None. Review of the above records demonstrates:  Please see elsewhere in the note.     ASSESSMENT AND PLAN:  MR:  Patient appears to have severe MR on the transthoracic echo.  She is going to need a TEE which I would like to do prior to any knee surgery.  Currently she actually has no symptoms.  Aortic stenosis:  Echo in 2021 demonstrated mild AS.    She also has moderate aortic insufficiency.  This will be evaluated at the time of her echo.  Carotid stenosis:    She had mild carotid stenosis in 2022.   No further imaging.  HTN: The blood pressure is mildly elevated today.  She can keep a blood pressure diary and follow-up with this.  Preop: Preop clearance will be based on the results of the TEE.  I suspect we will let her have this elective surgery but I like to do the valve evaluation first.  Current medicines are reviewed at length with the patient today.  The patient does not have concerns regarding medicines.  The following changes have been made:  no change  Labs/ tests ordered today include:   Orders Placed This Encounter  Procedures   Basic metabolic panel with GFR   CBC   EKG 12-Lead     Disposition:   FU with with me after the TEE    Signed, Eilleen Grates, MD  09/01/2023 10:50 AM    [facility]

## 2023-08-30 NOTE — Progress Notes (Unsigned)
 Cardiology Office Note   Date:  09/01/2023   ID:  Virginia Russell, [place] 02/03/1946, MRN [id number]  PCP:  Virginia Curling, MD  Cardiologist:   Eilleen Grates, MD Referring:  Virginia Curling, MD  Chief Complaint  Patient presents with   Mitral Regurgitation      History of Present Illness: Virginia Russell is a 78 y.o. female who presents for with a hx of DM, HTN, HLD who presents for follow-up of chest pain. She was evaluated by Dr. Theodis Fiscal for chest pain in 2021. She had a normal stress test.  She had mild AS on echo in 2021.     She needs to have knee replacement.  Prior to this she had an echocardiogram ordered by her primary provider to follow-up murmur.  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  She denies any shortness of breath, PND or orthopnea.  She has had no palpitations, presyncope or syncope.  She has had no weight gain or edema.  She moves much slower because of the knee.  Her echo demonstrated the EF to be 65 to 70%.  Left atrial size was severely dilated.  Right atrial size was moderately dilated.  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  There is tricuspid moderate regurgitation.  The aortic valve appears to be moderately regurgitant as well.   Past Medical History:  Diagnosis Date   Anxiety    Aortic stenosis, mild 02/11/2020   Mean gradient 9 mmHg on 11/2019   Carotid stenosis, bilateral 02/11/2020   Diabetes mellitus without complication (HCC)    High cholesterol    HTN (hypertension)    Hyperlipidemia LDL goal <70 02/11/2020    Past Surgical History:  Procedure Laterality Date   ABDOMINAL HYSTERECTOMY     ABDOMINAL HYSTERECTOMY   BACK SURGERY     BREAST BIOPSY Left    CATARACT EXTRACTION, BILATERAL Right 04/15/2022   R performed on 04/29/22   NECK SURGERY     SHOULDER SURGERY     X2     Current Outpatient Medications   Medication Sig Dispense Refill   Accu-Chek Softclix Lancets lancets Use as instructed to check blood sugars 1 time per day dx: e11.9 150 each 3   Alcohol Swabs  (B-D SINGLE USE SWABS  BUTTERFLY) PADS 100 Pieces by Does not apply route as needed. 100 each 3   amLODipine  (NORVASC ) 5 MG tablet TAKE 1 TABLET BY MOUTH EVERY EVENING 90 tablet 3   aspirin 81 MG tablet Take 81 mg by mouth daily.     atorvastatin  (LIPITOR) 40 MG tablet TAKE 1 TABLET EVERY NIGHT 90 tablet 3   Blood Glucose Monitoring Suppl (ACCU-CHEK GUIDE) w/Device KIT Use to check blood sugars once daily E11.9 1 kit 2   celecoxib  (CELEBREX ) 100 MG capsule Take 100 mg by mouth 2 (two) times daily.     cycloSPORINE (RESTASIS) 0.05 % ophthalmic emulsion Place into both eyes 2 (two) times daily.     glucose blood (ACCU-CHEK AVIVA PLUS) test strip Use as instructed to check blood sugars 1 time per day dx: e11.22 150 each 3   glucose blood (ACCU-CHEK GUIDE) test strip Use as instructed to check blood sugars once daily E11.9 100 each 3   losartan  (COZAAR ) 50 MG tablet Take 1 tablet (50 mg total) by mouth daily. 90 tablet 3   metFORMIN  (GLUCOPHAGE -XR) 750 MG 24 hr tablet Take  2 tablets (1,500 mg total) by mouth daily after supper. 180 tablet 2   sitaGLIPtin  (JANUVIA ) 100 MG tablet Take 1 tablet (100 mg total) by mouth daily. 90 tablet 1   tiZANidine  (ZANAFLEX ) 4 MG tablet TAKE 1 TABLET (4 MG TOTAL) BY MOUTH DAILY AS NEEDED FOR MUSCLE SPASM 90 tablet 1   venlafaxine  (EFFEXOR ) 37.5 MG tablet TAKE 1 TABLET BY MOUTH EVERY DAY 90 tablet 2   No current facility-administered medications for this visit.    Allergies:   Codeine    Social History:  The patient  reports that she has never smoked. She has never used smokeless tobacco. She reports that she does not drink alcohol and does not use drugs.   Family History:  The patient's family history includes Breast cancer in her mother; Heart attack in her brother, daughter, father, maternal  grandmother, mother, and paternal grandmother; Heart disease in her mother; Stroke in her father.    ROS:  Please see the history of present illness.   Otherwise, review of systems are positive for none.   All other systems are reviewed and negative.    PHYSICAL EXAM: VS:  BP (!) 140/70   Pulse 71   Ht 5\' 1"  (1.549 m)   Wt 139 lb (63 kg)   SpO2 92%   BMI 26.26 kg/m  , BMI Body mass index is 26.26 kg/m. GENERAL:  Well appearing HEENT:  Pupils equal round and reactive, fundi not visualized, oral mucosa unremarkable NECK:  No jugular venous distention, waveform within normal limits, carotid upstroke brisk and symmetric, no bruits, no thyromegaly LYMPHATICS:  No cervical, inguinal adenopathy LUNGS:  Clear to auscultation bilaterally BACK:  No CVA tenderness CHEST:  Unremarkable HEART:  PMI not displaced or sustained,S1 and S2 within normal limits, no S3, no S4, no clicks, no rubs, 3 out of 6 systolic murmur radiating to the axilla, no diastolic murmurs ABD:  Flat, positive bowel sounds normal in frequency in pitch, no bruits, no rebound, no guarding, no midline pulsatile mass, no hepatomegaly, no splenomegaly EXT:  2 plus pulses throughout, no edema, no cyanosis no clubbing SKIN:  No rashes no nodules NEURO:  Cranial nerves II through XII grossly intact, motor grossly intact throughout PSYCH:  Cognitively intact, oriented to person place and time    EKG:  EKG Interpretation Date/Time:  Thursday Sep 01 2023 09:36:21 EDT Ventricular Rate:  72 PR Interval:  122 QRS Duration:  98 QT Interval:  382 QTC Calculation: 418 R Axis:   0  Text Interpretation: Normal sinus rhythm When compared with ECG of 19-Apr-2006 08:55, No significant change since last tracing Confirmed by Eilleen Grates ([id number]) on 09/01/2023 9:41:38 AM     Recent Labs: 09/09/2022: TSH 2.390 05/18/2023: ALT 13; Hemoglobin 13.2; Platelets 196 08/22/2023: BUN 9; Creatinine, Ser 0.53; Potassium 4.6; Sodium 144    Lipid  Panel    Component Value Date/Time   CHOL 170 05/18/2023 1451   TRIG 84 05/18/2023 1451   HDL 60 05/18/2023 1451   CHOLHDL 2.8 05/18/2023 1451   LDLCALC 94 05/18/2023 1451      Wt Readings from Last 3 Encounters:  09/01/23 139 lb (63 kg)  08/22/23 139 lb 9.6 oz (63.3 kg)  05/18/23 144 lb 3.2 oz (65.4 kg)      Other studies Reviewed: Additional studies/ records that were reviewed today include: None. Review of the above records demonstrates:  Please see elsewhere in the note.     ASSESSMENT AND PLAN:  MR:  Patient appears to have severe MR on the transthoracic echo.  She is going to need a TEE which I would like to do prior to any knee surgery.  Currently she actually has no symptoms.  Aortic stenosis:  Echo in 2021 demonstrated mild AS.    She also has moderate aortic insufficiency.  This will be evaluated at the time of her echo.  Carotid stenosis:    She had mild carotid stenosis in 2022.   No further imaging.  HTN: The blood pressure is mildly elevated today.  She can keep a blood pressure diary and follow-up with this.  Preop: Preop clearance will be based on the results of the TEE.  I suspect we will let her have this elective surgery but I like to do the valve evaluation first.  Current medicines are reviewed at length with the patient today.  The patient does not have concerns regarding medicines.  The following changes have been made:  no change  Labs/ tests ordered today include:   Orders Placed This Encounter  Procedures   Basic metabolic panel with GFR   CBC   EKG 12-Lead     Disposition:   FU with with me after the TEE    Signed, Eilleen Grates, MD  09/01/2023 10:50 AM    [facility]

## 2023-09-01 ENCOUNTER — Ambulatory Visit: Attending: Cardiology | Admitting: Cardiology

## 2023-09-01 ENCOUNTER — Telehealth (HOSPITAL_BASED_OUTPATIENT_CLINIC_OR_DEPARTMENT_OTHER): Payer: Self-pay | Admitting: *Deleted

## 2023-09-01 ENCOUNTER — Encounter: Payer: Self-pay | Admitting: Cardiology

## 2023-09-01 VITALS — BP 140/70 | HR 71 | Ht 61.0 in | Wt 139.0 lb

## 2023-09-01 DIAGNOSIS — I350 Nonrheumatic aortic (valve) stenosis: Secondary | ICD-10-CM | POA: Diagnosis not present

## 2023-09-01 DIAGNOSIS — E785 Hyperlipidemia, unspecified: Secondary | ICD-10-CM

## 2023-09-01 DIAGNOSIS — I340 Nonrheumatic mitral (valve) insufficiency: Secondary | ICD-10-CM | POA: Diagnosis not present

## 2023-09-01 DIAGNOSIS — I10 Essential (primary) hypertension: Secondary | ICD-10-CM | POA: Diagnosis not present

## 2023-09-01 DIAGNOSIS — I6523 Occlusion and stenosis of bilateral carotid arteries: Secondary | ICD-10-CM

## 2023-09-01 NOTE — Addendum Note (Signed)
 Addended by: Bebe Bourdon on: 09/01/2023 02:38 PM   Modules accepted: Orders

## 2023-09-01 NOTE — Patient Instructions (Addendum)
 Medication Instructions:   No changes *If you need a refill on your cardiac medications before your next appointment, please call your pharmacy*   Lab Work: CBC BMP If you have labs (blood work) drawn today and your tests are completely normal, you will receive your results only by: MyChart Message (if you have MyChart) OR A paper copy in the mail If you have any lab test that is abnormal or we need to change your treatment, we will call you to review the results.   Testing/Procedures: It will be schedule at [facility]  . You will be schedule for transesophageal  echocardiogram   Follow-Up: At [facility], you and your health needs are our priority.  As part of our continuing mission to provide you with exceptional heart care, we have created designated Provider Care Teams.  These Care Teams include your primary Cardiologist (physician) and Advanced Practice Providers (APPs -  Physician Assistants and Nurse Practitioners) who all work together to provide you with the care you need, when you need it.     Your next appointment:   2 to 3 week(s)  The format for your next appointment:   In Person  Provider:   Dr Lavonne Prairie

## 2023-09-01 NOTE — Telephone Encounter (Signed)
   Pre-operative Risk Assessment    Patient Name: Virginia Russell [facility]  DOB: 1945-10-17 MRN: [id number]   Date of last office visit: 06/26/2020 Date of next office visit: 09/01/2023 Pre Op sent today to Dr. Danley Dusky  Request for Surgical Clearance    Procedure:  Right Total Knee Arthroplasty  Date of Surgery:  Clearance TBD                                 Surgeon:  Dr. Randal Bury Surgeon's Group or Practice Name:  [facility] Phone number:  [phone] Fax number:  [phone]   Type of Clearance Requested:   - Medical  - Pharmacy:  Hold Aspirin Not Indicated   Type of Anesthesia:  Spinal   Additional requests/questions:    Signed, Lauris Port   09/01/2023, 10:17 AM

## 2023-09-02 ENCOUNTER — Ambulatory Visit: Payer: Self-pay | Admitting: Cardiology

## 2023-09-02 LAB — CBC
Hematocrit: 38 % (ref 34.0–46.6)
Hemoglobin: 12.1 g/dL (ref 11.1–15.9)
MCH: 31 pg (ref 26.6–33.0)
MCHC: 31.8 g/dL (ref 31.5–35.7)
MCV: 97 fL (ref 79–97)
Platelets: 159 10*3/uL (ref 150–450)
RBC: 3.9 x10E6/uL (ref 3.77–5.28)
RDW: 12.7 % (ref 11.7–15.4)
WBC: 6.9 10*3/uL (ref 3.4–10.8)

## 2023-09-02 LAB — BASIC METABOLIC PANEL WITH GFR
BUN/Creatinine Ratio: 22 (ref 12–28)
BUN: 12 mg/dL (ref 8–27)
CO2: 24 mmol/L (ref 20–29)
Calcium: 9.5 mg/dL (ref 8.7–10.3)
Chloride: 103 mmol/L (ref 96–106)
Creatinine, Ser: 0.54 mg/dL — ABNORMAL LOW (ref 0.57–1.00)
Glucose: 141 mg/dL — ABNORMAL HIGH (ref 70–99)
Potassium: 4.4 mmol/L (ref 3.5–5.2)
Sodium: 142 mmol/L (ref 134–144)
eGFR: 94 mL/min/{1.73_m2} (ref >59)

## 2023-09-06 NOTE — Progress Notes (Signed)
 Pt called for pre procedure instructions. Arrival time 0830 NPO after midnight explained Instructed to take am meds with sip of water, hold Januvia  in the morning. Instructed pt need for ride home tomorrow and have responsible adult with them for 24 hrs post procedure.

## 2023-09-07 ENCOUNTER — Ambulatory Visit (HOSPITAL_COMMUNITY)
Admission: RE | Admit: 2023-09-07 | Discharge: 2023-09-07 | Disposition: A | Discharge status: Home / Self Care | Attending: Cardiovascular Disease | Admitting: Cardiovascular Disease

## 2023-09-07 ENCOUNTER — Encounter (HOSPITAL_COMMUNITY)
Admission: RE | Disposition: A | Discharge status: Home / Self Care | Payer: Self-pay | Source: Home / Self Care | Attending: Cardiovascular Disease

## 2023-09-07 ENCOUNTER — Encounter (HOSPITAL_COMMUNITY): Payer: Self-pay | Admitting: Cardiovascular Disease

## 2023-09-07 ENCOUNTER — Ambulatory Visit (HOSPITAL_COMMUNITY)

## 2023-09-07 ENCOUNTER — Other Ambulatory Visit: Payer: Self-pay

## 2023-09-07 DIAGNOSIS — Z79899 Other long term (current) drug therapy: Secondary | ICD-10-CM | POA: Diagnosis not present

## 2023-09-07 DIAGNOSIS — I340 Nonrheumatic mitral (valve) insufficiency: Secondary | ICD-10-CM

## 2023-09-07 DIAGNOSIS — I6523 Occlusion and stenosis of bilateral carotid arteries: Secondary | ICD-10-CM | POA: Diagnosis not present

## 2023-09-07 DIAGNOSIS — E1151 Type 2 diabetes mellitus with diabetic peripheral angiopathy without gangrene: Secondary | ICD-10-CM | POA: Diagnosis not present

## 2023-09-07 DIAGNOSIS — E119 Type 2 diabetes mellitus without complications: Secondary | ICD-10-CM | POA: Insufficient documentation

## 2023-09-07 DIAGNOSIS — I083 Combined rheumatic disorders of mitral, aortic and tricuspid valves: Secondary | ICD-10-CM | POA: Insufficient documentation

## 2023-09-07 DIAGNOSIS — Z7984 Long term (current) use of oral hypoglycemic drugs: Secondary | ICD-10-CM | POA: Diagnosis not present

## 2023-09-07 DIAGNOSIS — I350 Nonrheumatic aortic (valve) stenosis: Secondary | ICD-10-CM | POA: Diagnosis not present

## 2023-09-07 DIAGNOSIS — E785 Hyperlipidemia, unspecified: Secondary | ICD-10-CM | POA: Insufficient documentation

## 2023-09-07 DIAGNOSIS — I10 Essential (primary) hypertension: Secondary | ICD-10-CM | POA: Diagnosis not present

## 2023-09-07 HISTORY — PX: TRANSESOPHAGEAL ECHOCARDIOGRAM (CATH LAB): EP1270

## 2023-09-07 LAB — ECHO TEE
AR max vel: 1.33 cm2
AV Area VTI: 1.13 cm2
AV Area mean vel: 1.14 cm2
AV Mean grad: 9 mmHg
AV Peak grad: 16 mmHg
Ao pk vel: 2 m/s
Area-P 1/2: 5.38 cm2
MV VTI: 1.58 cm2

## 2023-09-07 LAB — GLUCOSE, CAPILLARY: Glucose-Capillary: 138 mg/dL — ABNORMAL HIGH (ref 70–99)

## 2023-09-07 SURGERY — TRANSESOPHAGEAL ECHOCARDIOGRAM (TEE) (CATHLAB)
Anesthesia: Monitor Anesthesia Care

## 2023-09-07 MED ORDER — LIDOCAINE 2% (20 MG/ML) 5 ML SYRINGE
INTRAMUSCULAR | Status: DC | PRN
  Administered 2023-09-07: 60 mg via INTRAVENOUS

## 2023-09-07 MED ORDER — PROPOFOL 10 MG/ML IV BOLUS
INTRAVENOUS | Status: DC | PRN
  Administered 2023-09-07 (×2): 20 mg via INTRAVENOUS
  Administered 2023-09-07: 30 mg via INTRAVENOUS
  Administered 2023-09-07: 20 mg via INTRAVENOUS
  Administered 2023-09-07: 40 mg via INTRAVENOUS
  Administered 2023-09-07: 20 mg via INTRAVENOUS
  Administered 2023-09-07: 30 mg via INTRAVENOUS
  Administered 2023-09-07: 20 mg via INTRAVENOUS
  Administered 2023-09-07 (×2): 30 mg via INTRAVENOUS
  Administered 2023-09-07: 60 mg via INTRAVENOUS

## 2023-09-07 MED ORDER — SODIUM CHLORIDE 0.9 % IV SOLN: INTRAVENOUS | Status: DC

## 2023-09-07 NOTE — Interval H&P Note (Signed)
 History and Physical Interval Note:  09/07/2023 8:54 AM  Virginia Russell  has presented today for surgery, with the diagnosis of MITRAL REGURGITATION.  The various methods of treatment have been discussed with the patient and family. After consideration of risks, benefits and other options for treatment, the patient has consented to  Procedure(s): TRANSESOPHAGEAL ECHOCARDIOGRAM (N/A) as a surgical intervention.  The patient's history has been reviewed, patient examined, no change in status, stable for surgery.  I have reviewed the patient's chart and labs.  Questions were answered to the patient's satisfaction.     Janelle Mediate

## 2023-09-07 NOTE — CV Procedure (Signed)
 TEE Anesthesia Propofol Patient indicated dysphagia to chicken prior to study Passing probe some resistance at GE junction Consider F/u with GI  Moderate MR at most. 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 Mean diastolic gradient 6 mmHg But no MS by PT1/2 Mild AS trivial AR EF 60-65%  Mild TR No effusion No ASD/PFO No LAA thrombus Mild aortic root dilatation Normal RV Severe LAE  Patient should be fine to proceed with TKR with Dr Abigail Abler Given severe LAE she is at risk for PAF   See full report in [employer]  Janelle Mediate MD [facility]

## 2023-09-07 NOTE — Anesthesia Preprocedure Evaluation (Signed)
 Anesthesia Evaluation  Patient identified by MRN, date of birth, ID band Patient awake    Reviewed: Allergy & Precautions, NPO status , Patient's Chart, lab work & pertinent test results  History of Anesthesia Complications Negative for: history of anesthetic complications  Airway Mallampati: III  TM Distance: >3 FB Neck ROM: Full    Dental  (+) Dental Advisory Given,    Pulmonary neg shortness of breath, neg sleep apnea, neg COPD, neg recent URI   breath sounds clear to auscultation       Cardiovascular hypertension, Pt. on medications (-) angina + Peripheral Vascular Disease  (-) Past MI + Valvular Problems/Murmurs  Rhythm:Irregular  1. Left ventricular ejection fraction, by estimation, is 65 to 70%. The  left ventricle has normal function. The left ventricle has no regional  wall motion abnormalities. Left ventricular diastolic function could not  be evaluated. The average left  ventricular global longitudinal strain is -21.5 %. The global longitudinal  strain is normal.   2. Right ventricular systolic function is normal. The right ventricular  size is normal.   3. Left atrial size was severely dilated.   4. Right atrial size was moderately dilated.   5. 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 Mild  mitral stenosis. Moderate mitral annular calcification.   6. Tricuspid valve regurgitation is moderate.   7. The aortic valve is calcified. Aortic valve regurgitation is moderate.  Mild to moderate aortic valve stenosis. Aortic regurgitation PHT measures  308 msec. Aortic valve area, by VTI measures 1.54 cm. Aortic valve mean  gradient measures 13.6 mmHg.  Aortic valve Vmax measures 2.41 m/s.   8. The inferior vena cava is normal in size with greater than 50%  respiratory variability, suggesting right atrial pressure of 3 mmHg.      Neuro/Psych  PSYCHIATRIC DISORDERS Anxiety Depression    negative  neurological ROS     GI/Hepatic negative GI ROS, Neg liver ROS,,,  Endo/Other  diabetes, Type 2    Renal/GU Lab Results      Component                Value               Date                      NA                       142                 09/01/2023                K                        4.4                 09/01/2023                CO2                      24                  09/01/2023                GLUCOSE                  141 (H)  09/01/2023                BUN                      12                  09/01/2023                CREATININE               0.54 (L)            09/01/2023                CALCIUM                   9.5                 09/01/2023                EGFR                     94                  09/01/2023                GFRNONAA                 84                  03/13/2020                Musculoskeletal  (+) Arthritis ,    Abdominal   Peds  Hematology Lab Results      Component                Value               Date                      WBC                      6.9                 09/01/2023                HGB                      12.1                09/01/2023                HCT                      38.0                09/01/2023                MCV                      97                  09/01/2023                PLT                      159  09/01/2023              Anesthesia Other Findings   Reproductive/Obstetrics                              Anesthesia Physical Anesthesia Plan  ASA: 3  Anesthesia Plan: MAC   Post-op Pain Management: Minimal or no pain anticipated   Induction: Intravenous  PONV Risk Score and Plan: 2 and Propofol infusion and Treatment may vary due to age or medical condition  Airway Management Planned: Nasal Cannula, Natural Airway and Simple Face Mask  Additional Equipment: None  Intra-op Plan:   Post-operative Plan:   Informed Consent: I have reviewed the  patients History and Physical, chart, labs and discussed the procedure including the risks, benefits and alternatives for the proposed anesthesia with the patient or authorized representative who has indicated his/her understanding and acceptance.     Dental advisory given  Plan Discussed with: CRNA  Anesthesia Plan Comments:          Anesthesia Quick Evaluation

## 2023-09-07 NOTE — Progress Notes (Signed)
  Echocardiogram Echocardiogram Transesophageal has been performed.  Aariana Shankland 09/07/2023, 10:09 AM

## 2023-09-07 NOTE — Transfer of Care (Signed)
 Immediate Anesthesia Transfer of Care Note  Patient: Virginia Russell  Procedure(s) Performed: TRANSESOPHAGEAL ECHOCARDIOGRAM  Patient Location: PACU  Anesthesia Type:MAC  Level of Consciousness: drowsy  Airway & Oxygen Therapy: Patient Spontanous Breathing  Post-op Assessment: Report given to RN and Post -op Vital signs reviewed and stable  Post vital signs: Reviewed and stable  Last Vitals:  Vitals Value Taken Time  BP    Temp    Pulse    Resp    SpO2      Last Pain:  Vitals:   09/07/23 0914  TempSrc:   PainSc: 0-No pain         Complications: No notable events documented.

## 2023-09-08 ENCOUNTER — Ambulatory Visit: Payer: Self-pay | Admitting: Cardiology

## 2023-09-09 ENCOUNTER — Encounter: Payer: Self-pay | Admitting: Cardiology

## 2023-09-13 NOTE — Anesthesia Postprocedure Evaluation (Signed)
 Anesthesia Post Note  Patient: Virginia Russell  Procedure(s) Performed: TRANSESOPHAGEAL ECHOCARDIOGRAM     Patient location during evaluation: Cath Lab Anesthesia Type: MAC Level of consciousness: awake and alert Pain management: pain level controlled Vital Signs Assessment: post-procedure vital signs reviewed and stable Respiratory status: spontaneous breathing, nonlabored ventilation and respiratory function stable Cardiovascular status: stable and blood pressure returned to baseline Postop Assessment: no apparent nausea or vomiting Anesthetic complications: no   No notable events documented.             Asani Mcburney

## 2023-09-19 DIAGNOSIS — M1711 Unilateral primary osteoarthritis, right knee: Secondary | ICD-10-CM | POA: Diagnosis not present

## 2023-09-21 NOTE — Care Plan (Signed)
 Ortho Bundle Case Management Note  Patient Details  Name: Virginia Russell [facility] MRN: [id number] Date of Birth: 1945/09/07   met with patient in the office for H&P. will discharge to home with family to assist. rolling walker and CPM ordered for home use. OPPT set up with [facility]. discharge instructions discussed and questions answered. Patient and MD in agreement with plan. Choice offered.                   DME Arranged:  Otho Blitz rolling, CPM DME Agency:  [employer]  HH Arranged:    HH Agency:     Additional Comments: Please contact me with any questions of if this plan should need to change.  Cornelia Dieter,  RN,BSN,MHA,CCM  [facility] Orthopaedic Specialist  [phone] 09/21/2023, 3:40 PM

## 2023-09-23 NOTE — Patient Instructions (Signed)
 SURGICAL WAITING ROOM VISITATION Patients having surgery or a procedure may have no more than 2 support people in the waiting area - these visitors may rotate.    Children under the age of 85 must have an adult with them who is not the patient.  If the patient needs to stay at the hospital during part of their recovery, the visitor guidelines for inpatient rooms apply. Pre-op nurse will coordinate an appropriate time for 1 support person to accompany patient in pre-op.  This support person may not rotate.    Please refer to the [facility] website for the visitor guidelines for Inpatients (after your surgery is over and you are in a regular room).       Your procedure is scheduled on: 10-04-23   Report to [facility] Main Entrance    Report to admitting at 6:45 AM   Call this number if you have problems the morning of surgery [phone]   Do not eat food :After Midnight.   After Midnight you may have the following liquids until 6:15 AM DAY OF SURGERY  Water Non-Citrus Juices (without pulp, NO RED-Apple, White grape, White cranberry) Black Coffee (NO MILK/CREAM OR CREAMERS, sugar ok)  Clear Tea (NO MILK/CREAM OR CREAMERS, sugar ok) regular and decaf                             Plain Jell-O (NO RED)                                           Fruit ices (not with fruit pulp, NO RED)                                     Popsicles (NO RED)                                                               Sports drinks like Gatorade (NO RED)                   The day of surgery:  Drink ONE (1) Pre-Surgery G2 by 6:15 AM the morning of surgery. Drink in one sitting. Do not sip.  This drink was given to you during your hospital  pre-op appointment visit. Nothing else to drink after completing the Pre-Surgery G2.          If you have questions, please contact your surgeon's office.   FOLLOW  ANY ADDITIONAL PRE OP INSTRUCTIONS YOU RECEIVED FROM YOUR SURGEON'S OFFICE!!!     Oral  Hygiene is also important to reduce your risk of infection.                                    Remember - BRUSH YOUR TEETH THE MORNING OF SURGERY WITH YOUR REGULAR TOOTHPASTE   Do NOT smoke after Midnight   Take these medicines the morning of surgery with A SIP OF WATER:    Effexor    Okay to use  eyedrops and nasal spray  Stop all vitamins and herbal supplements 7 days before surgery  How to Manage Your Diabetes Before and After Surgery  Why is it important to control my blood sugar before and after surgery? Improving blood sugar levels before and after surgery helps healing and can limit problems. A way of improving blood sugar control is eating a healthy diet by:  Eating less sugar and carbohydrates  Increasing activity/exercise  Talking with your doctor about reaching your blood sugar goals High blood sugars (greater than 180 mg/dL) can raise your risk of infections and slow your recovery, so you will need to focus on controlling your diabetes during the weeks before surgery. Make sure that the doctor who takes care of your diabetes knows about your planned surgery including the date and location.  How do I manage my blood sugar before surgery? Check your blood sugar at least 4 times a day, starting 2 days before surgery, to make sure that the level is not too high or low. Check your blood sugar the morning of your surgery when you wake up and every 2 hours until you get to the Short Stay unit. If your blood sugar is less than 70 mg/dL, you will need to treat for low blood sugar: Do not take insulin . Treat a low blood sugar (less than 70 mg/dL) with  cup of clear juice (cranberry or apple), 4 glucose tablets, OR glucose gel. Recheck blood sugar in 15 minutes after treatment (to make sure it is greater than 70 mg/dL). If your blood sugar is not greater than 70 mg/dL on recheck, call [phone] for further instructions. Report your blood sugar to the short stay nurse when you get to  Short Stay.  If you are admitted to the hospital after surgery: Your blood sugar will be checked by the staff and you will probably be given insulin  after surgery (instead of oral diabetes medicines) to make sure you have good blood sugar levels. The goal for blood sugar control after surgery is 80-180 mg/dL.   WHAT DO I DO ABOUT MY DIABETES MEDICATION?  Do not take oral diabetes medicines (pills) the morning of surgery. (Do not take Metformin  or Januvia  the morning of surgery)  DO NOT TAKE THE FOLLOWING 7 DAYS PRIOR TO SURGERY: Ozempic, Wegovy, Rybelsus (Semaglutide), Byetta (exenatide), Bydureon (exenatide ER), Victoza, Saxenda (liraglutide), or Trulicity (dulaglutide) Mounjaro (Tirzepatide) Adlyxin (Lixisenatide), Polyethylene Glycol Loxenatide.  Reviewed and Endorsed by [facility] Patient Education Committee, August 2015  Bring CPAP mask and tubing day of surgery.                              You may not have any metal on your body including hair pins, jewelry, and body piercing             Do not wear make-up, lotions, powders, perfumes, or deodorant  Do not wear nail polish including gel and S&S, artificial/acrylic nails, or any other type of covering on natural nails including finger and toenails. If you have artificial nails, gel coating, etc. that needs to be removed by a nail salon please have this removed prior to surgery or surgery may need to be canceled/ delayed if the surgeon/ anesthesia feels like they are unable to be safely monitored.   Do not shave  48 hours prior to surgery.              Do not bring valuables  to the hospital. [facility] IS NOT RESPONSIBLE   FOR VALUABLES.   Contacts, dentures or bridgework may not be worn into surgery.  DO NOT BRING YOUR HOME MEDICATIONS TO THE HOSPITAL. PHARMACY WILL DISPENSE MEDICATIONS LISTED ON YOUR MEDICATION LIST TO YOU DURING YOUR ADMISSION IN THE HOSPITAL!    Patients discharged on the day of surgery will not be allowed  to drive home.  Someone NEEDS to stay with you for the first 24 hours after anesthesia.   Special Instructions: Bring a copy of your healthcare power of attorney and living will documents the day of surgery if you haven't scanned them before.              Please read over the following fact sheets you were given: IF YOU HAVE QUESTIONS ABOUT YOUR PRE-OP INSTRUCTIONS PLEASE CALL [phone] Gwen  If you received a COVID test during your pre-op visit  it is requested that you wear a mask when out in public, stay away from anyone that may not be feeling well and notify your surgeon if you develop symptoms. If you test positive for Covid or have been in contact with anyone that has tested positive in the last 10 days please notify you surgeon.    Pre-operative 5 CHG Bath Instructions   You can play a key role in reducing the risk of infection after surgery. Your skin needs to be as free of germs as possible. You can reduce the number of germs on your skin by washing with CHG (chlorhexidine gluconate) soap before surgery. CHG is an antiseptic soap that kills germs and continues to kill germs even after washing.   DO NOT use if you have an allergy to chlorhexidine/CHG or antibacterial soaps. If your skin becomes reddened or irritated, stop using the CHG and notify one of our RNs at [phone].   Please shower with the CHG soap starting 4 days before surgery using the following schedule:     Please keep in mind the following:  DO NOT shave, including legs and underarms, starting the day of your first shower.   You may shave your face at any point before/day of surgery.  Place clean sheets on your bed the day you start using CHG soap. Use a clean washcloth (not used since being washed) for each shower. DO NOT sleep with pets once you start using the CHG.   CHG Shower Instructions:  If you choose to wash your hair and private area, wash first with your normal shampoo/soap.  After you use  shampoo/soap, rinse your hair and body thoroughly to remove shampoo/soap residue.  Turn the water OFF and apply about 3 tablespoons (45 ml) of CHG soap to a CLEAN washcloth.  Apply CHG soap ONLY FROM YOUR NECK DOWN TO YOUR TOES (washing for 3-5 minutes)  DO NOT use CHG soap on face, private areas, open wounds, or sores.  Pay special attention to the area where your surgery is being performed.  If you are having back surgery, having someone wash your back for you may be helpful. Wait 2 minutes after CHG soap is applied, then you may rinse off the CHG soap.  Pat dry with a clean towel  Put on clean clothes/pajamas   If you choose to wear lotion, please use ONLY the CHG-compatible lotions on the back of this paper.     Additional instructions for the day of surgery: DO NOT APPLY any lotions, deodorants, cologne, or perfumes.   Put on clean/comfortable clothes.  Brush your teeth.  Ask your nurse before applying any prescription medications to the skin.      CHG Compatible Lotions   Aveeno Moisturizing lotion  Cetaphil Moisturizing Cream  Cetaphil Moisturizing Lotion  Clairol Herbal Essence Moisturizing Lotion, Dry Skin  Clairol Herbal Essence Moisturizing Lotion, Extra Dry Skin  Clairol Herbal Essence Moisturizing Lotion, Normal Skin  Curel Age Defying Therapeutic Moisturizing Lotion with Alpha Hydroxy  Curel Extreme Care Body Lotion  Curel Soothing Hands Moisturizing Hand Lotion  Curel Therapeutic Moisturizing Cream, Fragrance-Free  Curel Therapeutic Moisturizing Lotion, Fragrance-Free  Curel Therapeutic Moisturizing Lotion, Original Formula  Eucerin Daily Replenishing Lotion  Eucerin Dry Skin Therapy Plus Alpha Hydroxy Crme  Eucerin Dry Skin Therapy Plus Alpha Hydroxy Lotion  Eucerin Original Crme  Eucerin Original Lotion  Eucerin Plus Crme Eucerin Plus Lotion  Eucerin TriLipid Replenishing Lotion  Keri Anti-Bacterial Hand Lotion  Keri Deep Conditioning Original Lotion Dry  Skin Formula Softly Scented  Keri Deep Conditioning Original Lotion, Fragrance Free Sensitive Skin Formula  Keri Lotion Fast Absorbing Fragrance Free Sensitive Skin Formula  Keri Lotion Fast Absorbing Softly Scented Dry Skin Formula  Keri Original Lotion  Keri Skin Renewal Lotion Keri Silky Smooth Lotion  Keri Silky Smooth Sensitive Skin Lotion  Nivea Body Creamy Conditioning Oil  Nivea Body Extra Enriched Lotion  Nivea Body Original Lotion  Nivea Body Sheer Moisturizing Lotion Nivea Crme  Nivea Skin Firming Lotion  NutraDerm 30 Skin Lotion  NutraDerm Skin Lotion  NutraDerm Therapeutic Skin Cream  NutraDerm Therapeutic Skin Lotion  ProShield Protective Hand Cream  Provon moisturizing lotion   PATIENT SIGNATURE_________________________________  NURSE SIGNATURE__________________________________  ________________________________________________________________________    Virginia Russell  An incentive spirometer is a tool that can help keep your lungs clear and active. This tool measures how well you are filling your lungs with each breath. Taking long deep breaths may help reverse or decrease the chance of developing breathing (pulmonary) problems (especially infection) following: A long period of time when you are unable to move or be active. BEFORE THE PROCEDURE  If the spirometer includes an indicator to show your best effort, your nurse or respiratory therapist will set it to a desired goal. If possible, sit up straight or lean slightly forward. Try not to slouch. Hold the incentive spirometer in an upright position. INSTRUCTIONS FOR USE  Sit on the edge of your bed if possible, or sit up as far as you can in bed or on a chair. Hold the incentive spirometer in an upright position. Breathe out normally. Place the mouthpiece in your mouth and seal your lips tightly around it. Breathe in slowly and as deeply as possible, raising the piston or the ball toward the top of the  column. Hold your breath for 3-5 seconds or for as long as possible. Allow the piston or ball to fall to the bottom of the column. Remove the mouthpiece from your mouth and breathe out normally. Rest for a few seconds and repeat Steps 1 through 7 at least 10 times every 1-2 hours when you are awake. Take your time and take a few normal breaths between deep breaths. The spirometer may include an indicator to show your best effort. Use the indicator as a goal to work toward during each repetition. After each set of 10 deep breaths, practice coughing to be sure your lungs are clear. If you have an incision (the cut made at the time of surgery), support your incision when coughing by placing a pillow or rolled  up towels firmly against it. Once you are able to get out of bed, walk around indoors and cough well. You may stop using the incentive spirometer when instructed by your caregiver.  RISKS AND COMPLICATIONS Take your time so you do not get dizzy or light-headed. If you are in pain, you may need to take or ask for pain medication before doing incentive spirometry. It is harder to take a deep breath if you are having pain. AFTER USE Rest and breathe slowly and easily. It can be helpful to keep track of a log of your progress. Your caregiver can provide you with a simple table to help with this. If you are using the spirometer at home, follow these instructions: SEEK MEDICAL CARE IF:  You are having difficultly using the spirometer. You have trouble using the spirometer as often as instructed. Your pain medication is not giving enough relief while using the spirometer. You develop fever of 100.5 F (38.1 C) or higher. SEEK IMMEDIATE MEDICAL CARE IF:  You cough up bloody sputum that had not been present before. You develop fever of 102 F (38.9 C) or greater. You develop worsening pain at or near the incision site. MAKE SURE YOU:  Understand these instructions. Will watch your  condition. Will get help right away if you are not doing well or get worse. Document Released: 08/02/2006 Document Revised: 06/14/2011 Document Reviewed: 10/03/2006 [facility] Patient Information 2014 [place], [place].   ________________________________________________________________________

## 2023-09-23 NOTE — Progress Notes (Signed)
 Date of COVID positive in last 90 days:  PCP - Catheryn Slocumb, MD Cardiologist - Lynwood Schilling, MD  Chest x-ray -  EKG - 09-01-23 Epic Stress Test - 02-26-20 Epic ECHO - 09-07-23 Epic Cardiac Cath -  Pacemaker/ICD device last checked: Spinal Cord Stimulator:  Bowel Prep -   Sleep Study -  CPAP -   Fasting Blood Sugar -  Checks Blood Sugar _____ times a day  Last dose of GLP1 agonist-  N/A GLP1 instructions:  Do not take after     Last dose of SGLT-2 inhibitors-  N/A SGLT-2 instructions:  Do not take after     Blood Thinner Instructions:  Last dose:   Time: Aspirin Instructions: Last Dose:  Activity level:  Can go up a flight of stairs and perform activities of daily living without stopping and without symptoms of chest pain or shortness of breath.  Able to exercise without symptoms  Unable to go up a flight of stairs without symptoms of     Anesthesia review: Mild aortic stenosis, carotid stenosis, HTN, DM  Patient denies shortness of breath, fever, cough and chest pain at PAT appointment  Patient verbalized understanding of instructions that were given to them at the PAT appointment. Patient was also instructed that they will need to review over the PAT instructions again at home before surgery.

## 2023-09-24 DIAGNOSIS — R21 Rash and other nonspecific skin eruption: Secondary | ICD-10-CM | POA: Diagnosis not present

## 2023-09-24 DIAGNOSIS — E119 Type 2 diabetes mellitus without complications: Secondary | ICD-10-CM | POA: Diagnosis not present

## 2023-09-24 DIAGNOSIS — M1710 Unilateral primary osteoarthritis, unspecified knee: Secondary | ICD-10-CM | POA: Diagnosis not present

## 2023-09-26 NOTE — H&P (Signed)
 KNEE ARTHROPLASTY ADMISSION H&P  Patient ID: Virginia Russell [facility] MRN: [id number] DOB/AGE: 06-Jun-1945 78 y.o.  Chief Complaint: right knee pain.  Planned Procedure Date: 10/04/23 Medical Clearance by Dr. Catheryn Slocumb   Cardiac Clearance by Dr. Maude Emmer   HPI: Virginia Russell is a 78 y.o. female who presents for evaluation of OA RIGHT KNEE. The patient has a history of pain and functional disability in the right knee due to arthritis and has failed non-surgical conservative treatments for greater than 12 weeks to include NSAID's and/or analgesics, corticosteriod injections, use of assistive devices, and activity modification.  Onset of symptoms was gradual, starting 2 years ago with gradually worsening course since that time. The patient noted no past surgery on the right knee.  Patient currently rates pain at 10 out of 10 with activity. Patient has night pain, worsening of pain with activity and weight bearing, and pain that interferes with activities of daily living.  Patient has evidence of subchondral sclerosis, periarticular osteophytes, and joint space narrowing by imaging studies.  There is no active infection.  Past Medical History:  Diagnosis Date   Anxiety    Aortic stenosis, mild 02/11/2020   Mean gradient 9 mmHg on 11/2019   Carotid stenosis, bilateral 02/11/2020   Diabetes mellitus without complication (HCC)    High cholesterol    HTN (hypertension)    Hyperlipidemia LDL goal <70 02/11/2020   Past Surgical History:  Procedure Laterality Date   ABDOMINAL HYSTERECTOMY     ABDOMINAL HYSTERECTOMY   BACK SURGERY     BREAST BIOPSY Left    CATARACT EXTRACTION, BILATERAL Right 04/15/2022   R performed on 04/29/22   NECK SURGERY     SHOULDER SURGERY     X2   TRANSESOPHAGEAL ECHOCARDIOGRAM (CATH LAB) N/A 09/07/2023   Procedure: TRANSESOPHAGEAL ECHOCARDIOGRAM;  Surgeon: Emmer Maude BROCKS, MD;  Location: [facility];  Service: Cardiovascular;  Laterality: N/A;    Allergies  Allergen Reactions   Codeine Nausea And Vomiting   Prior to Admission medications   Medication Sig Start Date End Date Taking? Authorizing Provider  amLODipine  (NORVASC ) 5 MG tablet TAKE 1 TABLET BY MOUTH EVERY EVENING 10/28/22  Yes Slocumb Catheryn, MD  aspirin 81 MG tablet Take 81 mg by mouth daily.   Yes [provider]  atorvastatin  (LIPITOR) 40 MG tablet TAKE 1 TABLET EVERY NIGHT 05/18/23  Yes Slocumb Catheryn, MD  b complex vitamins capsule Take 1 capsule by mouth daily.   Yes [provider]  celecoxib  (CELEBREX ) 100 MG capsule Take 100 mg by mouth 2 (two) times daily. 04/09/23  Yes [provider]  cycloSPORINE (RESTASIS) 0.05 % ophthalmic emulsion Place 1 drop into both eyes 2 (two) times daily. 06/29/23  Yes [provider]  diclofenac Sodium (VOLTAREN ARTHRITIS PAIN) 1 % GEL Apply 2 g topically daily as needed (Pain).   Yes [provider]  losartan  (COZAAR ) 50 MG tablet Take 1 tablet (50 mg total) by mouth daily. 05/18/23  Yes Slocumb Catheryn, MD  Menthol, Topical Analgesic, (BIOFREEZE COOL THE PAIN LARGE) 5 % PTCH Place 1 patch onto the skin daily as needed (pain.).   Yes [provider]  metFORMIN  (GLUCOPHAGE -XR) 750 MG 24 hr tablet Take 2 tablets (1,500 mg total) by mouth daily after supper. 05/21/23  Yes Slocumb Catheryn, MD  oxymetazoline (AFRIN) 0.05 % nasal spray Place 1 spray into both nostrils at bedtime as needed for congestion.   Yes [provider]  sitaGLIPtin  (JANUVIA ) 100  MG tablet Take 1 tablet (100 mg total) by mouth daily. 06/06/23  Yes Jarold Medici, MD  tiZANidine  (ZANAFLEX ) 4 MG tablet TAKE 1 TABLET (4 MG TOTAL) BY MOUTH DAILY AS NEEDED FOR MUSCLE SPASM 03/08/23  Yes Jarold Medici, MD  venlafaxine  (EFFEXOR ) 37.5 MG tablet TAKE 1 TABLET BY MOUTH EVERY DAY 07/26/23  Yes Jarold Medici, MD  Accu-Chek Softclix Lancets lancets Use as instructed to check blood sugars 1 time per day dx: e11.9 02/14/23    Jarold Medici, MD  Alcohol Swabs  (B-D SINGLE USE SWABS  BUTTERFLY) PADS 100 Pieces by Does not apply route as needed. 10/19/18   Jarold Medici, MD  Blood Glucose Monitoring Suppl (ACCU-CHEK GUIDE) w/Device KIT Use to check blood sugars once daily E11.9 04/14/23   Jarold Medici, MD  glucose blood (ACCU-CHEK AVIVA PLUS) test strip Use as instructed to check blood sugars 1 time per day dx: e11.22 02/22/23   Jarold Medici, MD  glucose blood (ACCU-CHEK GUIDE) test strip Use as instructed to check blood sugars once daily E11.9 02/16/21   Jarold Medici, MD   Social History   Socioeconomic History   Marital status: Married    Spouse name: Not on file   Number of children: 3   Years of education: Not on file   Highest education level: Not on file  Occupational History   Occupation: house cleaner  Tobacco Use   Smoking status: Never   Smokeless tobacco: Never  Vaping Use   Vaping status: Never Used  Substance and Sexual Activity   Alcohol use: No   Drug use: Never   Sexual activity: Not Currently  Other Topics Concern   Not on file  Social History Narrative   Not on file   Social Drivers of Health   Financial Resource Strain: Low Risk  (02/09/2023)   Overall Financial Resource Strain (CARDIA)    Difficulty of Paying Living Expenses: Not hard at all  Food Insecurity: No Food Insecurity (02/09/2023)   Hunger Vital Sign    Worried About Running Out of Food in the Last Year: Never true    Ran Out of Food in the Last Year: Never true  Transportation Needs: No Transportation Needs (02/09/2023)   PRAPARE - Administrator, Civil Service (Medical): No    Lack of Transportation (Non-Medical): No  Physical Activity: Sufficiently Active (02/09/2023)   Exercise Vital Sign    Days of Exercise per Week: 5 days    Minutes of Exercise per Session: 90 min  Stress: No Stress Concern Present (02/09/2023)   [employer] of Occupational Health - Occupational Stress Questionnaire     Feeling of Stress : Not at all  Social Connections: Moderately Integrated (02/09/2023)   Social Connection and Isolation Panel    Frequency of Communication with Friends and Family: More than three times a week    Frequency of Social Gatherings with Friends and Family: Never    Attends Religious Services: More than 4 times per year    Active Member of [employer] or Organizations: No    Attends Engineer, structural: Never    Marital Status: Married   Family History  Problem Relation Age of Onset   Breast cancer Mother    Heart disease Mother    Heart attack Mother    Stroke Father    Heart attack Father    Heart attack Daughter    Heart attack Brother    Heart attack Maternal Grandmother    Heart attack Paternal Grandmother  ROS: Currently denies lightheadedness, dizziness, Fever, chills, CP, SOB.   No personal history of DVT, PE, MI, or CVA. No loose teeth or dentures All other systems have been reviewed and were otherwise currently negative with the exception of those mentioned in the HPI and as above.  Objective: Vitals: Ht: 5'0 Wt: 135 lbs Temp: 97.9 BP: 126/73 Pulse: 87 O2 94% on room air.   Physical Exam: General: Alert, NAD.  Antalgic Gait  HEENT: EOMI, Good Neck Extension  Pulm: No increased work of breathing.  Clear B/L A/P w/o crackle or wheeze.  CV: RRR, No g/r appreciated. There is a harsh high pitched crescendo decrescendo murmur, best heard over the right sternal border, associated with her aortic stenosis.   GI: soft, NT, ND. BS x 4 quadrants Neuro: CN II-XII grossly intact without focal deficit.  Sensation intact distally Skin: No lesions in the area of chief complaint MSK/Surgical Site: + JLT. ROM slow from 0-110 degrees.  5/5 strength in extension and flexion.  +EHL/FHL.  NVI.  Stable varus and valgus stress.    Imaging Review Plain radiographs demonstrate severe degenerative joint disease of the right knee.   The overall alignment isneutral. The  bone quality appears to be fair for age and reported activity level.  Preoperative templating of the joint replacement has been completed, documented, and submitted to the Operating Room personnel in order to optimize intra-operative equipment management.  Assessment: OA RIGHT KNEE Active Problems:   * No active hospital problems. *   Plan: Plan for Procedure(s): ARTHROPLASTY, KNEE, TOTAL  The patient history, physical exam, clinical judgement of the provider and imaging are consistent with end stage degenerative joint disease and total joint arthroplasty is deemed medically necessary. The treatment options including medical management, injection therapy, and arthroplasty were discussed at length. The risks and benefits of Procedure(s): ARTHROPLASTY, KNEE, TOTAL were presented and reviewed.  The risks of nonoperative treatment, versus surgical intervention including but not limited to continued pain, aseptic loosening, stiffness, dislocation/subluxation, infection, bleeding, nerve injury, blood clots, cardiopulmonary complications, morbidity, mortality, among others were discussed. The patient verbalizes understanding and wishes to proceed with the plan.  Patient is being admitted for inpatient treatment for surgery, pain control, PT, prophylactic antibiotics, VTE prophylaxis, progressive ambulation, ADL's and discharge planning.   Dental prophylaxis discussed and recommended for 2 years postoperatively.  The patient does meet the criteria for TXA which will be used perioperatively.   ASA 81 mg BID will be used postoperatively for DVT prophylaxis in addition to SCDs, and early ambulation. Plan for Tylenol, Celebrex , Hydrocodone for pain.   Robaxin for muscle spasms.   Zofran for nausea and vomiting. Miralax is for constipation prevention and has enough at home. Pharmacy- [facility] in [place] The patient is planning to be discharged home with OPPT and into the care of her daughter Zebedee Billing  who can be reached at [phone] Follow up appt 10/18/23 at 2:30pm with Dr. Edna Sayres CHRISTELLA Large, PA-C Office [phone] 09/26/2023 4:24 PM

## 2023-09-27 ENCOUNTER — Other Ambulatory Visit: Payer: Self-pay

## 2023-09-27 ENCOUNTER — Encounter (HOSPITAL_COMMUNITY)
Admission: RE | Admit: 2023-09-27 | Discharge: 2023-09-27 | Disposition: A | Discharge status: Home / Self Care | Source: Ambulatory Visit | Attending: Orthopedic Surgery | Admitting: Orthopedic Surgery

## 2023-09-27 ENCOUNTER — Encounter (HOSPITAL_COMMUNITY): Payer: Self-pay

## 2023-09-27 VITALS — BP 134/76 | HR 79 | Temp 98.3°F | Resp 16 | Ht 61.0 in | Wt 134.2 lb

## 2023-09-27 DIAGNOSIS — E119 Type 2 diabetes mellitus without complications: Secondary | ICD-10-CM | POA: Insufficient documentation

## 2023-09-27 DIAGNOSIS — Z7984 Long term (current) use of oral hypoglycemic drugs: Secondary | ICD-10-CM | POA: Diagnosis not present

## 2023-09-27 DIAGNOSIS — I6523 Occlusion and stenosis of bilateral carotid arteries: Secondary | ICD-10-CM | POA: Insufficient documentation

## 2023-09-27 DIAGNOSIS — I10 Essential (primary) hypertension: Secondary | ICD-10-CM | POA: Diagnosis not present

## 2023-09-27 DIAGNOSIS — Z01812 Encounter for preprocedural laboratory examination: Secondary | ICD-10-CM | POA: Diagnosis not present

## 2023-09-27 DIAGNOSIS — I340 Nonrheumatic mitral (valve) insufficiency: Secondary | ICD-10-CM | POA: Insufficient documentation

## 2023-09-27 DIAGNOSIS — I700 Atherosclerosis of aorta: Secondary | ICD-10-CM | POA: Diagnosis not present

## 2023-09-27 DIAGNOSIS — M1711 Unilateral primary osteoarthritis, right knee: Secondary | ICD-10-CM | POA: Diagnosis not present

## 2023-09-27 DIAGNOSIS — Z01818 Encounter for other preprocedural examination: Secondary | ICD-10-CM

## 2023-09-27 HISTORY — DX: Unspecified osteoarthritis, unspecified site: M19.90

## 2023-09-27 HISTORY — DX: Other specified postprocedural states: Z98.890

## 2023-09-27 HISTORY — DX: Other specified postprocedural states: R11.2

## 2023-09-27 HISTORY — DX: Unspecified malignant neoplasm of skin, unspecified: C44.90

## 2023-09-27 LAB — BASIC METABOLIC PANEL WITH GFR
Anion gap: 9 (ref 5–15)
BUN: 20 mg/dL (ref 8–23)
CO2: 27 mmol/L (ref 22–32)
Calcium: 9.4 mg/dL (ref 8.9–10.3)
Chloride: 104 mmol/L (ref 98–111)
Creatinine, Ser: 0.58 mg/dL (ref 0.44–1.00)
GFR, Estimated: >60 mL/min (ref >60)
Glucose, Bld: 138 mg/dL — ABNORMAL HIGH (ref 70–99)
Potassium: 4.4 mmol/L (ref 3.5–5.1)
Sodium: 140 mmol/L (ref 135–145)

## 2023-09-27 LAB — CBC
HCT: 38.8 % (ref 36.0–46.0)
Hemoglobin: 12.6 g/dL (ref 12.0–15.0)
MCH: 31.7 pg (ref 26.0–34.0)
MCHC: 32.5 g/dL (ref 30.0–36.0)
MCV: 97.7 fL (ref 80.0–100.0)
Platelets: 195 10*3/uL (ref 150–400)
RBC: 3.97 MIL/uL (ref 3.87–5.11)
RDW: 12.5 % (ref 11.5–15.5)
WBC: 8.8 10*3/uL (ref 4.0–10.5)
nRBC: 0 % (ref 0.0–0.2)

## 2023-09-27 LAB — HEMOGLOBIN A1C
Hgb A1c MFr Bld: 7.4 % — ABNORMAL HIGH (ref 4.8–5.6)
Mean Plasma Glucose: 165.68 mg/dL

## 2023-09-27 LAB — SURGICAL PCR SCREEN
MRSA, PCR: NEGATIVE
Staphylococcus aureus: POSITIVE — AB

## 2023-09-27 LAB — GLUCOSE, CAPILLARY: Glucose-Capillary: 157 mg/dL — ABNORMAL HIGH (ref 70–99)

## 2023-09-27 NOTE — Progress Notes (Signed)
PCR results sent to Dr. Murphy to review.   

## 2023-09-28 NOTE — Anesthesia Preprocedure Evaluation (Addendum)
 Anesthesia Evaluation  Patient identified by MRN, date of birth, ID band Patient awake    Reviewed: Allergy & Precautions, NPO status , Patient's Chart, lab work & pertinent test results  History of Anesthesia Complications (+) PONV and history of anesthetic complications  Airway Mallampati: III  TM Distance: >3 FB Neck ROM: Full    Dental  (+) Teeth Intact, Dental Advisory Given   Pulmonary neg pulmonary ROS   Pulmonary exam normal breath sounds clear to auscultation       Cardiovascular hypertension (146/70 preop), Pt. on medications + Peripheral Vascular Disease  Normal cardiovascular exam+ Valvular Problems/Murmurs (mod MR, mild AS) MR and AS  Rhythm:Regular Rate:Normal  Echo 09/07/23: 1. Left ventricular ejection fraction, by estimation, is 60 to 65%. The  left ventricle has normal function.   2. Right ventricular systolic function is normal. The right ventricular  size is normal.   3. Left atrial size was severely dilated. No left atrial/left atrial  appendage thrombus was detected.   4. Right atrial size was moderately dilated.   5. Moderate at most MR. PISA radius only 0.5. 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 mean gradient in diastole 6 mmHg  but no MS by PT1/2 . 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   6. The aortic valve is tricuspid. There is mild calcification of the  aortic valve. There is mild thickening of the aortic valve. Aortic valve  regurgitation is trivial. Mild aortic valve stenosis.   7. Aortic dilatation noted.   8. 3D performed of the mitral valve and 3D performed of the LAA and  demonstrates 3D of MV and LAA perfomed.     Neuro/Psych  PSYCHIATRIC DISORDERS Anxiety Depression       GI/Hepatic negative GI ROS, Neg liver ROS,,,  Endo/Other  diabetes, Well Controlled, Type 2, Oral Hypoglycemic Agents    Renal/GU negative Renal ROS   negative genitourinary   Musculoskeletal  (+) Arthritis , Osteoarthritis,  2 prior lumbar surgeries in 1990s   Abdominal   Peds  Hematology negative hematology ROS (+) Hb 12.6, plt 195   Anesthesia Other Findings   Reproductive/Obstetrics negative OB ROS                             Anesthesia Physical Anesthesia Plan  ASA: 3  Anesthesia Plan: Spinal, MAC and Regional   Post-op Pain Management: Regional block* and Tylenol PO (pre-op)*   Induction:   PONV Risk Score and Plan: 2 and Propofol  infusion and TIVA  Airway Management Planned: Natural Airway and Nasal Cannula  Additional Equipment: None  Intra-op Plan:   Post-operative Plan:   Informed Consent: I have reviewed the patients History and Physical, chart, labs and discussed the procedure including the risks, benefits and alternatives for the proposed anesthesia with the patient or authorized representative who has indicated his/her understanding and acceptance.       Plan Discussed with: CRNA  Anesthesia Plan Comments:        Anesthesia Quick Evaluation

## 2023-09-28 NOTE — Progress Notes (Signed)
 Anesthesia Chart Review   Case: [id number] Date/Time: 10/04/23 0845   Procedure: ARTHROPLASTY, KNEE, TOTAL (Right: Knee)   Anesthesia type: Spinal   Pre-op diagnosis: OA RIGHT KNEE   Location: [facility] ROOM 08 / [facility]   Surgeons: Beverley Evalene BIRCH, MD       DISCUSSION:78 y.o. never smoker with h/o HTN, PONV, mild carotid stenosis, mild aortic stenosis, moderate mitral regurgitation, DM II, right knee OA scheduled for above procedure 10/04/2023 with Dr. Evalene Beverley.   Pt seen by cardiology 09/01/23. Per OV note, Preop clearance will be based on the results of the TEE. I suspect we will let her have this elective surgery but I like to do the valve evaluation first.  Per 09/07/23 Echo results comments, The MR was moderate.  No change in therapy.  She can follow up with me in six months.  It is OK for her to have the planned surgery.  Please use this note as the preop clearance.  VS: BP 134/76   Pulse 79   Temp 36.8 C (Oral)   Resp 16   Ht 5' 1 (1.549 m)   Wt 60.9 kg   SpO2 99%   BMI 25.36 kg/m   PROVIDERS: Jarold Medici, MD is PCP   Cardiologist - Lynwood Schilling, MD   LABS: Labs reviewed: Acceptable for surgery. (all labs ordered are listed, but only abnormal results are displayed)  Labs Reviewed  SURGICAL PCR SCREEN - Abnormal; Notable for the following components:      Result Value   Staphylococcus aureus POSITIVE (*)    All other components within normal limits  HEMOGLOBIN A1C - Abnormal; Notable for the following components:   Hgb A1c MFr Bld 7.4 (*)    All other components within normal limits  BASIC METABOLIC PANEL WITH GFR - Abnormal; Notable for the following components:   Glucose, Bld 138 (*)    All other components within normal limits  GLUCOSE, CAPILLARY - Abnormal; Notable for the following components:   Glucose-Capillary 157 (*)    All other components within normal limits  CBC     IMAGES: VAS US  Carotid 03/10/2021 Summary:  Right Carotid: Velocities  in the right ICA are consistent with a 1-39%  stenosis.                Non-hemodynamically significant plaque <50% noted in the  CCA.   Left Carotid: Velocities in the left ICA are consistent with a 1-39%  stenosis.               Non-hemodynamically significant plaque <50% noted in the  CCA.   EKG:   CV: Echo 09/07/2023  1. Left ventricular ejection fraction, by estimation, is 60 to 65%. The  left ventricle has normal function.   2. Right ventricular systolic function is normal. The right ventricular  size is normal.   3. Left atrial size was severely dilated. No left atrial/left atrial  appendage thrombus was detected.   4. Right atrial size was moderately dilated.   5. Moderate at most MR. PISA radius only 0.5. 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 mean gradient in diastole 6 mmHg  but no MS by PT1/2 . 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   6. The aortic valve is tricuspid. There is mild calcification of the  aortic valve. There is mild thickening of the aortic valve. Aortic valve  regurgitation is trivial. Mild aortic valve stenosis.   7.  Aortic dilatation noted.   8. 3D performed of the mitral valve and 3D performed of the LAA and  demonstrates 3D of MV and LAA perfomed.   Myocardial Perfusion 02/26/2020 Nuclear stress EF: 60%. The left ventricular ejection fraction is normal (55-65%). There was no ST segment deviation noted during stress. Defect 1: There is a small defect of mild severity present in the apex location. This is a low risk study.   No evidence of ischemia, though there is a small fixed defect at the apex. There is normal wall motion in this area, so defect most consistent with artifact. Occasional PVCs seen during test. Past Medical History:  Diagnosis Date   Anxiety    Aortic stenosis, mild 02/11/2020   Mean gradient 9 mmHg on 11/2019   Arthritis    Carotid stenosis, bilateral 02/11/2020    Diabetes mellitus without complication (HCC)    High cholesterol    HTN (hypertension)    Hyperlipidemia LDL goal <70 02/11/2020   PONV (postoperative nausea and vomiting)    Skin cancer     Past Surgical History:  Procedure Laterality Date   ABDOMINAL HYSTERECTOMY     ABDOMINAL HYSTERECTOMY   BACK SURGERY     BREAST BIOPSY Left    CATARACT EXTRACTION, BILATERAL Right 04/15/2022   R performed on 04/29/22   NECK SURGERY     SHOULDER SURGERY     X2   TRANSESOPHAGEAL ECHOCARDIOGRAM (CATH LAB) N/A 09/07/2023   Procedure: TRANSESOPHAGEAL ECHOCARDIOGRAM;  Surgeon: Delford Maude BROCKS, MD;  Location: [facility];  Service: Cardiovascular;  Laterality: N/A;    MEDICATIONS:  hydrOXYzine (ATARAX) 25 MG tablet   Accu-Chek Softclix Lancets lancets   Alcohol Swabs  (B-D SINGLE USE SWABS  BUTTERFLY) PADS   amLODipine  (NORVASC ) 5 MG tablet   aspirin 81 MG tablet   atorvastatin  (LIPITOR) 40 MG tablet   b complex vitamins capsule   Blood Glucose Monitoring Suppl (ACCU-CHEK GUIDE) w/Device KIT   celecoxib  (CELEBREX ) 100 MG capsule   cycloSPORINE (RESTASIS) 0.05 % ophthalmic emulsion   diclofenac Sodium (VOLTAREN ARTHRITIS PAIN) 1 % GEL   glucose blood (ACCU-CHEK AVIVA PLUS) test strip   glucose blood (ACCU-CHEK GUIDE) test strip   losartan  (COZAAR ) 50 MG tablet   Menthol, Topical Analgesic, (BIOFREEZE COOL THE PAIN LARGE) 5 % PTCH   metFORMIN  (GLUCOPHAGE -XR) 750 MG 24 hr tablet   oxymetazoline (AFRIN) 0.05 % nasal spray   sitaGLIPtin  (JANUVIA ) 100 MG tablet   tiZANidine  (ZANAFLEX ) 4 MG tablet   venlafaxine  (EFFEXOR ) 37.5 MG tablet   No current facility-administered medications for this encounter.     Harlene Hoots Ward, PA-C [facility] [phone]

## 2023-10-03 ENCOUNTER — Other Ambulatory Visit: Payer: Self-pay | Admitting: Internal Medicine

## 2023-10-04 ENCOUNTER — Ambulatory Visit (HOSPITAL_COMMUNITY)

## 2023-10-04 ENCOUNTER — Encounter (HOSPITAL_COMMUNITY): Payer: Self-pay | Admitting: Orthopedic Surgery

## 2023-10-04 ENCOUNTER — Ambulatory Visit (HOSPITAL_BASED_OUTPATIENT_CLINIC_OR_DEPARTMENT_OTHER): Payer: Self-pay | Admitting: Anesthesiology

## 2023-10-04 ENCOUNTER — Encounter (HOSPITAL_COMMUNITY)
Admission: RE | Disposition: A | Discharge status: Home / Self Care | Payer: Self-pay | Source: Home / Self Care | Attending: Orthopedic Surgery

## 2023-10-04 ENCOUNTER — Other Ambulatory Visit: Payer: Self-pay

## 2023-10-04 ENCOUNTER — Ambulatory Visit (HOSPITAL_COMMUNITY): Payer: Self-pay | Admitting: Physician Assistant

## 2023-10-04 ENCOUNTER — Ambulatory Visit (HOSPITAL_COMMUNITY)
Admission: RE | Admit: 2023-10-04 | Discharge: 2023-10-04 | Disposition: A | Discharge status: Home / Self Care | Attending: Orthopedic Surgery | Admitting: Orthopedic Surgery

## 2023-10-04 DIAGNOSIS — I10 Essential (primary) hypertension: Secondary | ICD-10-CM | POA: Diagnosis not present

## 2023-10-04 DIAGNOSIS — F419 Anxiety disorder, unspecified: Secondary | ICD-10-CM | POA: Diagnosis not present

## 2023-10-04 DIAGNOSIS — M1990 Unspecified osteoarthritis, unspecified site: Secondary | ICD-10-CM | POA: Insufficient documentation

## 2023-10-04 DIAGNOSIS — I350 Nonrheumatic aortic (valve) stenosis: Secondary | ICD-10-CM | POA: Diagnosis not present

## 2023-10-04 DIAGNOSIS — M25761 Osteophyte, right knee: Secondary | ICD-10-CM | POA: Diagnosis not present

## 2023-10-04 DIAGNOSIS — Z96651 Presence of right artificial knee joint: Secondary | ICD-10-CM

## 2023-10-04 DIAGNOSIS — Z79899 Other long term (current) drug therapy: Secondary | ICD-10-CM | POA: Diagnosis not present

## 2023-10-04 DIAGNOSIS — E1151 Type 2 diabetes mellitus with diabetic peripheral angiopathy without gangrene: Secondary | ICD-10-CM | POA: Insufficient documentation

## 2023-10-04 DIAGNOSIS — G8918 Other acute postprocedural pain: Secondary | ICD-10-CM | POA: Diagnosis not present

## 2023-10-04 DIAGNOSIS — M1711 Unilateral primary osteoarthritis, right knee: Secondary | ICD-10-CM | POA: Insufficient documentation

## 2023-10-04 DIAGNOSIS — F32A Depression, unspecified: Secondary | ICD-10-CM | POA: Diagnosis not present

## 2023-10-04 DIAGNOSIS — I051 Rheumatic mitral insufficiency: Secondary | ICD-10-CM | POA: Insufficient documentation

## 2023-10-04 DIAGNOSIS — I6523 Occlusion and stenosis of bilateral carotid arteries: Secondary | ICD-10-CM

## 2023-10-04 DIAGNOSIS — E119 Type 2 diabetes mellitus without complications: Secondary | ICD-10-CM

## 2023-10-04 DIAGNOSIS — I080 Rheumatic disorders of both mitral and aortic valves: Secondary | ICD-10-CM

## 2023-10-04 DIAGNOSIS — Z7984 Long term (current) use of oral hypoglycemic drugs: Secondary | ICD-10-CM | POA: Diagnosis not present

## 2023-10-04 DIAGNOSIS — Z471 Aftercare following joint replacement surgery: Secondary | ICD-10-CM | POA: Diagnosis not present

## 2023-10-04 HISTORY — PX: TOTAL KNEE ARTHROPLASTY: SHX125

## 2023-10-04 LAB — GLUCOSE, CAPILLARY
Glucose-Capillary: 144 mg/dL — ABNORMAL HIGH (ref 70–99)
Glucose-Capillary: 180 mg/dL — ABNORMAL HIGH (ref 70–99)

## 2023-10-04 SURGERY — ARTHROPLASTY, KNEE, TOTAL
Anesthesia: Monitor Anesthesia Care | Site: Knee | Laterality: Right

## 2023-10-04 MED ORDER — LACTATED RINGERS IV SOLN: INTRAVENOUS | Status: DC | PRN

## 2023-10-04 MED ORDER — MUPIROCIN 2 % EX OINT: 1.0000 | TOPICAL_OINTMENT | Freq: Two times a day (BID) | CUTANEOUS | 0 refills | Status: AC

## 2023-10-04 MED ORDER — METHOCARBAMOL 500 MG PO TABS
ORAL_TABLET | ORAL | Status: AC
  Filled 2023-10-04: qty 1

## 2023-10-04 MED ORDER — LACTATED RINGERS IV BOLUS
500.0000 mL | Freq: Once | INTRAVENOUS | Status: AC
  Administered 2023-10-04: 500 mL via INTRAVENOUS

## 2023-10-04 MED ORDER — SODIUM CHLORIDE 0.9 % IV SOLN
INTRAVENOUS | Status: DC | PRN
  Administered 2023-10-04: 80 mL

## 2023-10-04 MED ORDER — METHOCARBAMOL 500 MG PO TABS
500.0000 mg | ORAL_TABLET | Freq: Four times a day (QID) | ORAL | Status: DC | PRN
  Administered 2023-10-04: 500 mg via ORAL

## 2023-10-04 MED ORDER — MIDAZOLAM HCL 2 MG/2ML IJ SOLN: 1.0000 mg | INTRAMUSCULAR | Status: DC

## 2023-10-04 MED ORDER — CHLORHEXIDINE GLUCONATE 0.12 % MT SOLN
15.0000 mL | Freq: Once | OROMUCOSAL | Status: AC
  Administered 2023-10-04: 15 mL via OROMUCOSAL

## 2023-10-04 MED ORDER — ORAL CARE MOUTH RINSE: 15.0000 mL | Freq: Once | OROMUCOSAL | Status: AC

## 2023-10-04 MED ORDER — ONDANSETRON HCL 4 MG/2ML IJ SOLN: 4.0000 mg | Freq: Once | INTRAMUSCULAR | Status: DC | PRN

## 2023-10-04 MED ORDER — TRANEXAMIC ACID-NACL 1000-0.7 MG/100ML-% IV SOLN
1000.0000 mg | INTRAVENOUS | Status: AC
  Administered 2023-10-04: 1000 mg via INTRAVENOUS
  Filled 2023-10-04: qty 100

## 2023-10-04 MED ORDER — HYDROCODONE-ACETAMINOPHEN 10-325 MG PO TABS: 1.0000 | ORAL_TABLET | ORAL | 0 refills | Status: DC | PRN

## 2023-10-04 MED ORDER — TRANEXAMIC ACID-NACL 1000-0.7 MG/100ML-% IV SOLN
1000.0000 mg | Freq: Once | INTRAVENOUS | Status: AC
  Administered 2023-10-04: 1000 mg via INTRAVENOUS

## 2023-10-04 MED ORDER — DEXAMETHASONE SODIUM PHOSPHATE 10 MG/ML IJ SOLN
INTRAMUSCULAR | Status: AC
  Filled 2023-10-04: qty 1

## 2023-10-04 MED ORDER — AMISULPRIDE (ANTIEMETIC) 5 MG/2ML IV SOLN
INTRAVENOUS | Status: AC
  Filled 2023-10-04: qty 2

## 2023-10-04 MED ORDER — SODIUM CHLORIDE 0.9 % IR SOLN
Status: DC | PRN
  Administered 2023-10-04: 1000 mL

## 2023-10-04 MED ORDER — ONDANSETRON HCL 4 MG/2ML IJ SOLN
INTRAMUSCULAR | Status: DC | PRN
  Administered 2023-10-04: 4 mg via INTRAVENOUS

## 2023-10-04 MED ORDER — FENTANYL CITRATE PF 50 MCG/ML IJ SOSY
50.0000 ug | PREFILLED_SYRINGE | INTRAMUSCULAR | Status: DC
  Administered 2023-10-04: 50 ug via INTRAVENOUS
  Filled 2023-10-04: qty 2

## 2023-10-04 MED ORDER — 0.9 % SODIUM CHLORIDE (POUR BTL) OPTIME
TOPICAL | Status: DC | PRN
Start: 2023-10-04 — End: 2023-10-04
  Administered 2023-10-04: 1000 mL

## 2023-10-04 MED ORDER — DEXAMETHASONE SODIUM PHOSPHATE 10 MG/ML IJ SOLN
8.0000 mg | Freq: Once | INTRAMUSCULAR | Status: AC
  Administered 2023-10-04: 8 mg via INTRAVENOUS

## 2023-10-04 MED ORDER — POVIDONE-IODINE 10 % EX SWAB: 2.0000 | Freq: Once | CUTANEOUS | Status: DC

## 2023-10-04 MED ORDER — LIDOCAINE 2% (20 MG/ML) 5 ML SYRINGE
INTRAMUSCULAR | Status: DC | PRN
  Administered 2023-10-04: 80 mg via INTRAVENOUS

## 2023-10-04 MED ORDER — CHLORHEXIDINE GLUCONATE 4 % EX SOLN
1.0000 | CUTANEOUS | 1 refills | Status: AC
Start: 2023-10-04 — End: ?

## 2023-10-04 MED ORDER — CEFAZOLIN SODIUM-DEXTROSE 2-4 GM/100ML-% IV SOLN: 2.0000 g | Freq: Four times a day (QID) | INTRAVENOUS | Status: DC

## 2023-10-04 MED ORDER — ONDANSETRON HCL 4 MG/2ML IJ SOLN
INTRAMUSCULAR | Status: AC
  Filled 2023-10-04: qty 2

## 2023-10-04 MED ORDER — ONDANSETRON 4 MG PO TBDP: 4.0000 mg | ORAL_TABLET | Freq: Three times a day (TID) | ORAL | 0 refills | Status: DC | PRN

## 2023-10-04 MED ORDER — ACETAMINOPHEN 500 MG PO TABS
1000.0000 mg | ORAL_TABLET | Freq: Once | ORAL | Status: DC
Start: 2023-10-04 — End: 2023-10-04

## 2023-10-04 MED ORDER — LACTATED RINGERS IV BOLUS
250.0000 mL | Freq: Once | INTRAVENOUS | Status: AC
  Administered 2023-10-04: 250 mL via INTRAVENOUS

## 2023-10-04 MED ORDER — BUPIVACAINE IN DEXTROSE 0.75-8.25 % IT SOLN
INTRATHECAL | Status: DC | PRN
  Administered 2023-10-04: 1.6 mL via INTRATHECAL

## 2023-10-04 MED ORDER — OXYCODONE HCL 5 MG PO TABS: 5.0000 mg | ORAL_TABLET | Freq: Once | ORAL | Status: DC | PRN

## 2023-10-04 MED ORDER — METHOCARBAMOL 750 MG PO TABS: 750.0000 mg | ORAL_TABLET | Freq: Three times a day (TID) | ORAL | 0 refills | Status: DC | PRN

## 2023-10-04 MED ORDER — PROPOFOL 10 MG/ML IV BOLUS
INTRAVENOUS | Status: AC
  Filled 2023-10-04: qty 20

## 2023-10-04 MED ORDER — BUPIVACAINE LIPOSOME 1.3 % IJ SUSP: 20.0000 mL | Freq: Once | INTRAMUSCULAR | Status: DC

## 2023-10-04 MED ORDER — CEFAZOLIN SODIUM-DEXTROSE 2-4 GM/100ML-% IV SOLN
2.0000 g | INTRAVENOUS | Status: AC
  Administered 2023-10-04: 2 g via INTRAVENOUS
  Filled 2023-10-04: qty 100

## 2023-10-04 MED ORDER — METHOCARBAMOL 1000 MG/10ML IJ SOLN: 500.0000 mg | Freq: Four times a day (QID) | INTRAMUSCULAR | Status: DC | PRN

## 2023-10-04 MED ORDER — DEXAMETHASONE SODIUM PHOSPHATE 10 MG/ML IJ SOLN
INTRAMUSCULAR | Status: DC | PRN
  Administered 2023-10-04: 10 mg via INTRAVENOUS

## 2023-10-04 MED ORDER — BUPIVACAINE LIPOSOME 1.3 % IJ SUSP
INTRAMUSCULAR | Status: AC
  Filled 2023-10-04: qty 20

## 2023-10-04 MED ORDER — SODIUM CHLORIDE (PF) 0.9 % IJ SOLN
INTRAMUSCULAR | Status: AC
  Filled 2023-10-04: qty 30

## 2023-10-04 MED ORDER — OXYCODONE HCL 5 MG/5ML PO SOLN: 5.0000 mg | Freq: Once | ORAL | Status: DC | PRN

## 2023-10-04 MED ORDER — BUPIVACAINE-EPINEPHRINE (PF) 0.25% -1:200000 IJ SOLN
INTRAMUSCULAR | Status: AC
  Filled 2023-10-04: qty 30

## 2023-10-04 MED ORDER — PROPOFOL 500 MG/50ML IV EMUL
INTRAVENOUS | Status: DC | PRN
  Administered 2023-10-04: 50 ug/kg/min via INTRAVENOUS

## 2023-10-04 MED ORDER — ROPIVACAINE HCL 5 MG/ML IJ SOLN
INTRAMUSCULAR | Status: DC | PRN
  Administered 2023-10-04: 30 mL via PERINEURAL

## 2023-10-04 MED ORDER — ACETAMINOPHEN 500 MG PO TABS
1000.0000 mg | ORAL_TABLET | Freq: Once | ORAL | Status: AC
  Administered 2023-10-04: 1000 mg via ORAL
  Filled 2023-10-04: qty 2

## 2023-10-04 MED ORDER — LACTATED RINGERS IV SOLN
INTRAVENOUS | Status: DC
Start: 2023-10-04 — End: 2023-10-04

## 2023-10-04 MED ORDER — HYDROMORPHONE HCL 1 MG/ML IJ SOLN: 0.2500 mg | INTRAMUSCULAR | Status: DC | PRN

## 2023-10-04 MED ORDER — INSULIN ASPART 100 UNIT/ML IJ SOLN: 0.0000 [IU] | INTRAMUSCULAR | Status: DC | PRN

## 2023-10-04 MED ORDER — CEFAZOLIN SODIUM-DEXTROSE 2-4 GM/100ML-% IV SOLN
INTRAVENOUS | Status: AC
Start: 2023-10-04 — End: 2023-10-04
  Filled 2023-10-04: qty 100

## 2023-10-04 MED ORDER — TRANEXAMIC ACID-NACL 1000-0.7 MG/100ML-% IV SOLN
INTRAVENOUS | Status: AC
Start: 2023-10-04 — End: 2023-10-04
  Filled 2023-10-04: qty 100

## 2023-10-04 MED ORDER — AMISULPRIDE (ANTIEMETIC) 5 MG/2ML IV SOLN
10.0000 mg | Freq: Once | INTRAVENOUS | Status: AC | PRN
  Administered 2023-10-04: 10 mg via INTRAVENOUS

## 2023-10-04 MED ORDER — ASPIRIN 81 MG PO TBEC: 81.0000 mg | DELAYED_RELEASE_TABLET | Freq: Two times a day (BID) | ORAL | 0 refills | Status: AC

## 2023-10-04 SURGICAL SUPPLY — 45 items
BAG COUNTER SPONGE SURGICOUNT (BAG) IMPLANT
BLADE SAG 18X100X1.27 (BLADE) ×1 IMPLANT
BLADE SAGITTAL 25.0X1.37X90 (BLADE) ×1 IMPLANT
BLADE SURG 15 STRL LF DISP TIS (BLADE) ×1 IMPLANT
BNDG ELASTIC 6X10 VLCR STRL LF (GAUZE/BANDAGES/DRESSINGS) ×1 IMPLANT
BOWL SMART MIX CTS (DISPOSABLE) IMPLANT
CLSR STERI-STRIP ANTIMIC 1/2X4 (GAUZE/BANDAGES/DRESSINGS) ×2 IMPLANT
COMPONENT TRI CR RETAIN KNEE (Orthopedic Implant) IMPLANT
COVER SURGICAL LIGHT HANDLE (MISCELLANEOUS) ×1 IMPLANT
CUFF TRNQT CYL 34X4.125X (TOURNIQUET CUFF) ×1 IMPLANT
DRAPE U-SHAPE 47X51 STRL (DRAPES) ×1 IMPLANT
DRSG MEPILEX POST OP 4X12 (GAUZE/BANDAGES/DRESSINGS) ×1 IMPLANT
DURAPREP 26ML APPLICATOR (WOUND CARE) ×2 IMPLANT
ELECT PENCIL ROCKER SW 15FT (MISCELLANEOUS) ×1 IMPLANT
ELECT REM PT RETURN 15FT ADLT (MISCELLANEOUS) ×1 IMPLANT
GLOVE BIO SURGEON STRL SZ7.5 (GLOVE) ×1 IMPLANT
GLOVE BIOGEL PI IND STRL 7.5 (GLOVE) ×1 IMPLANT
GLOVE BIOGEL PI IND STRL 8 (GLOVE) ×1 IMPLANT
GLOVE SURG SYN 7.0 (GLOVE) ×1 IMPLANT
GLOVE SURG SYN 7.0 PF PI (GLOVE) ×1 IMPLANT
GOWN STRL REUS W/ TWL LRG LVL3 (GOWN DISPOSABLE) ×1 IMPLANT
GOWN STRL REUS W/ TWL XL LVL3 (GOWN DISPOSABLE) ×1 IMPLANT
HOLDER FOLEY CATH W/STRAP (MISCELLANEOUS) IMPLANT
IMMOBILIZER KNEE 20 THIGH 36 (SOFTGOODS) IMPLANT
IMMOBILIZER KNEE 22 UNIV (SOFTGOODS) IMPLANT
INSERT TIB CS TRIATH X3 9 (Insert) IMPLANT
KIT TURNOVER KIT A (KITS) ×1 IMPLANT
KNEE PATELLA ASYMMETRIC 9X29 (Knees) IMPLANT
KNEE TIBIAL COMPONENT SZ3 (Knees) IMPLANT
MANIFOLD NEPTUNE II (INSTRUMENTS) ×1 IMPLANT
NS IRRIG 1000ML POUR BTL (IV SOLUTION) ×1 IMPLANT
PACK TOTAL KNEE CUSTOM (KITS) ×1 IMPLANT
PIN FLUTED HEDLESS FIX 3.5X1/8 (PIN) IMPLANT
PROTECTOR NERVE ULNAR (MISCELLANEOUS) ×1 IMPLANT
SET HNDPC FAN SPRY TIP SCT (DISPOSABLE) ×1 IMPLANT
SPIKE FLUID TRANSFER (MISCELLANEOUS) ×1 IMPLANT
SUT MNCRL AB 3-0 PS2 18 (SUTURE) ×1 IMPLANT
SUT VIC AB 0 CT1 36 (SUTURE) ×2 IMPLANT
SUT VIC AB 1 CT1 36 (SUTURE) ×1 IMPLANT
SUT VIC AB 2-0 CT1 TAPERPNT 27 (SUTURE) ×1 IMPLANT
TOWEL GREEN STERILE FF (TOWEL DISPOSABLE) ×1 IMPLANT
TRAY FOLEY MTR SLVR 14FR STAT (SET/KITS/TRAYS/PACK) IMPLANT
TRAY FOLEY MTR SLVR 16FR STAT (SET/KITS/TRAYS/PACK) IMPLANT
TUBE SUCTION HIGH CAP CLEAR NV (SUCTIONS) ×1 IMPLANT
WRAP KNEE MAXI GEL POST OP (GAUZE/BANDAGES/DRESSINGS) ×1 IMPLANT

## 2023-10-04 NOTE — Evaluation (Signed)
 Physical Therapy Evaluation Patient Details Name: Virginia Russell MRN: [id number] DOB: 11-Aug-1945 Today's Date: 10/04/2023  History of Present Illness  78 yo female presents to therapy s/p R TKA on 10/04/2023 due to failure of conservative measures. Pt PMH includes but is not limited to: anxiety, aortic stenosis, carotid stenosis, DM II, HLD, HTN, neck and shoulder surgeries.  Clinical Impression    Virginia Russell is a 78 y.o. female POD 0 s/p R TKA. Patient reports IND with mobility at baseline. Patient is now limited by functional impairments (see PT problem list below). Patient instruction initiated in exercises to facilitate ROM and circulation. PT initiating eval and tx with supine LE TE and pts lunch arrived. PT to return later in afternoon to complete assessment for readiness for same day dc/.Patient will benefit from continued skilled PT interventions to address impairments and progress towards PLOF. Patient has not met mobility goals at adequate level for discharge home with family support and OPPT services scheduled for 7/3; will continue to follow if pt continues acute stay to progress towards Mod I goals.       If plan is discharge home, recommend the following: A little help with walking and/or transfers;A little help with bathing/dressing/bathroom;Assistance with cooking/housework;Assist for transportation;Help with stairs or ramp for entrance   Can travel by private vehicle        Equipment Recommendations Rolling walker (2 wheels)  Recommendations for Other Services       Functional Status Assessment Patient has had a recent decline in their functional status and demonstrates the ability to make significant improvements in function in a reasonable and predictable amount of time.     Precautions / Restrictions Precautions Precautions: Knee;Fall Restrictions Weight Bearing Restrictions Per Provider Order: No      Mobility  Bed Mobility Overal bed mobility:  Needs Assistance                  Transfers Overall transfer level: Needs assistance                      Ambulation/Gait                  Stairs            Wheelchair Mobility     Tilt Bed    Modified Rankin (Stroke Patients Only)       Balance Overall balance assessment: Needs assistance, History of Falls (3-4 in the past 6 months) Sitting-balance support: Feet supported                                         Pertinent Vitals/Pain Pain Assessment Pain Assessment: 0-10 Pain Score: 3  Pain Location: R knee and LE Pain Descriptors / Indicators: Aching, Constant, Discomfort, Dull, Grimacing, Operative site guarding Pain Intervention(s): Limited activity within patient's tolerance, Monitored during session, Premedicated before session, Repositioned, Ice applied    Home Living Family/patient expects to be discharged to:: Private residence Living Arrangements: Spouse/significant other Available Help at Discharge: Family Type of Home: House Home Access: Stairs to enter Entrance Stairs-Rails: None Entrance Stairs-Number of Steps: 4 (or 5 steps from the back of the home with a handrail)   Home Layout: One level Home Equipment: None      Prior Function Prior Level of Function : Independent/Modified Independent;Driving;Working/employed (cleans homes)  Mobility Comments: IND no AD for all ADLs, self  care tasks and IADLs, pt is primary caregiver for husband whom has PD       Extremity/Trunk Assessment        Lower Extremity Assessment Lower Extremity Assessment: RLE deficits/detail    Cervical / Trunk Assessment Cervical / Trunk Assessment: Back Surgery;Neck Surgery  Communication   Communication Communication: No apparent difficulties    Cognition Arousal: Alert Behavior During Therapy: WFL for tasks assessed/performed   PT - Cognitive impairments: No apparent impairments                          Following commands: Intact       Cueing       General Comments      Exercises Total Joint Exercises Ankle Circles/Pumps: AROM, Both, 10 reps Quad Sets: AROM, Right, 5 reps Short Arc Quad: AROM, Right, 5 reps Heel Slides: AROM, Right, 5 reps Hip ABduction/ADduction: AROM, Right, 5 reps Straight Leg Raises: AROM, Right, 5 reps Knee Flexion: AROM, Right, 5 reps, Seated   Assessment/Plan    PT Assessment Patient needs continued PT services  PT Problem List Decreased strength;Decreased range of motion;Decreased activity tolerance;Decreased balance;Decreased mobility;Decreased coordination;Pain       PT Treatment Interventions DME instruction;Gait training;Stair training;Functional mobility training;Therapeutic activities;Therapeutic exercise;Balance training;Neuromuscular re-education;Patient/family education;Modalities    PT Goals (Current goals can be found in the Care Plan section)  Acute Rehab PT Goals Patient Stated Goal: to be able to get up and go, return to work PT Goal Formulation: With patient Time For Goal Achievement: 10/18/23 Potential to Achieve Goals: Good    Frequency 7X/week     Co-evaluation               AM-PAC PT 6 Clicks Mobility  Outcome Measure                  End of Session Equipment Utilized During Treatment: Gait belt Activity Tolerance: Patient tolerated treatment well;No increased pain Patient left: in bed;with call bell/phone within reach;with family/visitor present Nurse Communication: Mobility status;Other (comment) (pt eating lunch) PT Visit Diagnosis: Unsteadiness on feet (R26.81);Other abnormalities of gait and mobility (R26.89);Muscle weakness (generalized) (M62.81);Difficulty in walking, not elsewhere classified (R26.2);History of falling (Z91.81);Pain Pain - Right/Left: Right Pain - part of body: Knee;Leg    Time: 8682-8670 PT Time Calculation (min) (ACUTE ONLY): 12 min   Charges:   PT  Evaluation $PT Eval Low Complexity: 1 Low   PT General Charges $$ ACUTE PT VISIT: 1 Visit         [place], PT Acute Rehab   [place] VEAR Drone 10/04/2023, 1:36 PM

## 2023-10-04 NOTE — Discharge Instructions (Addendum)

## 2023-10-04 NOTE — Transfer of Care (Signed)
 Immediate Anesthesia Transfer of Care Note  Patient: Virginia Russell  Procedure(s) Performed: ARTHROPLASTY, KNEE, TOTAL (Right: Knee)  Patient Location: PACU  Anesthesia Type:MAC and Spinal  Level of Consciousness: awake, alert , oriented, and patient cooperative  Airway & Oxygen Therapy: Patient Spontanous Breathing and Patient connected to face mask oxygen  Post-op Assessment: Report given to RN and Post -op Vital signs reviewed and stable  Post vital signs: Reviewed and stable  Last Vitals:  Vitals Value Taken Time  BP 161/69 10/04/23 10:54  Temp    Pulse 82 10/04/23 10:57  Resp 18 10/04/23 10:57  SpO2 100 % 10/04/23 10:57  Vitals shown include unfiled device data.  Last Pain:  Vitals:   10/04/23 0818  TempSrc:   PainSc: 0-No pain      Patients Stated Pain Goal: 3 (10/04/23 0743)  Complications: No notable events documented.

## 2023-10-04 NOTE — Anesthesia Procedure Notes (Signed)
 Spinal  Patient location during procedure: OR Start time: 10/04/2023 8:59 AM End time: 10/04/2023 9:02 AM Reason for block: surgical anesthesia Staffing Performed: anesthesiologist  Anesthesiologist: Merla Almarie HERO, DO Performed by: Merla Almarie HERO, DO Authorized by: Merla Almarie HERO, DO   Preanesthetic Checklist Completed: patient identified, IV checked, risks and benefits discussed, surgical consent, monitors and equipment checked, pre-op evaluation and timeout performed Spinal Block Patient position: sitting Prep: DuraPrep and site prepped and draped Patient monitoring: cardiac monitor, continuous pulse ox and blood pressure Approach: midline Location: L3-4 Injection technique: single-shot Needle Needle type: Pencan  Needle gauge: 24 G Needle length: 9 cm Assessment Sensory level: T6 Events: CSF return Additional Notes Functioning IV was confirmed and monitors were applied. Sterile prep and drape, including hand hygiene and sterile gloves were used. The patient was positioned and the spine was prepped. The skin was anesthetized with lidocaine .  Free flow of clear CSF was obtained prior to injecting local anesthetic into the CSF.  The spinal needle aspirated freely following injection.  The needle was carefully withdrawn.  The patient tolerated the procedure well.

## 2023-10-04 NOTE — Op Note (Signed)
 DATE OF SURGERY:  10/04/2023 TIME: 10:19 AM  PATIENT NAME:  Virginia Russell   AGE: 78 y.o.    PRE-OPERATIVE DIAGNOSIS:  OA RIGHT KNEE  POST-OPERATIVE DIAGNOSIS:  Same  PROCEDURE:  Procedure(s): ARTHROPLASTY, KNEE, TOTAL   SURGEON:  [name], MD   ASSISTANT:  Gerard Large, PA-C, she was present and scrubbed throughout the case, critical for completion in a timely fashion, and for retraction, instrumentation, and closure.    OPERATIVE IMPLANTS: [employer] Triathlon CR. Press fit knee  Femur size 3, Tibia size 3, Patella size 29 3-peg oval button, with a 9 mm polyethylene insert.   PREOPERATIVE INDICATIONS:  Virginia Russell is a 78 y.o. year old female with end stage bone on bone degenerative arthritis of the knee who failed conservative treatment, including injections, antiinflammatories, activity modification, and assistive devices, and had significant impairment of their activities of daily living, and elected for Total Knee Arthroplasty.   The risks, benefits, and alternatives were discussed at length including but not limited to the risks of infection, bleeding, nerve injury, stiffness, blood clots, the need for revision surgery, cardiopulmonary complications, among others, and they were willing to proceed.   OPERATIVE DESCRIPTION:  The patient was brought to the operative room and placed in a supine position.  General anesthesia was administered.  IV antibiotics were given.  The lower extremity was prepped and draped in the usual sterile fashion.  Time out was performed.  The leg was elevated and exsanguinated and the tourniquet was inflated.  Anterior approach was performed.  The patella was everted and osteophytes were removed.  The anterior horn of the medial and lateral meniscus was removed.   The distal femur was opened with the drill and the intramedullary distal femoral cutting jig was utilized, set at 5 degrees resecting 8 mm off the distal femur.  Care  was taken to protect the collateral ligaments.  The distal femoral sizing jig was applied, taking care to avoid notching.  Then the 4-in-1 cutting jig was applied and the anterior and posterior femur was cut, along with the chamfer cuts.  All posterior osteophytes were removed.  The flexion gap was then measured and was symmetric with the extension gap.  Then the extramedullary tibial cutting jig was utilized making the appropriate cut using the anterior tibial crest as a reference building in appropriate posterior slope.  Care was taken during the cut to protect the medial and collateral ligaments.  The proximal tibia was removed along with the posterior horns of the menisci.    I completed the distal femoral preparation using the appropriate jig to prepare the box.  The patella was then measured, and cut with the saw.    The proximal tibia sized and prepared accordingly with the reamer and the punch, and then all components were trialed with the above sized poly insert.  The knee was found to have excellent balance and full motion.    The above named components were then impacted into place and Poly tibial piece and patella were inserted.  I was very happy with his stability and ROM  I performed a periarticular injection with Exparel  The knee was easily taken through a range of motion and the patella tracked well and the knee irrigated copiously and the parapatellar and subcutaneous tissue closed with vicryl, and monocryl with steri strips for the skin.  The incision was dressed with sterile gauze and the tourniquet released and the patient was awakened and returned to the  PACU in stable and satisfactory condition.  There were no complications.  Total tourniquet time was roughly 60 minutes.   POSTOPERATIVE PLAN: post op Abx, DVT px: SCD's, TED's, Early ambulation and chemical px

## 2023-10-04 NOTE — Anesthesia Postprocedure Evaluation (Signed)
 Anesthesia Post Note  Patient: Dorcus Riga Buice  Procedure(s) Performed: ARTHROPLASTY, KNEE, TOTAL (Right: Knee)     Patient location during evaluation: PACU Anesthesia Type: Regional, MAC and Spinal Level of consciousness: awake and alert and oriented Pain management: pain level controlled Vital Signs Assessment: post-procedure vital signs reviewed and stable Respiratory status: spontaneous breathing, nonlabored ventilation and respiratory function stable Cardiovascular status: blood pressure returned to baseline and stable Postop Assessment: no headache, no backache, spinal receding and no apparent nausea or vomiting Anesthetic complications: no   No notable events documented.  Last Vitals:  Vitals:   10/04/23 1212 10/04/23 1215  BP: (!) 144/63   Pulse: 76   Resp:    Temp:  (P) 36.7 C  SpO2: 92%     Last Pain:  Vitals:   10/04/23 1215  TempSrc: (P) Oral  PainSc:                  Almarie CHRISTELLA Marchi

## 2023-10-04 NOTE — Progress Notes (Signed)
 Physical Therapy Treatment Patient Details Name: Virginia Russell [facility] MRN: [id number] DOB: [birth year] Today's Date: 10/04/2023   History of Present Illness 78 yo female presents to therapy s/p R TKA on 10/04/2023 due to failure of conservative measures. Pt PMH includes but is not limited to: anxiety, aortic stenosis, carotid stenosis, DM II, HLD, HTN, neck and shoulder surgeries.    PT Comments   Drishti Pepperman is a 78 y.o. female POD 0 s/p R TKA. Patient reports IND with mobility at baseline. Patient is now limited by functional impairments (see PT problem list below) and requires CGA and cues for transfers and gait with RW. Patient was able to ambulate 50 and 45 feet with RW and CGA and cues for safe walker management. Patient educated on safe sequencing for stair mobility with HHA and SPC, fall risk prevention, pain management and goal, use of CP and ice, and car transfers pt and daughter verbalized understanding of safe guarding position for people assisting with mobility. Patient instructed in exercises to facilitate ROM and circulation reviewed and HO provided. Patient will benefit from continued skilled PT interventions to address impairments and progress towards PLOF. Patient has met mobility goals at adequate level for discharge home with family support and OPPT services; will continue to follow if pt continues acute stay to progress towards Mod I goals.    If plan is discharge home, recommend the following: A little help with walking and/or transfers;A little help with bathing/dressing/bathroom;Assistance with cooking/housework;Assist for transportation;Help with stairs or ramp for entrance   Can travel by private vehicle        Equipment Recommendations  Rolling walker (2 wheels)    Recommendations for Other Services       Precautions / Restrictions Precautions Precautions: Knee;Fall Restrictions Weight Bearing Restrictions Per Provider Order: No     Mobility  Bed  Mobility Overal bed mobility: Needs Assistance Bed Mobility: Supine to Sit     Supine to sit: Contact guard, HOB elevated     General bed mobility comments: min cues    Transfers Overall transfer level: Needs assistance Equipment used: Rolling walker (2 wheels) Transfers: Sit to/from Stand Sit to Stand: Contact guard assist           General transfer comment: min cues for safety and proper UE and AD placement with bed, commode and recliner transfers    Ambulation/Gait Ambulation/Gait assistance: Contact guard assist Gait Distance (Feet): 50 Feet (and 45) Assistive device: Rolling walker (2 wheels) Gait Pattern/deviations: Step-to pattern, Decreased stance time - right, Antalgic, Trunk flexed Gait velocity: decreased     General Gait Details: slight trunk flexion with B UE support at RW, cues for posture, proper RW management and distance from RW, cues also provided for R LE WBAT   Stairs Stairs: Yes Stairs assistance: Contact guard assist Stair Management: Two rails, With cane Number of Stairs: 2 General stair comments: step navigation instruction initiated with B handrail and CGA with cues for safety, proper sequencing with step to pattern, pt reported that spouse has a SPC that can be used for step navigation in home setting, Pt performed step navigation with a second bout using HHA and SPC, daughter whom lives with pt present and also reports brother will be able to assist when they arrive home with step navigation pt and daughter verbalized understanding of use of SPC for step navigation with HHA   Wheelchair Mobility     Tilt Bed    Modified Rankin (Stroke Patients Only)  Balance Overall balance assessment: Needs assistance, History of Falls (3-4 in the past 6 months) Sitting-balance support: Feet supported Sitting balance-Leahy Scale: Good     Standing balance support: Bilateral upper extremity supported, During functional activity, Reliant on  assistive device for balance Standing balance-Leahy Scale: Fair Standing balance comment: static standing no UE support                            Communication Communication Communication: No apparent difficulties  Cognition Arousal: Alert Behavior During Therapy: WFL for tasks assessed/performed   PT - Cognitive impairments: No apparent impairments                         Following commands: Intact      Cueing    Exercises Total Joint Exercises Ankle Circles/Pumps: AROM, Both, 10 reps Quad Sets: AROM, Right, 5 reps Short Arc Quad: AROM, Right, 5 reps Heel Slides: AROM, Right, 5 reps Hip ABduction/ADduction: AROM, Right, 5 reps Straight Leg Raises: AROM, Right, 5 reps Knee Flexion: AROM, Right, 5 reps, Seated    General Comments        Pertinent Vitals/Pain Pain Assessment Pain Assessment: 0-10 Pain Score: 4  Pain Location: R knee and LE Pain Descriptors / Indicators: Aching, Constant, Discomfort, Dull, Grimacing, Operative site guarding Pain Intervention(s): Limited activity within patient's tolerance, Monitored during session, Premedicated before session, Repositioned, Ice applied    Home Living Family/patient expects to be discharged to:: Private residence Living Arrangements: Spouse/significant other Available Help at Discharge: Family Type of Home: House Home Access: Stairs to enter Entrance Stairs-Rails: None Entrance Stairs-Number of Steps: 4 (or 5 steps from the back of the home with a handrail)   Home Layout: One level Home Equipment: None      Prior Function            PT Goals (current goals can now be found in the care plan section) Acute Rehab PT Goals Patient Stated Goal: to be able to get up and go, return to work PT Goal Formulation: With patient Time For Goal Achievement: 10/18/23 Potential to Achieve Goals: Good    Frequency    7X/week      PT Plan      Co-evaluation              AM-PAC PT 6  Clicks Mobility   Outcome Measure  Help needed turning from your back to your side while in a flat bed without using bedrails?: None Help needed moving from lying on your back to sitting on the side of a flat bed without using bedrails?: A Little Help needed moving to and from a bed to a chair (including a wheelchair)?: A Little Help needed standing up from a chair using your arms (e.g., wheelchair or bedside chair)?: A Little Help needed to walk in hospital room?: A Little Help needed climbing 3-5 steps with a railing? : A Little 6 Click Score: 19    End of Session Equipment Utilized During Treatment: Gait belt Activity Tolerance: No increased pain;Patient tolerated treatment well Patient left: in chair;with call bell/phone within reach;with family/visitor present Nurse Communication: Mobility status;Other (comment) (pt readiness for same day d/c at this time) PT Visit Diagnosis: Unsteadiness on feet (R26.81);Other abnormalities of gait and mobility (R26.89);Muscle weakness (generalized) (M62.81);Difficulty in walking, not elsewhere classified (R26.2);History of falling (Z91.81);Pain Pain - Right/Left: Right Pain - part of body: Knee;Leg  Time: 8593-8555 PT Time Calculation (min) (ACUTE ONLY): 38 min  Charges:    $Gait Training: 8-22 mins $Therapeutic Exercise: 8-22 mins $Therapeutic Activity: 8-22 mins PT General Charges $$ ACUTE PT VISIT: 1 Visit                     [place], PT Acute Rehab    [place] VEAR Drone 10/04/2023, 2:56 PM

## 2023-10-04 NOTE — Anesthesia Procedure Notes (Signed)
 Anesthesia Regional Block: Adductor canal block   Pre-Anesthetic Checklist: , timeout performed,  Correct Patient, Correct Site, Correct Laterality,  Correct Procedure, Correct Position, site marked,  Risks and benefits discussed,  Surgical consent,  Pre-op evaluation,  At surgeon's request and post-op pain management  Laterality: Right  Prep: Maximum Sterile Barrier Precautions used, chloraprep       Needles:  Injection technique: Single-shot  Needle Type: Echogenic Stimulator Needle     Needle Length: 9cm  Needle Gauge: 22     Additional Needles:   Procedures:,,,, ultrasound used (permanent image in chart),,    Narrative:  Start time: 10/04/2023 8:15 AM End time: 10/04/2023 8:20 AM Injection made incrementally with aspirations every 5 mL.  Performed by: Personally  Anesthesiologist: Merla Almarie HERO, DO  Additional Notes: Monitors applied. No increased pain on injection. No increased resistance to injection. Injection made in 5cc increments. Good needle visualization. Patient tolerated procedure well.

## 2023-10-04 NOTE — Progress Notes (Signed)
 Orthopedic Tech Progress Note Patient Details:  Virginia Russell [facility] [birth year] [id number]  Ortho Devices Type of Ortho Device: Bone foam zero knee Ortho Device/Splint Location: right Ortho Device/Splint Interventions: Ordered, Application, Adjustment   Post Interventions Patient Tolerated: Well Instructions Provided: Adjustment of device, Care of device  Waylan Thom Loving 10/04/2023, 1:06 PM

## 2023-10-04 NOTE — Interval H&P Note (Signed)
 History and Physical Interval Note:  10/04/2023 7:12 AM  Virginia Russell  has presented today for surgery, with the diagnosis of OA RIGHT KNEE.  The various methods of treatment have been discussed with the patient and family. After consideration of risks, benefits and other options for treatment, the patient has consented to  Procedure(s): ARTHROPLASTY, KNEE, TOTAL (Right) as a surgical intervention.  The patient's history has been reviewed, patient examined, no change in status, stable for surgery.  I have reviewed the patient's chart and labs.  Questions were answered to the patient's satisfaction.     [name]

## 2023-10-05 ENCOUNTER — Encounter (HOSPITAL_COMMUNITY): Payer: Self-pay | Admitting: Orthopedic Surgery

## 2023-10-06 ENCOUNTER — Ambulatory Visit: Admitting: Cardiology

## 2023-10-06 DIAGNOSIS — M1711 Unilateral primary osteoarthritis, right knee: Secondary | ICD-10-CM | POA: Diagnosis not present

## 2023-10-18 DIAGNOSIS — M1711 Unilateral primary osteoarthritis, right knee: Secondary | ICD-10-CM | POA: Diagnosis not present

## 2023-10-20 DIAGNOSIS — M1711 Unilateral primary osteoarthritis, right knee: Secondary | ICD-10-CM | POA: Diagnosis not present

## 2023-10-25 DIAGNOSIS — M1711 Unilateral primary osteoarthritis, right knee: Secondary | ICD-10-CM | POA: Diagnosis not present

## 2023-10-27 DIAGNOSIS — M1711 Unilateral primary osteoarthritis, right knee: Secondary | ICD-10-CM | POA: Diagnosis not present

## 2023-11-01 DIAGNOSIS — M1711 Unilateral primary osteoarthritis, right knee: Secondary | ICD-10-CM | POA: Diagnosis not present

## 2023-11-08 DIAGNOSIS — M1711 Unilateral primary osteoarthritis, right knee: Secondary | ICD-10-CM | POA: Diagnosis not present

## 2023-11-15 ENCOUNTER — Telehealth: Payer: Self-pay | Admitting: Internal Medicine

## 2023-11-15 NOTE — Telephone Encounter (Signed)
 Copied from CRM [id number]. Topic: Clinical - Medication Refill >> Nov 15, 2023  1:12 PM Kevelyn M wrote: Medication: tiZANidine (ZANAFLEX) 4 MG tablet  Has the patient contacted their pharmacy? Yes (Agent: If no, request that the patient contact the pharmacy for the refill. If patient does not wish to contact the pharmacy document the reason why and proceed with request.) (Agent: If yes, when and what did the pharmacy advise?)  This is the patient's preferred pharmacy:  [phone] [facility], [place] - [address] [place] [address] Phone: [phone] Fax: [phone]  Is this the correct pharmacy for this prescription? Yes If no, delete pharmacy and type the correct one.   Has the prescription been filled recently?  No Is the patient out of the medication? Yes  Has the patient been seen for an appointment in the last year OR does the patient have an upcoming appointment? Yes  Can we respond through MyChart? Yes  Agent: Please be advised that Rx refills may take up to 3 business days. We ask that you follow-up with your pharmacy.

## 2023-11-16 DIAGNOSIS — M1711 Unilateral primary osteoarthritis, right knee: Secondary | ICD-10-CM | POA: Diagnosis not present

## 2023-11-30 ENCOUNTER — Ambulatory Visit: Admitting: Cardiology

## 2023-12-12 DIAGNOSIS — H0102A Squamous blepharitis right eye, upper and lower eyelids: Secondary | ICD-10-CM | POA: Diagnosis not present

## 2023-12-12 DIAGNOSIS — H0102B Squamous blepharitis left eye, upper and lower eyelids: Secondary | ICD-10-CM | POA: Diagnosis not present

## 2023-12-12 DIAGNOSIS — H40013 Open angle with borderline findings, low risk, bilateral: Secondary | ICD-10-CM | POA: Diagnosis not present

## 2023-12-12 DIAGNOSIS — Z961 Presence of intraocular lens: Secondary | ICD-10-CM | POA: Diagnosis not present

## 2023-12-12 DIAGNOSIS — E119 Type 2 diabetes mellitus without complications: Secondary | ICD-10-CM | POA: Diagnosis not present

## 2023-12-12 DIAGNOSIS — M1711 Unilateral primary osteoarthritis, right knee: Secondary | ICD-10-CM | POA: Diagnosis not present

## 2023-12-12 DIAGNOSIS — H26493 Other secondary cataract, bilateral: Secondary | ICD-10-CM | POA: Diagnosis not present

## 2023-12-12 DIAGNOSIS — H43813 Vitreous degeneration, bilateral: Secondary | ICD-10-CM | POA: Diagnosis not present

## 2023-12-12 DIAGNOSIS — H10413 Chronic giant papillary conjunctivitis, bilateral: Secondary | ICD-10-CM | POA: Diagnosis not present

## 2023-12-12 LAB — HM DIABETES EYE EXAM

## 2023-12-26 ENCOUNTER — Encounter: Payer: Self-pay | Admitting: Internal Medicine

## 2023-12-26 ENCOUNTER — Ambulatory Visit: Admitting: Internal Medicine

## 2023-12-26 ENCOUNTER — Other Ambulatory Visit: Payer: Self-pay

## 2023-12-26 VITALS — BP 110/78 | HR 85 | Temp 98.3°F | Ht 61.0 in | Wt 136.0 lb

## 2023-12-26 DIAGNOSIS — I119 Hypertensive heart disease without heart failure: Secondary | ICD-10-CM

## 2023-12-26 DIAGNOSIS — E785 Hyperlipidemia, unspecified: Secondary | ICD-10-CM

## 2023-12-26 DIAGNOSIS — L03115 Cellulitis of right lower limb: Secondary | ICD-10-CM

## 2023-12-26 DIAGNOSIS — E1169 Type 2 diabetes mellitus with other specified complication: Secondary | ICD-10-CM | POA: Diagnosis not present

## 2023-12-26 DIAGNOSIS — I350 Nonrheumatic aortic (valve) stenosis: Secondary | ICD-10-CM

## 2023-12-26 DIAGNOSIS — W010XXD Fall on same level from slipping, tripping and stumbling without subsequent striking against object, subsequent encounter: Secondary | ICD-10-CM | POA: Diagnosis not present

## 2023-12-26 MED ORDER — MUPIROCIN 2 % EX OINT: 1.0000 | TOPICAL_OINTMENT | Freq: Two times a day (BID) | CUTANEOUS | 0 refills | Status: DC

## 2023-12-26 MED ORDER — CEPHALEXIN 500 MG PO CAPS: 500.0000 mg | ORAL_CAPSULE | Freq: Four times a day (QID) | ORAL | 0 refills | Status: AC

## 2023-12-26 MED ORDER — FLUCONAZOLE 150 MG PO TABS: ORAL_TABLET | ORAL | 0 refills | Status: DC

## 2023-12-26 NOTE — Patient Instructions (Signed)
 Hypertension, Adult Hypertension is another name for high blood pressure. High blood pressure forces your heart to work harder to pump blood. This can cause problems over time. There are two numbers in a blood pressure reading. There is a top number (systolic) over a bottom number (diastolic). It is best to have a blood pressure that is below 120/80. What are the causes? The cause of this condition is not known. Some other conditions can lead to high blood pressure. What increases the risk? Some lifestyle factors can make you more likely to develop high blood pressure: Smoking. Not getting enough exercise or physical activity. Being overweight. Having too much fat, sugar, calories, or salt (sodium) in your diet. Drinking too much alcohol. Other risk factors include: Having any of these conditions: Heart disease. Diabetes. High cholesterol. Kidney disease. Obstructive sleep apnea. Having a family history of high blood pressure and high cholesterol. Age. The risk increases with age. Stress. What are the signs or symptoms? High blood pressure may not cause symptoms. Very high blood pressure (hypertensive crisis) may cause: Headache. Fast or uneven heartbeats (palpitations). Shortness of breath. Nosebleed. Vomiting or feeling like you may vomit (nauseous). Changes in how you see. Very bad chest pain. Feeling dizzy. Seizures. How is this treated? This condition is treated by making healthy lifestyle changes, such as: Eating healthy foods. Exercising more. Drinking less alcohol. Your doctor may prescribe medicine if lifestyle changes do not help enough and if: Your top number is above 130. Your bottom number is above 80. Your personal target blood pressure may vary. Follow these instructions at home: Eating and drinking  If told, follow the DASH eating plan. To follow this plan: Fill one half of your plate at each meal with fruits and vegetables. Fill one fourth of your plate  at each meal with whole grains. Whole grains include whole-wheat pasta, brown rice, and whole-grain bread. Eat or drink low-fat dairy products, such as skim milk or low-fat yogurt. Fill one fourth of your plate at each meal with low-fat (lean) proteins. Low-fat proteins include fish, chicken without skin, eggs, beans, and tofu. Avoid fatty meat, cured and processed meat, or chicken with skin. Avoid pre-made or processed food. Limit the amount of salt in your diet to less than 1,500 mg each day. Do not drink alcohol if: Your doctor tells you not to drink. You are pregnant, may be pregnant, or are planning to become pregnant. If you drink alcohol: Limit how much you have to: 0-1 drink a day for women. 0-2 drinks a day for men. Know how much alcohol is in your drink. In the [place] one drink equals one 12 oz bottle of beer (355 mL), one 5 oz glass of wine (148 mL), or one 1 oz glass of hard liquor (44 mL). Lifestyle  Work with your doctor to stay at a healthy weight or to lose weight. Ask your doctor what the best weight is for you. Get at least 30 minutes of exercise that causes your heart to beat faster (aerobic exercise) most days of the week. This may include walking, swimming, or biking. Get at least 30 minutes of exercise that strengthens your muscles (resistance exercise) at least 3 days a week. This may include lifting weights or doing Pilates. Do not smoke or use any products that contain nicotine or tobacco. If you need help quitting, ask your doctor. Check your blood pressure at home as told by your doctor. Keep all follow-up visits. Medicines Take over-the-counter and prescription medicines  only as told by your doctor. Follow directions carefully. Do not skip doses of blood pressure medicine. The medicine does not work as well if you skip doses. Skipping doses also puts you at risk for problems. Ask your doctor about side effects or reactions to medicines that you should watch  for. Contact a doctor if: You think you are having a reaction to the medicine you are taking. You have headaches that keep coming back. You feel dizzy. You have swelling in your ankles. You have trouble with your vision. Get help right away if: You get a very bad headache. You start to feel mixed up (confused). You feel weak or numb. You feel faint. You have very bad pain in your: Chest. Belly (abdomen). You vomit more than once. You have trouble breathing. These symptoms may be an emergency. Get help right away. Call 911. Do not wait to see if the symptoms will go away. Do not drive yourself to the hospital. Summary Hypertension is another name for high blood pressure. High blood pressure forces your heart to work harder to pump blood. For most people, a normal blood pressure is less than 120/80. Making healthy choices can help lower blood pressure. If your blood pressure does not get lower with healthy choices, you may need to take medicine. This information is not intended to replace advice given to you by your health care provider. Make sure you discuss any questions you have with your health care provider. Document Revised: 01/08/2021 Document Reviewed: 01/08/2021 Elsevier Patient Education  2024 [employer].

## 2023-12-26 NOTE — Progress Notes (Signed)
 I,[name], CMA,acting as a Neurosurgeon for Virginia LOISE Slocumb, MD.,have documented all relevant documentation on the behalf of Virginia LOISE Slocumb, MD,as directed by  Virginia LOISE Slocumb, MD while in the presence of Virginia LOISE Slocumb, MD.  Subjective:  Patient ID: Virginia Russell , female    DOB: [birth year] , 78 y.o.   MRN: [id number]  Chief Complaint  Patient presents with   Hypertension    The patient is here today for a follow-up on her diabetes and blood pressure.  She reports compliance with meds. She has no new concerns at this time. Denies headache, chest pain, and SOB.  She states having a recent fall. Causing wounds to her legs. The wound on her right lower leg is still open & slowly healing. Which worries her , she does not want it to become infected.    Diabetes    HPI Discussed the use of AI scribe software for clinical note transcription with the patient, who gave verbal consent to proceed.  History of Present Illness Virginia Russell is a 78 year old female with diabetes and hypertension who presents for a follow-up visit and evaluation of a fall-related injury.  She experienced a fall on December 01, 2023, while ascending the steps to her front porch, resulting in a hard impact on both knees. She was told by Dr. Beverley, her orthopedist, that there was no damage to her knee replacement, but she had a significant abrasion on her right lower leg.  Since the fall, she has had persistent sharp pain in her right lower leg. She uses a topical antibiotic patch, which prevents drainage unless applied. She has not been prescribed oral antibiotics. Swelling in her knee occurs by the end of the day, which she manages with ice application.  She underwent knee replacement surgery prior to the fall and completed six weeks of physical therapy. Her knee swells significantly, which is expected to last six months to a year post-surgery. Initially, she resisted taking pain medication but found it  necessary during early recovery.  She has a history of aortic stenosis, initially thought to be mild but later determined to be moderate. A follow-up with her cardiologist was postponed due to her knee surgery, and she is unsure of her next appointment date.  She is allergic to codeine and has a history of developing hives after anesthesia for a transesophageal echocardiogram. She tends to develop yeast infections when taking antibiotics. She has not eaten today but consumed half a protein shake.   Hypertension This is a chronic problem. The current episode started more than 1 year ago. The problem has been gradually improving since onset. The problem is controlled. Pertinent negatives include no blurred vision or palpitations. Past treatments include ACE inhibitors and diuretics. The current treatment provides moderate improvement.  Diabetes She presents for her follow-up diabetic visit. She has type 2 diabetes mellitus. No MedicAlert identification noted. The initial diagnosis of diabetes was made 1 year ago. Her disease course has been stable. There are no hypoglycemic associated symptoms. Pertinent negatives for diabetes include no blurred vision, no polydipsia, no polyphagia and no polyuria. There are no hypoglycemic complications. Symptoms are stable. Risk factors for coronary artery disease include dyslipidemia, diabetes mellitus, hypertension, post-menopausal and stress. She is compliant with treatment all of the time. She is following a diabetic diet. She participates in exercise intermittently. Her breakfast blood glucose is taken between 7-8 am. Her breakfast blood glucose range is generally 90-110 mg/dl. An ACE  inhibitor/angiotensin II receptor blocker is being taken. Eye exam is current.     Past Medical History:  Diagnosis Date   Anxiety    Aortic stenosis, mild 02/11/2020   Mean gradient 9 mmHg on 11/2019   Arthritis    Carotid stenosis, bilateral 02/11/2020   Diabetes mellitus  without complication (HCC)    High cholesterol    HTN (hypertension)    Hyperlipidemia LDL goal <70 02/11/2020   PONV (postoperative nausea and vomiting)    Skin cancer      Family History  Problem Relation Age of Onset   Breast cancer Mother    Heart disease Mother    Heart attack Mother    Stroke Father    Heart attack Father    Heart attack Daughter    Heart attack Brother    Heart attack Maternal Grandmother    Heart attack Paternal Grandmother      Current Outpatient Medications:    Accu-Chek Softclix Lancets lancets, Use as instructed to check blood sugars 1 time per day dx: e11.9, Disp: 150 each, Rfl: 3   Alcohol Swabs  (B-D SINGLE USE SWABS  BUTTERFLY) PADS, 100 Pieces by Does not apply route as needed., Disp: 100 each, Rfl: 3   amLODipine  (NORVASC ) 5 MG tablet, TAKE 1 TABLET BY MOUTH EVERY EVENING, Disp: 90 tablet, Rfl: 3   aspirin  EC 81 MG tablet, Take 1 tablet (81 mg total) by mouth 2 (two) times daily. To prevent blood clots for 30 days after surgery., Disp: 60 tablet, Rfl: 0   atorvastatin  (LIPITOR) 40 MG tablet, TAKE 1 TABLET EVERY NIGHT, Disp: 90 tablet, Rfl: 3   b complex vitamins capsule, Take 1 capsule by mouth daily., Disp: , Rfl:    Blood Glucose Monitoring Suppl (ACCU-CHEK GUIDE) w/Device KIT, Use to check blood sugars once daily E11.9, Disp: 1 kit, Rfl: 2   celecoxib  (CELEBREX ) 100 MG capsule, Take 100 mg by mouth 2 (two) times daily., Disp: , Rfl:    cephALEXin  (KEFLEX ) 500 MG capsule, Take 1 capsule (500 mg total) by mouth 4 (four) times daily for 10 days., Disp: 40 capsule, Rfl: 0   chlorhexidine  (HIBICLENS ) 4 % external liquid, Apply 15 mLs (1 Application total) topically as directed for 30 doses. Use as directed daily for 5 days every other week for 6 weeks., Disp: 946 mL, Rfl: 1   cycloSPORINE (RESTASIS) 0.05 % ophthalmic emulsion, Place 1 drop into both eyes 2 (two) times daily., Disp: , Rfl:    diclofenac Sodium (VOLTAREN ARTHRITIS PAIN) 1 % GEL, Apply 2  g topically daily as needed (Pain)., Disp: , Rfl:    fluconazole  (DIFLUCAN ) 150 MG tablet, Take one tab po today, repeat in 2 days, Disp: 2 tablet, Rfl: 0   glucose blood (ACCU-CHEK AVIVA PLUS) test strip, Use as instructed to check blood sugars 1 time per day dx: e11.22, Disp: 150 each, Rfl: 3   glucose blood (ACCU-CHEK GUIDE) test strip, Use as instructed to check blood sugars once daily E11.9, Disp: 100 each, Rfl: 3   HYDROcodone -acetaminophen  (NORCO) 10-325 MG tablet, Take 1 tablet by mouth every 4 (four) hours as needed for severe pain (pain score 7-10). in knee after surgery, Disp: 28 tablet, Rfl: 0   hydrOXYzine (ATARAX) 25 MG tablet, Take 25 mg by mouth 3 (three) times daily as needed for itching., Disp: , Rfl:    losartan  (COZAAR ) 50 MG tablet, Take 1 tablet (50 mg total) by mouth daily., Disp: 90 tablet, Rfl: 3  Menthol, Topical Analgesic, (BIOFREEZE COOL THE PAIN LARGE) 5 % PTCH, Place 1 patch onto the skin daily as needed (pain.)., Disp: , Rfl:    metFORMIN  (GLUCOPHAGE -XR) 750 MG 24 hr tablet, Take 2 tablets (1,500 mg total) by mouth daily after supper., Disp: 180 tablet, Rfl: 2   methocarbamol  (ROBAXIN ) 750 MG tablet, Take 1 tablet (750 mg total) by mouth every 8 (eight) hours as needed for muscle spasms., Disp: 21 tablet, Rfl: 0   ondansetron  (ZOFRAN -ODT) 4 MG disintegrating tablet, Take 1 tablet (4 mg total) by mouth every 8 (eight) hours as needed for nausea or vomiting., Disp: 15 tablet, Rfl: 0   oxymetazoline (AFRIN) 0.05 % nasal spray, Place 1 spray into both nostrils at bedtime as needed for congestion., Disp: , Rfl:    sitaGLIPtin  (JANUVIA ) 100 MG tablet, Take 1 tablet (100 mg total) by mouth daily., Disp: 90 tablet, Rfl: 1   venlafaxine  (EFFEXOR ) 37.5 MG tablet, TAKE 1 TABLET BY MOUTH EVERY DAY, Disp: 90 tablet, Rfl: 2   mupirocin  ointment (BACTROBAN ) 2 %, Apply 1 Application topically 2 (two) times daily., Disp: 22 g, Rfl: 0   Allergies  Allergen Reactions   Codeine Nausea  And Vomiting     Review of Systems  Constitutional: Negative.   Eyes:  Negative for blurred vision.  Respiratory: Negative.    Cardiovascular: Negative.  Negative for palpitations.  Gastrointestinal: Negative.   Endocrine: Negative for polydipsia, polyphagia and polyuria.  Neurological: Negative.      Today's Vitals   12/26/23 1104  BP: 110/78  Pulse: 85  Temp: 98.3 F (36.8 C)  SpO2: 98%  Weight: 136 lb (61.7 kg)  Height: 5' 1 (1.549 m)   Body mass index is 25.7 kg/m.  Wt Readings from Last 3 Encounters:  12/26/23 136 lb (61.7 kg)  10/04/23 134 lb 3.2 oz (60.9 kg)  09/27/23 134 lb 3.2 oz (60.9 kg)     Objective:  Physical Exam Vitals and nursing note reviewed.  Constitutional:      Appearance: Normal appearance.  HENT:     Head: Normocephalic and atraumatic.  Eyes:     Extraocular Movements: Extraocular movements intact.  Cardiovascular:     Rate and Rhythm: Normal rate and regular rhythm.     Heart sounds: Murmur heard.  Pulmonary:     Effort: Pulmonary effort is normal.     Breath sounds: Normal breath sounds.  Musculoskeletal:     Cervical back: Normal range of motion.  Skin:    General: Skin is warm.     Comments: Wound on RLE w/ surrounding erythema  Neurological:     General: No focal deficit present.     Mental Status: She is alert.  Psychiatric:        Mood and Affect: Mood normal.        Behavior: Behavior normal.         Assessment And Plan:  Hypertensive heart disease without heart failure Assessment & Plan: Chronic, well controlled. No med changes.  Encouraged to follow a low sodium diet.   - Continue with amlodipine  5mg  daily and losartan  50mg  daily.  Orders: -     CMP14+EGFR -     Lipid panel  Nonrheumatic aortic valve stenosis Assessment & Plan: Moderate aortic stenosis, missed last cardiology appointment. - Provided cardiology phone number to reschedule follow-up. - Encouraged to keep regular cardiology f/   Dyslipidemia  associated with type 2 diabetes mellitus (HCC) Assessment & Plan: Chronic, LDL goal is less  than 70. She was congratulated on recent lifestyle changes. She will continue with Januvia  and metformin . Will consider addition of SGLT2i for cardiac/renal protection.  - Importance of medication/dietary compliance was discussed with the patient.  Orders: -     CMP14+EGFR -     Hemoglobin A1c  Fall on same level from slipping, tripping or stumbling, subsequent encounter Assessment & Plan: Occurred on 8/28 - Unfortunately, she suffered an injury   Cellulitis of right lower extremity Assessment & Plan: Wound with sharp pain and incomplete healing, concern for infection due to white appearance. - Prescribed Keflex  4 times daily for infection. - Provided nonadhesive gauze, consider additional cream for wound care. - Prescribed medication for yeast infection prophylaxis as needed.   Other orders -     Cephalexin ; Take 1 capsule (500 mg total) by mouth 4 (four) times daily for 10 days.  Dispense: 40 capsule; Refill: 0 -     Fluconazole ; Take one tab po today, repeat in 2 days  Dispense: 2 tablet; Refill: 0   Return if symptoms worsen or fail to improve.  Patient was given opportunity to ask questions. Patient verbalized understanding of the plan and was able to repeat key elements of the plan. All questions were answered to their satisfaction.   I, Virginia LOISE Slocumb, MD, have reviewed all documentation for this visit. The documentation on 12/26/23 for the exam, diagnosis, procedures, and orders are all accurate and complete.   IF YOU HAVE BEEN REFERRED TO A SPECIALIST, IT MAY TAKE 1-2 WEEKS TO SCHEDULE/PROCESS THE REFERRAL. IF YOU HAVE NOT HEARD FROM US /SPECIALIST IN TWO WEEKS, PLEASE GIVE US  A CALL AT [phone] X [phone].   THE PATIENT IS ENCOURAGED TO PRACTICE SOCIAL DISTANCING DUE TO THE COVID-19 PANDEMIC.

## 2023-12-27 LAB — CMP14+EGFR
ALT: 8 IU/L (ref 0–32)
AST: 12 IU/L (ref 0–40)
Albumin: 4.3 g/dL (ref 3.8–4.8)
Alkaline Phosphatase: 75 IU/L (ref 49–135)
BUN/Creatinine Ratio: 36 — ABNORMAL HIGH (ref 12–28)
BUN: 21 mg/dL (ref 8–27)
Bilirubin Total: 0.5 mg/dL (ref 0.0–1.2)
CO2: 25 mmol/L (ref 20–29)
Calcium: 9.6 mg/dL (ref 8.7–10.3)
Chloride: 100 mmol/L (ref 96–106)
Creatinine, Ser: 0.59 mg/dL (ref 0.57–1.00)
Globulin, Total: 2.1 g/dL (ref 1.5–4.5)
Glucose: 130 mg/dL — ABNORMAL HIGH (ref 70–99)
Potassium: 4.5 mmol/L (ref 3.5–5.2)
Sodium: 141 mmol/L (ref 134–144)
Total Protein: 6.4 g/dL (ref 6.0–8.5)
eGFR: 92 mL/min/1.73 (ref >59)

## 2023-12-27 LAB — LIPID PANEL
Chol/HDL Ratio: 2.9 ratio (ref 0.0–4.4)
Cholesterol, Total: 154 mg/dL (ref 100–199)
HDL: 54 mg/dL (ref >39)
LDL Chol Calc (NIH): 76 mg/dL (ref 0–99)
Triglycerides: 136 mg/dL (ref 0–149)
VLDL Cholesterol Cal: 24 mg/dL (ref 5–40)

## 2023-12-27 LAB — HEMOGLOBIN A1C
Est. average glucose Bld gHb Est-mCnc: 192 mg/dL
Hgb A1c MFr Bld: 8.3 % — ABNORMAL HIGH (ref 4.8–5.6)

## 2023-12-31 ENCOUNTER — Encounter: Payer: Self-pay | Admitting: Internal Medicine

## 2023-12-31 ENCOUNTER — Ambulatory Visit: Payer: Self-pay | Admitting: Internal Medicine

## 2023-12-31 DIAGNOSIS — L03115 Cellulitis of right lower limb: Secondary | ICD-10-CM | POA: Insufficient documentation

## 2023-12-31 NOTE — Assessment & Plan Note (Signed)
 Moderate aortic stenosis, missed last cardiology appointment. - Provided cardiology phone number to reschedule follow-up. - Encouraged to keep regular cardiology f/

## 2023-12-31 NOTE — Assessment & Plan Note (Signed)
 Chronic, well controlled. No med changes.  Encouraged to follow a low sodium diet.   - Continue with amlodipine  5mg  daily and losartan  50mg  daily.

## 2023-12-31 NOTE — Assessment & Plan Note (Signed)
 Wound with sharp pain and incomplete healing, concern for infection due to white appearance. - Prescribed Keflex  4 times daily for infection. - Provided nonadhesive gauze, consider additional cream for wound care. - Prescribed medication for yeast infection prophylaxis as needed.

## 2023-12-31 NOTE — Assessment & Plan Note (Addendum)
 Chronic, LDL goal is less than 70. She was congratulated on recent lifestyle changes. She will continue with Januvia  and metformin . Will consider addition of SGLT2i for cardiac/renal protection.  - Importance of medication/dietary compliance was discussed with the patient.

## 2023-12-31 NOTE — Assessment & Plan Note (Signed)
 Occurred on 8/28 - Unfortunately, she suffered an injury

## 2024-01-02 ENCOUNTER — Other Ambulatory Visit: Payer: Self-pay | Admitting: Internal Medicine

## 2024-01-02 MED ORDER — MUPIROCIN 2 % EX OINT: 1.0000 | TOPICAL_OINTMENT | Freq: Two times a day (BID) | CUTANEOUS | 0 refills | Status: AC

## 2024-01-02 NOTE — Telephone Encounter (Signed)
 Copied from CRM [id number]. Topic: Clinical - Medication Refill >> Jan 02, 2024  1:08 PM Delon HERO wrote: Medication: mupirocin  ointment (BACTROBAN ) 2 % [[id number]]  Has the patient contacted their pharmacy? Yes (Agent: If no, request that the patient contact the pharmacy for the refill. If patient does not wish to contact the pharmacy document the reason why and proceed with request.) (Agent: If yes, when and what did the pharmacy advise?)  This is the patient's preferred pharmacy:  [facility] [phone] [facility] [place] - [address] [place] [address] Phone: [phone] Fax: [phone]   Is this the correct pharmacy for this prescription? Yes If no, delete pharmacy and type the correct one.   Has the prescription been filled recently? Yes  Is the patient out of the medication? Yes  Has the patient been seen for an appointment in the last year OR does the patient have an upcoming appointment? Yes  Can we respond through MyChart? Yes  Agent: Please be advised that Rx refills may take up to 3 business days. We ask that you follow-up with your pharmacy.

## 2024-01-26 ENCOUNTER — Telehealth: Payer: Self-pay | Admitting: Pharmacist

## 2024-01-26 DIAGNOSIS — E1169 Type 2 diabetes mellitus with other specified complication: Secondary | ICD-10-CM

## 2024-01-26 NOTE — Progress Notes (Signed)
   01/26/2024  Patient ID: Virginia Russell, female   DOB: 1945/11/13, 78 y.o.   MRN: [id number]  Patient was called regarding medication assistance with Rybelsus.  Unfortunately, she did not answer her phone. HIPAA compliant message was left on her voicemail.    Lab Results  Component Value Date   HGBA1C 8.3 (H) 12/26/2023   HGBA1C 7.4 (H) 09/27/2023   HGBA1C 7.6 (H) 08/22/2023    From chart notes, it looks like Dr. Jarold wanted to start Rybelsus in response to the Patient's elevated HgA1c but it is not on the Patient's medication list.  I will follow up with Patient in 3-5 business days.   Cassius DOROTHA Brought, PharmD, BCACP Clinical Pharmacist [phone]

## 2024-01-30 ENCOUNTER — Other Ambulatory Visit: Payer: Self-pay | Admitting: Internal Medicine

## 2024-02-07 ENCOUNTER — Telehealth: Payer: Self-pay | Admitting: Pharmacist

## 2024-02-07 NOTE — Progress Notes (Signed)
   02/07/2024  Patient ID: Virginia Russell [facility], female   DOB: 08-Jun-1945, 78 y.o.   MRN: [id number]  Patient was called to follow up on Rybelsus therapy. Unfortunately, she did not answer her phone. HIPAA compliant message was left on her voicemail.   Lab Results  Component Value Date   HGBA1C 8.3 (H) 12/26/2023   HGBA1C 7.4 (H) 09/27/2023   HGBA1C 7.6 (H) 08/22/2023    Rybelsus will not be available on [employer]'s Patient Assistance Program in 2025.  Renewal Paperwork for Januvia  will be sent to Luke Mall, CPhT for printing.  I will gather signatures and fax them back for scanning and processing.   Metformin  XR 1500 mg daily filled 12/17/23--reviewed fill history.  Last metformin  was filled #90 for a 90 day supply versus 180.   Patient was on Humalog  but reported no longer using it in may of this year.  Plan: Follow up with Patient in 1 week.--Ask about Metformin  Quantity.   Cassius DOROTHA Brought, PharmD, BCACP Clinical Pharmacist [phone]

## 2024-02-15 ENCOUNTER — Ambulatory Visit: Payer: Medicare HMO

## 2024-02-15 DIAGNOSIS — Z0000 Encounter for general adult medical examination without abnormal findings: Secondary | ICD-10-CM | POA: Diagnosis not present

## 2024-02-15 NOTE — Patient Instructions (Signed)
 Virginia Russell,  Thank you for taking the time for your Medicare Wellness Visit. I appreciate your continued commitment to your health goals. Please review the care plan we discussed, and feel free to reach out if I can assist you further.  Please note that Annual Wellness Visits do not include a physical exam. Some assessments may be limited, especially if the visit was conducted virtually. If needed, we may recommend an in-person follow-up with your provider.  Ongoing Care Seeing your primary care provider every 3 to 6 months helps us  monitor your health and provide consistent, personalized care.   Referrals If a referral was made during today's visit and you haven't received any updates within two weeks, please contact the referred provider directly to check on the status.  Recommended Screenings:  Health Maintenance  Topic Date Due   COVID-19 Vaccine (5 - 2025-26 season) 12/05/2023   Medicare Annual Wellness Visit  02/09/2024   Yearly kidney health urinalysis for diabetes  05/17/2024   Complete foot exam   05/17/2024   Hemoglobin A1C  06/24/2024   Eye exam for diabetics  12/11/2024   Yearly kidney function blood test for diabetes  12/25/2024   DTaP/Tdap/Td vaccine (2 - Td or Tdap) 05/11/2032   Pneumococcal Vaccine for age over 12  Completed   Flu Shot  Completed   DEXA scan (bone density measurement)  Completed   Hepatitis C Screening  Completed   Zoster (Shingles) Vaccine  Completed   Meningitis B Vaccine  Aged Out   Breast Cancer Screening  Discontinued   Colon Cancer Screening  Discontinued   Cologuard (Stool DNA test)  Discontinued       02/15/2024    2:43 PM  Advanced Directives  Does Patient Have a Medical Advance Directive? Yes  Type of Estate Agent of [place];Living will  Copy of Healthcare Power of Attorney in Chart? No - copy requested    Vision: Annual vision screenings are recommended for early detection of glaucoma, cataracts, and  diabetic retinopathy. These exams can also reveal signs of chronic conditions such as diabetes and high blood pressure.  Dental: Annual dental screenings help detect early signs of oral cancer, gum disease, and other conditions linked to overall health, including heart disease and diabetes.  Please see the attached documents for additional preventive care recommendations.

## 2024-02-15 NOTE — Progress Notes (Signed)
 Chief Complaint  Patient presents with   Medicare Wellness     Subjective:   Virginia Russell is a 78 y.o. female who presents for a Medicare Annual Wellness Visit.  Allergies (verified) Codeine   History: Past Medical History:  Diagnosis Date   Anxiety    Aortic stenosis, mild 02/11/2020   Mean gradient 9 mmHg on 11/2019   Arthritis    Carotid stenosis, bilateral 02/11/2020   Diabetes mellitus without complication (HCC)    High cholesterol    HTN (hypertension)    Hyperlipidemia LDL goal <70 02/11/2020   PONV (postoperative nausea and vomiting)    Skin cancer    Past Surgical History:  Procedure Laterality Date   ABDOMINAL HYSTERECTOMY     ABDOMINAL HYSTERECTOMY   BACK SURGERY     BREAST BIOPSY Left    CATARACT EXTRACTION, BILATERAL Right 04/15/2022   R performed on 04/29/22   NECK SURGERY     SHOULDER SURGERY     X2   TOTAL KNEE ARTHROPLASTY Right 10/04/2023   Procedure: ARTHROPLASTY, KNEE, TOTAL;  Surgeon: Beverley Evalene BIRCH, MD;  Location: [facility];  Service: Orthopedics;  Laterality: Right;   TRANSESOPHAGEAL ECHOCARDIOGRAM (CATH LAB) N/A 09/07/2023   Procedure: TRANSESOPHAGEAL ECHOCARDIOGRAM;  Surgeon: Delford Maude BROCKS, MD;  Location: [facility] INVASIVE CV LAB;  Service: Cardiovascular;  Laterality: N/A;   Family History  Problem Relation Age of Onset   Breast cancer Mother    Heart disease Mother    Heart attack Mother    Stroke Father    Heart attack Father    Heart attack Daughter    Heart attack Brother    Heart attack Maternal Grandmother    Heart attack Paternal Grandmother    Social History   Occupational History   Occupation: financial trader  Tobacco Use   Smoking status: Never   Smokeless tobacco: Never  Vaping Use   Vaping status: Never Used  Substance and Sexual Activity   Alcohol use: No   Drug use: Never   Sexual activity: Not Currently   Tobacco Counseling Counseling given: Not Answered  SDOH Screenings   Food Insecurity: No Food  Insecurity (02/15/2024)  Housing: Unknown (02/15/2024)  Transportation Needs: No Transportation Needs (02/15/2024)  Utilities: Not At Risk (02/15/2024)  Alcohol Screen: Low Risk  (02/15/2024)  Depression (PHQ2-9): Low Risk  (02/15/2024)  Financial Resource Strain: Low Risk  (02/15/2024)  Physical Activity: Sufficiently Active (02/15/2024)  Social Connections: Moderately Isolated (02/15/2024)  Stress: Stress Concern Present (02/15/2024)  Tobacco Use: Low Risk  (02/15/2024)  Health Literacy: Adequate Health Literacy (02/15/2024)   See flowsheets for full screening details  Depression Screen PHQ 2 & 9 Depression Scale- Over the past 2 weeks, how often have you been bothered by any of the following problems? Little interest or pleasure in doing things: 0 Feeling down, depressed, or hopeless (PHQ Adolescent also includes...irritable): 0 PHQ-2 Total Score: 0 Trouble falling or staying asleep, or sleeping too much: 1 Feeling tired or having little energy: 0 Poor appetite or overeating (PHQ Adolescent also includes...weight loss): 0 Feeling bad about yourself - or that you are a failure or have let yourself or your family down: 0 Trouble concentrating on things, such as reading the newspaper or watching television (PHQ Adolescent also includes...like school work): 0 Moving or speaking so slowly that other people could have noticed. Or the opposite - being so fidgety or restless that you have been moving around a lot more than usual: 0 Thoughts that  you would be better off dead, or of hurting yourself in some way: 0 PHQ-9 Total Score: 1 If you checked off any problems, how difficult have these problems made it for you to do your work, take care of things at home, or get along with other people?: Not difficult at all     Goals Addressed             This Visit's Progress    Patient Stated       02/15/2024, wants knee to get better       Visit info / Clinical Intake: Medicare  Wellness Visit Type:: Subsequent Annual Wellness Visit Persons participating in visit:: patient Medicare Wellness Visit Mode:: Telephone If telephone:: video error Because this visit was a virtual/telehealth visit:: unable to obtan vitals due to lack of equipment If Telephone or Video please confirm:: I connected with the patient using audio enabled telemedicine application and verified that I am speaking with the correct person using two identifiers; I discussed the limitations of evaluation and management by telemedicine; The patient expressed understanding and agreed to proceed Patient Location:: home Provider Location:: office Information given by:: patient Interpreter Needed?: No Pre-visit prep was completed: yes AWV questionnaire completed by patient prior to visit?: no Living arrangements:: lives with spouse/significant other Patient's Overall Health Status Rating: very good Typical amount of pain: some Does pain affect daily life?: no Are you currently prescribed opioids?: no  Dietary Habits and Nutritional Risks How many meals a day?: 2 Eats fruit and vegetables daily?: yes Most meals are obtained by: preparing own meals In the last 2 weeks, have you had any of the following?: (!) nausea, vomiting, diarrhea (has chronic diarrhea) Diabetic:: (!) yes Any non-healing wounds?: no How often do you check your BS?: 1 Would you like to be referred to a Nutritionist or for Diabetic Management? : no  Functional Status Activities of Daily Living (to include ambulation/medication): Independent Ambulation: Independent Medication Administration: Independent Home Management: Independent Manage your own finances?: yes Primary transportation is: driving Concerns about vision?: no *vision screening is required for WTM* Concerns about hearing?: no  Fall Screening Falls in the past year?: 1 (tripped) Number of falls in past year: 0 Was there an injury with Fall?: 0 Fall Risk Category  Calculator: 1 Patient Fall Risk Level: Low Fall Risk  Fall Risk Patient at Risk for Falls Due to: Medication side effect; History of fall(s) Fall risk Follow up: Falls prevention discussed; Falls evaluation completed  Home and Transportation Safety: All rugs have non-skid backing?: N/A, no rugs All stairs or steps have railings?: yes Grab bars in the bathtub or shower?: yes Have non-skid surface in bathtub or shower?: yes Good home lighting?: yes Regular seat belt use?: yes Hospital stays in the last year:: no  Cognitive Assessment Difficulty concentrating, remembering, or making decisions? : no Will 6CIT or Mini Cog be Completed: yes What year is it?: 0 points What month is it?: 0 points Give patient an address phrase to remember (5 components): [address] About what time is it?: 0 points Count backwards from 20 to 1: 0 points Say the months of the year in reverse: 0 points Repeat the address phrase from earlier: 0 points 6 CIT Score: 0 points  Advance Directives (For Healthcare) Does Patient Have a Medical Advance Directive?: Yes Does patient want to make changes to medical advance directive?: No - Patient declined Type of Advance Directive: Healthcare Power of [place]; Living will Copy of Healthcare  Power of Attorney in Chart?: No - copy requested Copy of Living Will in Chart?: No - copy requested  Reviewed/Updated  Reviewed/Updated: Reviewed All (Medical, Surgical, Family, Medications, Allergies, Care Teams, Patient Goals)        Objective:    Today's Vitals   There is no height or weight on file to calculate BMI.  Current Medications (verified) Outpatient Encounter Medications as of 02/15/2024  Medication Sig   Accu-Chek Softclix Lancets lancets Use as instructed to check blood sugars 1 time per day dx: e11.9   Alcohol Swabs  (B-D SINGLE USE SWABS  BUTTERFLY) PADS 100 Pieces by Does not apply route as needed.   amLODipine  (NORVASC ) 5 MG tablet TAKE 1  TABLET BY MOUTH EVERY EVENING   aspirin  EC 81 MG tablet Take 1 tablet (81 mg total) by mouth 2 (two) times daily. To prevent blood clots for 30 days after surgery.   atorvastatin  (LIPITOR) 40 MG tablet TAKE 1 TABLET EVERY NIGHT   b complex vitamins capsule Take 1 capsule by mouth daily.   Blood Glucose Monitoring Suppl (ACCU-CHEK GUIDE) w/Device KIT Use to check blood sugars once daily E11.9   celecoxib  (CELEBREX ) 100 MG capsule Take 100 mg by mouth 2 (two) times daily.   chlorhexidine  (HIBICLENS ) 4 % external liquid Apply 15 mLs (1 Application total) topically as directed for 30 doses. Use as directed daily for 5 days every other week for 6 weeks.   cycloSPORINE (RESTASIS) 0.05 % ophthalmic emulsion Place 1 drop into both eyes 2 (two) times daily.   diclofenac Sodium (VOLTAREN ARTHRITIS PAIN) 1 % GEL Apply 2 g topically daily as needed (Pain).   glucose blood (ACCU-CHEK AVIVA PLUS) test strip Use as instructed to check blood sugars 1 time per day dx: e11.22   glucose blood (ACCU-CHEK GUIDE) test strip Use as instructed to check blood sugars once daily E11.9   losartan  (COZAAR ) 50 MG tablet Take 1 tablet (50 mg total) by mouth daily.   Menthol, Topical Analgesic, (BIOFREEZE COOL THE PAIN LARGE) 5 % PTCH Place 1 patch onto the skin daily as needed (pain.).   metFORMIN  (GLUCOPHAGE -XR) 750 MG 24 hr tablet Take 2 tablets (1,500 mg total) by mouth daily after supper.   mupirocin  ointment (BACTROBAN ) 2 % Apply 1 Application topically 2 (two) times daily.   oxymetazoline (AFRIN) 0.05 % nasal spray Place 1 spray into both nostrils at bedtime as needed for congestion.   sitaGLIPtin  (JANUVIA ) 100 MG tablet Take 1 tablet (100 mg total) by mouth daily.   venlafaxine  (EFFEXOR ) 37.5 MG tablet TAKE 1 TABLET BY MOUTH EVERY DAY   fluconazole  (DIFLUCAN ) 150 MG tablet Take one tab po today, repeat in 2 days (Patient not taking: Reported on 02/15/2024)   HYDROcodone -acetaminophen  (NORCO) 10-325 MG tablet Take 1  tablet by mouth every 4 (four) hours as needed for severe pain (pain score 7-10). in knee after surgery (Patient not taking: Reported on 02/15/2024)   hydrOXYzine (ATARAX) 25 MG tablet Take 25 mg by mouth 3 (three) times daily as needed for itching. (Patient not taking: Reported on 02/15/2024)   methocarbamol  (ROBAXIN ) 750 MG tablet Take 1 tablet (750 mg total) by mouth every 8 (eight) hours as needed for muscle spasms. (Patient not taking: Reported on 02/15/2024)   ondansetron  (ZOFRAN -ODT) 4 MG disintegrating tablet Take 1 tablet (4 mg total) by mouth every 8 (eight) hours as needed for nausea or vomiting. (Patient not taking: Reported on 02/15/2024)   No facility-administered encounter medications on file as of  02/15/2024.   Hearing/Vision screen Hearing Screening - Comments:: Denies hearing issues Vision Screening - Comments:: Regular eye exams, [facility] Immunizations and Health Maintenance Health Maintenance  Topic Date Due   COVID-19 Vaccine (5 - 2025-26 season) 03/02/2024 (Originally 12/05/2023)   Diabetic kidney evaluation - Urine ACR  05/17/2024   FOOT EXAM  05/17/2024   HEMOGLOBIN A1C  06/24/2024   OPHTHALMOLOGY EXAM  12/11/2024   Diabetic kidney evaluation - eGFR measurement  12/25/2024   Medicare Annual Wellness (AWV)  02/14/2025   DTaP/Tdap/Td (2 - Td or Tdap) 05/11/2032   Pneumococcal Vaccine: 50+ Years  Completed   Influenza Vaccine  Completed   DEXA SCAN  Completed   Hepatitis C Screening  Completed   Zoster Vaccines- Shingrix   Completed   Meningococcal B Vaccine  Aged Out   Mammogram  Discontinued   Colonoscopy  Discontinued   Fecal DNA (Cologuard)  Discontinued        Assessment/Plan:  This is a routine wellness examination for Virginia Russell.  Patient Care Team: Jarold Medici, MD as PCP - General (Internal Medicine) Lavona Agent, MD as PCP - Cardiology (Cardiology)  I have personally reviewed and noted the following in the patient's chart:   Medical and  social history Use of alcohol, tobacco or illicit drugs  Current medications and supplements including opioid prescriptions. Functional ability and status Nutritional status Physical activity Advanced directives List of other physicians Hospitalizations, surgeries, and ER visits in previous 12 months Vitals Screenings to include cognitive, depression, and falls Referrals and appointments  No orders of the defined types were placed in this encounter.  In addition, I have reviewed and discussed with patient certain preventive protocols, quality metrics, and best practice recommendations. A written personalized care plan for preventive services as well as general preventive health recommendations were provided to patient.   Ardella FORBES Dawn, LPN   88/87/7974   Return in 1 year (on 02/14/2025).  After Visit Summary: (MyChart) Due to this being a telephonic visit, the after visit summary with patients personalized plan was offered to patient via MyChart   Nurse Notes: Declines covid vaccine.

## 2024-02-28 ENCOUNTER — Telehealth: Payer: Self-pay | Admitting: Pharmacist

## 2024-02-28 DIAGNOSIS — M1711 Unilateral primary osteoarthritis, right knee: Secondary | ICD-10-CM

## 2024-02-28 MED ORDER — TIZANIDINE HCL 4 MG PO TABS: 4.0000 mg | ORAL_TABLET | Freq: Every day | ORAL | 0 refills | Status: DC

## 2024-02-28 NOTE — Progress Notes (Signed)
 02/28/2024 Name: Virginia Russell [facility] MRN: [id number] DOB: Aug 01, 1945  Chief Complaint  Patient presents with   Medication Assistance    Rybelsus     Kloi Brodman [facility] is a 78 y.o. year old female who presented for a telephone visit.   They were referred to the pharmacist by their PCP for assistance in managing medication access.  (Rybelsus)   Subjective:  Virginia Russell is a 78 year old female with multiple medical conditions including but not limited to:  hyperlipidemia, hypertension, osteopenia after menopause, osteoarthritis of the right knee, and bilateral Caroid stenosis.  Care Team: Primary Care Provider: Jarold Medici, MD ; Next Scheduled Visit: 05/21/2024  Medication Access/Adherence  Current Pharmacy:  [facility] [address] [place] - [address] [place] [address] Phone: [phone] Fax: [phone]  [facility] - [place], [place] - [address] [place] [address] Phone: [phone] Fax: [phone]   Patient reports affordability concerns with their medications: Yes  Patient reports access/transportation concerns to their pharmacy: No  Patient reports adherence concerns with their medications:  No      Diabetes:  Current medications:     Metformin  XR 750 mg Sitagliptin  100 mg daily     Patient denies hypoglycemic s/sx including  dizziness, shakiness, sweating. Patient denies hyperglycemic symptoms including  polyuria, polydipsia, polyphagia, nocturia, neuropathy, blurred vision.    Macrovascular and Microvascular Risk Reduction:  Statin? yes (Atorvastatin  40 mg 1 tablet dailly #90 last filled 12/25/23); ACEi/ARB? yes (Losartan  50mg  1 tablet daily #90 last filled 12/25/23) Last urinary albumin/creatinine ratio:  Lab Results  Component Value Date   MICRALBCREAT 10 05/18/2023   MICRALBCREAT 17 05/11/2022   MICRALBCREAT 14 02/23/2022   MICRALBCREAT <30  03/25/2021   MICRALBCREAT <30 mg/g 03/07/2019   Last eye exam:  Lab Results  Component Value Date   HMDIABEYEEXA No Retinopathy 12/12/2023   Last foot exam: No foot exam found Tobacco Use:  Tobacco Use: Low Risk  (02/15/2024)   Patient History    Smoking Tobacco Use: Never    Smokeless Tobacco Use: Never    Passive Exposure: Not on file     Objective:  Lab Results  Component Value Date   HGBA1C 8.3 (H) 12/26/2023    Lab Results  Component Value Date   CREATININE 0.59 12/26/2023   BUN 21 12/26/2023   NA 141 12/26/2023   K 4.5 12/26/2023   CL 100 12/26/2023   CO2 25 12/26/2023    Lab Results  Component Value Date   CHOL 154 12/26/2023   HDL 54 12/26/2023   LDLCALC 76 12/26/2023   TRIG 136 12/26/2023   CHOLHDL 2.9 12/26/2023    Medications Reviewed Today     Reviewed by Jolee Cassius PARAS, RPH (Pharmacist) on 02/28/24 at 1421  Med List Status: <None>   Medication Order Taking? Sig Documenting Provider Last Dose Status Informant  Accu-Chek Softclix Lancets lancets [id number] Yes Use as instructed to check blood sugars 1 time per day dx: e11.9 Jarold Medici, MD  Active Self  Alcohol Swabs  (B-D SINGLE USE SWABS  BUTTERFLY) PADS [id number] Yes 100 Pieces by Does not apply route as needed. Jarold Medici, MD  Active Self  amLODipine  (NORVASC ) 5 MG tablet [id number] Yes TAKE 1 TABLET BY MOUTH EVERY KARNA Jarold Medici, MD  Active   aspirin  EC 81 MG tablet [id number] Yes Take 1 tablet (81 mg total) by mouth 2 (two) times daily. To prevent blood clots for 30  days after surgery. Gawne, Meghan M, PA-C  Active   atorvastatin  (LIPITOR) 40 MG tablet [id number] Yes TAKE 1 TABLET EVERY NIGHT Jarold Medici, MD  Active Self  b complex vitamins capsule [id number] Yes Take 1 capsule by mouth daily. [provider]  Active Self  Blood Glucose Monitoring Suppl (ACCU-CHEK GUIDE) w/Device KIT [id number] Yes Use to check blood sugars once daily E11.9 Jarold Medici, MD  Active Self   celecoxib  (CELEBREX ) 100 MG capsule [id number] Yes Take 100 mg by mouth 2 (two) times daily. [provider]  Active Self  chlorhexidine  (HIBICLENS ) 4 % external liquid [id number] Yes Apply 15 mLs (1 Application total) topically as directed for 30 doses. Use as directed daily for 5 days every other week for 6 weeks. Gawne, Meghan M, PA-C  Active   cycloSPORINE (RESTASIS) 0.05 % ophthalmic emulsion [id number] Yes Place 1 drop into both eyes 2 (two) times daily. [provider]  Active Self  diclofenac Sodium (VOLTAREN ARTHRITIS PAIN) 1 % GEL [id number] Yes Apply 2 g topically daily as needed (Pain). [provider]  Active Self   Patient not taking:   Discontinued 02/28/24 1417 (Completed Course)   FLUZONE HIGH-DOSE 0.5 ML injection [id number] Yes  [provider]  Active   glucose blood (ACCU-CHEK AVIVA PLUS) test strip [id number] Yes Use as instructed to check blood sugars 1 time per day dx: e11.22 Jarold Medici, MD  Active Self  glucose blood (ACCU-CHEK GUIDE) test strip [id number] Yes Use as instructed to check blood sugars once daily E11.9 Jarold Medici, MD  Active Self   Patient not taking:   Discontinued 02/28/24 1417 (Completed Course)    Patient not taking:   Discontinued 02/28/24 1418 (Completed Course)   losartan  (COZAAR ) 50 MG tablet [id number] Yes Take 1 tablet (50 mg total) by mouth daily. Jarold Medici, MD  Active Self  Menthol, Topical Analgesic, (BIOFREEZE COOL THE PAIN LARGE) 5 % PTCH [id number] Yes Place 1 patch onto the skin daily as needed (pain.). [provider]  Active Self  metFORMIN  (GLUCOPHAGE -XR) 750 MG 24 hr tablet [id number] Yes Take 2 tablets (1,500 mg total) by mouth daily after supper. Jarold Medici, MD  Active Self   Patient not taking:   Discontinued 02/28/24 1419   mupirocin  ointment (BACTROBAN ) 2 % [id number] Yes Apply 1 Application topically 2 (two) times daily. Jarold Medici, MD  Active    Patient not taking:   Discontinued  02/28/24 1419 (Completed Course)   oxymetazoline (AFRIN) 0.05 % nasal spray [id number] Yes Place 1 spray into both nostrils at bedtime as needed for congestion. [provider]  Active Self  sitaGLIPtin  (JANUVIA ) 100 MG tablet [id number] Yes Take 1 tablet (100 mg total) by mouth daily. Jarold Medici, MD  Active Self           Med Note MACK, CASSIUS PARAS   Tue Feb 28, 2024  2:21 PM) GETS THROUGH PATIENT ASSISTANCE  venlafaxine  (EFFEXOR ) 37.5 MG tablet [id number]  TAKE 1 TABLET BY MOUTH EVERY DAY Sanders, Robyn, MD  Active Self              02/15/2024    2:35 PM 12/26/2023   11:04 AM 10/04/2023   12:12 PM  Vitals with BMI  Height -- 5' 1   Weight -- 136 lbs   BMI  25.71   Systolic -- 110 144  Diastolic -- 78 63  Pulse  85 76      Assessment/Plan:   Diabetes: - Currently uncontrolled; goal  A1c <7%. Cardiorenal risk reduction is optimized.. Blood pressure is at goal <130/80. LDL is at goal.  - Patient's A1c increased from 7.2 to 6.3%. In response, Dr. Jarold recommended the patient start Rybelsus, Farxiga or Jardiance.  One of the CMA's from the practice called the Patient on 01/19/24 to discuss additional therapy and the Patient told her she did not want to start anything new before the new year.  I reviewed the medications Dr. Jarold suggested on the lab note mentioning that Dr. Jarold had asked me to look into patient assistance for Rybelsus for the Patient. Rybelsus will not be offered through the [employer] Patient Assistance Program for 2026.  Patient's household finances were reviewed. They are over income for the LIS/Extra Help Program.  She appears to qualify for both Farxiga and Jardiance through their respective programs.  Patient requested a refill on Tizanidine  4 mg 1 tablet at bedtime.  She reported that she no longer takes methocarbamol .  Tizanidine  had been discontinued from her medication list when methocarbamol  was prescribed.  Patient said she prefers  Tizanidine  and requested a refill.  She said she has still been taking Tizanidine  and had not been taking methocarbamol .   Follow Up Plan:    A prescription of Tizanidine  was pended and sent to Dr. Jarold for review. I will follow up with the Patient prior to her upcoming appointment with Dr. Jarold in February.   I will gladly help Dr. Jarold initiate therapy with Farxiga or Jardiance in the meantime.  Cassius DOROTHA Brought, PharmD, BCACP Clinical Pharmacist [phone]

## 2024-03-02 ENCOUNTER — Telehealth: Payer: Self-pay

## 2024-03-02 NOTE — Telephone Encounter (Signed)
 PAP: Patient assistance application for Januvia through [employer] has been mailed to pt's home address on file. Provider portion of application will be faxed to provider's office.

## 2024-03-07 ENCOUNTER — Other Ambulatory Visit: Payer: Self-pay | Admitting: Internal Medicine

## 2024-03-07 ENCOUNTER — Encounter: Payer: Self-pay | Admitting: Pharmacist

## 2024-03-07 NOTE — Progress Notes (Signed)
   03/07/2024  Patient ID: [facility], female   DOB: 09-09-1945, 78 y.o.   MRN: [id number]  Renewal application completed and Provider signature obtained for Januvia  through [facility] Patient Assistance Program.  Faxed form back to Luke Mall, CPhT for processing and scanning.  Lab Results  Component Value Date   HGBA1C 8.3 (H) 12/26/2023   HGBA1C 7.4 (H) 09/27/2023   HGBA1C 7.6 (H) 08/22/2023   Patient declined starting new therapy as recommended by her PCP will follow up in 1 month.   Cassius DOROTHA Brought, PharmD, BCACP Clinical Pharmacist [phone]

## 2024-03-08 ENCOUNTER — Other Ambulatory Visit (HOSPITAL_COMMUNITY): Payer: Self-pay

## 2024-03-08 NOTE — Telephone Encounter (Signed)
 Received Provider portion PAP application Januvia  ([employer])

## 2024-03-25 ENCOUNTER — Other Ambulatory Visit: Payer: Self-pay | Admitting: Internal Medicine

## 2024-03-25 DIAGNOSIS — M1711 Unilateral primary osteoarthritis, right knee: Secondary | ICD-10-CM

## 2024-04-04 NOTE — Telephone Encounter (Signed)
 Reached out to patient regarding PAP application Januvia  if received.  Left HIPAA compliant v/m to return call for assistance.

## 2024-04-06 NOTE — Telephone Encounter (Signed)
 PAP: Application for Januvia  has been submitted to [employer], via fax w POI document.

## 2024-05-21 ENCOUNTER — Encounter: Payer: Self-pay | Admitting: Internal Medicine

## 2025-03-13 ENCOUNTER — Ambulatory Visit
